# Patient Record
Sex: Female | Born: 1985 | Hispanic: No | Marital: Married | Smoking: Never smoker
Health system: Southern US, Community
[De-identification: ages and names within clinical notes are randomized; demographics above are authoritative.]

## PROBLEM LIST (undated history)

## (undated) ENCOUNTER — Emergency Department: Disposition: A | Discharge status: Home / Self Care | Payer: BLUE CROSS/BLUE SHIELD

## (undated) DIAGNOSIS — Z8632 Personal history of gestational diabetes: Secondary | ICD-10-CM

## (undated) DIAGNOSIS — T7840XA Allergy, unspecified, initial encounter: Secondary | ICD-10-CM

## (undated) DIAGNOSIS — E88819 Insulin resistance, unspecified: Secondary | ICD-10-CM

## (undated) DIAGNOSIS — O24419 Gestational diabetes mellitus in pregnancy, unspecified control: Secondary | ICD-10-CM

## (undated) DIAGNOSIS — Z789 Other specified health status: Secondary | ICD-10-CM

## (undated) DIAGNOSIS — R079 Chest pain, unspecified: Secondary | ICD-10-CM

## (undated) HISTORY — DX: Allergy, unspecified, initial encounter: T78.40XA

## (undated) HISTORY — DX: Body Mass Index (BMI) 40.0 and over, adult: Z684

## (undated) HISTORY — DX: Insulin resistance, unspecified: E88.819

## (undated) HISTORY — DX: Chest pain, unspecified: R07.9

## (undated) HISTORY — PX: NO PAST SURGERIES: SHX2092

## (undated) HISTORY — DX: Morbid (severe) obesity due to excess calories: E66.01

## (undated) HISTORY — DX: Personal history of gestational diabetes: Z86.32

---

## 2015-05-27 ENCOUNTER — Encounter (HOSPITAL_COMMUNITY): Payer: Self-pay | Admitting: *Deleted

## 2015-05-27 ENCOUNTER — Inpatient Hospital Stay (HOSPITAL_COMMUNITY)
Admission: AD | Admit: 2015-05-27 | Discharge: 2015-05-27 | Disposition: A | Discharge status: Home / Self Care | Payer: Medicaid Other | Source: Ambulatory Visit | Attending: Family Medicine | Admitting: Family Medicine

## 2015-05-27 DIAGNOSIS — N921 Excessive and frequent menstruation with irregular cycle: Secondary | ICD-10-CM | POA: Diagnosis present

## 2015-05-27 DIAGNOSIS — N939 Abnormal uterine and vaginal bleeding, unspecified: Secondary | ICD-10-CM | POA: Diagnosis not present

## 2015-05-27 HISTORY — DX: Other specified health status: Z78.9

## 2015-05-27 LAB — CBC WITH DIFFERENTIAL/PLATELET
BASOS ABS: 0 10*3/uL (ref 0.0–0.1)
BASOS PCT: 1 % (ref 0–1)
Eosinophils Absolute: 0.4 10*3/uL (ref 0.0–0.7)
Eosinophils Relative: 5 % (ref 0–5)
HEMATOCRIT: 38.6 % (ref 36.0–46.0)
HEMOGLOBIN: 12.4 g/dL (ref 12.0–15.0)
LYMPHS PCT: 32 % (ref 12–46)
Lymphs Abs: 2.6 10*3/uL (ref 0.7–4.0)
MCH: 26.5 pg (ref 26.0–34.0)
MCHC: 32.1 g/dL (ref 30.0–36.0)
MCV: 82.5 fL (ref 78.0–100.0)
MONO ABS: 0.5 10*3/uL (ref 0.1–1.0)
Monocytes Relative: 6 % (ref 3–12)
NEUTROS ABS: 4.8 10*3/uL (ref 1.7–7.7)
NEUTROS PCT: 56 % (ref 43–77)
Platelets: 214 10*3/uL (ref 150–400)
RBC: 4.68 MIL/uL (ref 3.87–5.11)
RDW: 14.4 % (ref 11.5–15.5)
WBC: 8.4 10*3/uL (ref 4.0–10.5)

## 2015-05-27 LAB — WET PREP, GENITAL
CLUE CELLS WET PREP: NONE SEEN
TRICH WET PREP: NONE SEEN
YEAST WET PREP: NONE SEEN

## 2015-05-27 LAB — URINE MICROSCOPIC-ADD ON

## 2015-05-27 LAB — URINALYSIS, ROUTINE W REFLEX MICROSCOPIC
Bilirubin Urine: NEGATIVE
GLUCOSE, UA: NEGATIVE mg/dL
KETONES UR: NEGATIVE mg/dL
NITRITE: NEGATIVE
PROTEIN: NEGATIVE mg/dL
Specific Gravity, Urine: 1.02 (ref 1.005–1.030)
Urobilinogen, UA: 0.2 mg/dL (ref 0.0–1.0)
pH: 7 (ref 5.0–8.0)

## 2015-05-27 LAB — POCT PREGNANCY, URINE: Preg Test, Ur: NEGATIVE

## 2015-05-27 NOTE — MAU Provider Note (Signed)
History     CSN: [id number]  Arrival date and time: 05/27/15 1644   First Provider Initiated Contact with Patient 05/27/15 1735      Chief Complaint  Patient presents with  . Metrorrhagia   HPI Ms. Brittany Fritz is a 29 y.o. G1P1 who presents to MAU today with complaint of vaginal bleeding x 17 days. The patient states that initially bleeding was very light and then she had heavy bleeding x 7 days. Today she denies vaginal bleeding. She also denies abdominal pain or fever.   OB History    Gravida Para Term Preterm AB TAB SAB Ectopic Multiple Living   Past Medical History  Diagnosis Date  . Medical history non-contributory     Past Surgical History  Procedure Laterality Date  . No past surgeries      History reviewed. No pertinent family history.  Social History  Substance Use Topics  . Smoking status: Never Smoker   . Smokeless tobacco: None  . Alcohol Use: No    Allergies:  Allergies  Allergen Reactions  . Penicillins Itching and Rash    No prescriptions prior to admission    Review of Systems  Constitutional: Negative for fever and malaise/fatigue.  Gastrointestinal: Negative for abdominal pain.  Genitourinary:       Neg - vaginal bleeding, discharge   Physical Exam   Blood pressure 117/74, pulse 94, temperature 98.9 F (37.2 C), resp. rate 18, height 5' 2.5" (1.588 m), weight 189 lb (85.73 kg), last menstrual period 05/10/2015.  Physical Exam  Nursing note and vitals reviewed. Constitutional: She is oriented to person, place, and time. She appears well-developed and well-nourished. No distress.  HENT:  Head: Normocephalic and atraumatic.  Cardiovascular: Normal rate.   Respiratory: Effort normal.  GI: Soft. She exhibits no distension and no mass. There is no tenderness. There is no rebound and no guarding.  Genitourinary: Uterus is not enlarged and not tender. Cervix exhibits no motion tenderness, no discharge and no  friability. Right adnexum displays no mass and no tenderness. Left adnexum displays no mass and no tenderness. There is bleeding (scant brown) in the vagina. Vaginal discharge (scant thin, light brown discharge) found.  Neurological: She is alert and oriented to person, place, and time.  Skin: Skin is warm and dry. No erythema.  Psychiatric: She has a normal mood and affect.   Results for orders placed or performed during the hospital encounter of 05/27/15 (from the past 24 hour(s))  Urinalysis, Routine w reflex microscopic (not at [facility])     Status: Abnormal   Collection Time: 05/27/15  5:15 PM  Result Value Ref Range   Color, Urine YELLOW YELLOW   APPearance HAZY (A) CLEAR   Specific Gravity, Urine 1.020 1.005 - 1.030   pH 7.0 5.0 - 8.0   Glucose, UA NEGATIVE NEGATIVE mg/dL   Hgb urine dipstick LARGE (A) NEGATIVE   Bilirubin Urine NEGATIVE NEGATIVE   Ketones, ur NEGATIVE NEGATIVE mg/dL   Protein, ur NEGATIVE NEGATIVE mg/dL   Urobilinogen, UA 0.2 0.0 - 1.0 mg/dL   Nitrite NEGATIVE NEGATIVE   Leukocytes, UA MODERATE (A) NEGATIVE  Urine microscopic-add on     Status: Abnormal   Collection Time: 05/27/15  5:15 PM  Result Value Ref Range   Squamous Epithelial / LPF MANY (A) RARE   WBC, UA 21-50 <3 WBC/hpf   RBC / HPF 3-6 <3 RBC/hpf  Bacteria, UA MANY (A) RARE  Pregnancy, urine POC     Status: None   Collection Time: 05/27/15  5:21 PM  Result Value Ref Range   Preg Test, Ur NEGATIVE NEGATIVE  CBC with Differential/Platelet     Status: None   Collection Time: 05/27/15  5:31 PM  Result Value Ref Range   WBC 8.4 4.0 - 10.5 K/uL   RBC 4.68 3.87 - 5.11 MIL/uL   Hemoglobin 12.4 12.0 - 15.0 g/dL   HCT 16.1 09.6 - 04.5 %   MCV 82.5 78.0 - 100.0 fL   MCH 26.5 26.0 - 34.0 pg   MCHC 32.1 30.0 - 36.0 g/dL   RDW 40.9 81.1 - 91.4 %   Platelets 214 150 - 400 K/uL   Neutrophils Relative % 56 43 - 77 %   Neutro Abs 4.8 1.7 - 7.7 K/uL   Lymphocytes Relative 32 12 - 46 %   Lymphs Abs 2.6 0.7  - 4.0 K/uL   Monocytes Relative 6 3 - 12 %   Monocytes Absolute 0.5 0.1 - 1.0 K/uL   Eosinophils Relative 5 0 - 5 %   Eosinophils Absolute 0.4 0.0 - 0.7 K/uL   Basophils Relative 1 0 - 1 %   Basophils Absolute 0.0 0.0 - 0.1 K/uL  Wet prep, genital     Status: Abnormal   Collection Time: 05/27/15  5:40 PM  Result Value Ref Range   Yeast Wet Prep HPF POC NONE SEEN NONE SEEN   Trich, Wet Prep NONE SEEN NONE SEEN   Clue Cells Wet Prep HPF POC NONE SEEN NONE SEEN   WBC, Wet Prep HPF POC MODERATE (A) NONE SEEN    MAU Course  Procedures None  MDM UPT - negative CBC, wet prep and GC/Chlamydia today Patient is hemodynamically stable. Since bleeding has stopped today will give contact information for [facility] if irregular bleeding continues.  Patient asymptomatic of UTI, urine sample with many epithelial cells, will culture and call if warrants treatment.  Assessment and Plan  A: Irregular bleeding  P: Discharge home Bleeding precautions discussed Patient advised to follow-up with [facility] as needed if symptoms persist or worsen Patient may return to MAU as needed or if her condition were to change or worsen  Marny Lowenstein, PA-C  05/27/2015, 6:15 PM

## 2015-05-27 NOTE — MAU Note (Signed)
[place] 7[place]

## 2015-05-27 NOTE — MAU Note (Addendum)
Problem with period.   Had period last month on the 27th, started bleeding again on 8/12, stopped yesterday. 21-20 was light. 21-27 was very heavy.  Passing clots.  Never had anything like this before

## 2015-05-27 NOTE — Discharge Instructions (Signed)
Abnormal Uterine Bleeding Abnormal uterine bleeding can affect women at various stages in life, including teenagers, women in their reproductive years, pregnant women, and women who have reached menopause. Several kinds of uterine bleeding are considered abnormal, including:  Bleeding or spotting between periods.   Bleeding after sexual intercourse.   Bleeding that is heavier or more than normal.   Periods that last longer than usual.  Bleeding after menopause.  Many cases of abnormal uterine bleeding are minor and simple to treat, while others are more serious. Any type of abnormal bleeding should be evaluated by your health care provider. Treatment will depend on the cause of the bleeding. HOME CARE INSTRUCTIONS Monitor your condition for any changes. The following actions may help to alleviate any discomfort you are experiencing:  Avoid the use of tampons and douches as directed by your health care provider.  Change your pads frequently. You should get regular pelvic exams and Pap tests. Keep all follow-up appointments for diagnostic tests as directed by your health care provider.  SEEK MEDICAL CARE IF:   Your bleeding lasts more than 1 week.   You feel dizzy at times.  SEEK IMMEDIATE MEDICAL CARE IF:   You pass out.   You are changing pads every 15 to 30 minutes.   You have abdominal pain.  You have a fever.   You become sweaty or weak.   You are passing large blood clots from the vagina.   You start to feel nauseous and vomit. MAKE SURE YOU:   Understand these instructions.  Will watch your condition.  Will get help right away if you are not doing well or get worse. Document Released: 09/14/2005 Document Revised: 09/19/2013 Document Reviewed: 04/13/2013 [employer] Patient Information 2015 [employer]. This information is not intended to replace advice given to you by your health care provider. Make sure you discuss any questions you have with your  health care provider.  

## 2015-05-28 LAB — GC/CHLAMYDIA PROBE AMP (~~LOC~~) NOT AT ARMC
CHLAMYDIA, DNA PROBE: NEGATIVE
NEISSERIA GONORRHEA: NEGATIVE

## 2015-05-28 LAB — URINE CULTURE

## 2015-10-30 ENCOUNTER — Encounter (HOSPITAL_COMMUNITY): Payer: Self-pay | Admitting: *Deleted

## 2015-10-30 ENCOUNTER — Inpatient Hospital Stay (HOSPITAL_COMMUNITY)
Admission: AD | Admit: 2015-10-30 | Discharge: 2015-10-30 | Disposition: A | Discharge status: Home / Self Care | Payer: Medicaid Other | Source: Ambulatory Visit | Attending: Obstetrics and Gynecology | Admitting: Obstetrics and Gynecology

## 2015-10-30 DIAGNOSIS — N939 Abnormal uterine and vaginal bleeding, unspecified: Secondary | ICD-10-CM | POA: Diagnosis present

## 2015-10-30 DIAGNOSIS — Z880 Allergy status to penicillin: Secondary | ICD-10-CM | POA: Diagnosis not present

## 2015-10-30 LAB — URINE MICROSCOPIC-ADD ON: Bacteria, UA: NONE SEEN

## 2015-10-30 LAB — CBC
HCT: 39.1 % (ref 36.0–46.0)
HEMOGLOBIN: 12.8 g/dL (ref 12.0–15.0)
MCH: 26.1 pg (ref 26.0–34.0)
MCHC: 32.7 g/dL (ref 30.0–36.0)
MCV: 79.8 fL (ref 78.0–100.0)
Platelets: 221 10*3/uL (ref 150–400)
RBC: 4.9 MIL/uL (ref 3.87–5.11)
RDW: 14.5 % (ref 11.5–15.5)
WBC: 8.2 10*3/uL (ref 4.0–10.5)

## 2015-10-30 LAB — URINALYSIS, ROUTINE W REFLEX MICROSCOPIC
Bilirubin Urine: NEGATIVE
GLUCOSE, UA: NEGATIVE mg/dL
KETONES UR: NEGATIVE mg/dL
Nitrite: NEGATIVE
PROTEIN: NEGATIVE mg/dL
Specific Gravity, Urine: 1.02 (ref 1.005–1.030)
pH: 7.5 (ref 5.0–8.0)

## 2015-10-30 LAB — WET PREP, GENITAL
Clue Cells Wet Prep HPF POC: NONE SEEN
SPERM: NONE SEEN
Trich, Wet Prep: NONE SEEN
Yeast Wet Prep HPF POC: NONE SEEN

## 2015-10-30 LAB — POCT PREGNANCY, URINE: PREG TEST UR: NEGATIVE

## 2015-10-30 NOTE — Discharge Instructions (Signed)
Abnormal Uterine Bleeding Abnormal uterine bleeding can affect women at various stages in life, including teenagers, women in their reproductive years, pregnant women, and women who have reached menopause. Several kinds of uterine bleeding are considered abnormal, including:  Bleeding or spotting between periods.   Bleeding after sexual intercourse.   Bleeding that is heavier or more than normal.   Periods that last longer than usual.  Bleeding after menopause.  Many cases of abnormal uterine bleeding are minor and simple to treat, while others are more serious. Any type of abnormal bleeding should be evaluated by your health care provider. Treatment will depend on the cause of the bleeding. HOME CARE INSTRUCTIONS Monitor your condition for any changes. The following actions may help to alleviate any discomfort you are experiencing:  Avoid the use of tampons and douches as directed by your health care provider.  Change your pads frequently. You should get regular pelvic exams and Pap tests. Keep all follow-up appointments for diagnostic tests as directed by your health care provider.  SEEK MEDICAL CARE IF:   Your bleeding lasts more than 1 week.   You feel dizzy at times.  SEEK IMMEDIATE MEDICAL CARE IF:   You pass out.   You are changing pads every 15 to 30 minutes.   You have abdominal pain.  You have a fever.   You become sweaty or weak.   You are passing large blood clots from the vagina.   You start to feel nauseous and vomit. MAKE SURE YOU:   Understand these instructions.  Will watch your condition.  Will get help right away if you are not doing well or get worse.   This information is not intended to replace advice given to you by your health care provider. Make sure you discuss any questions you have with your health care provider.   Document Released: 09/14/2005 Document Revised: 09/19/2013 Document Reviewed: 04/13/2013 Elsevier Interactive  Patient Education [employer] Infertility Infertility is when you are unable to get pregnant (conceive) after a year of having sex regularly without using birth control. Infertility can also mean that a woman is not able to carry a pregnancy to full term.  Both women and men can have fertility problems. WHAT CAUSES INFERTILITY? What Causes Infertility in Women? There are many possible causes of infertility in women. For some women, the cause of infertility is not known (unexplained infertility). Infertility can also be linked to more than one cause. Infertility problems in women can be caused by problems with the menstrual cycle or reproductive organs, certain medical conditions, and factors related to lifestyle and age.  Problems with your menstrual cycle can interfere with your ovaries producing eggs (ovulation). This can make it difficult to get pregnant. This includes having a menstrual cycle that is very long, very short, or irregular.  Problems with reproductive organs can include:  An abnormally narrow cervix or a cervix that does not remain closed during a pregnancy.  A blockage in your fallopian tubes.  An abnormally shaped uterus.  Uterine fibroids. This is a tissue mass (tumor) that can develop on your uterus.  Medical conditions that can affect a woman's fertility include:  Polycystic ovarian syndrome (PCOS). This is a hormonal disorder that can cause small cysts to grow on your ovaries. This is the most common cause of infertility in women.  Endometriosis. This is a condition in which the tissue that lines your uterus (endometrium) grows outside of its normal location.  Primary ovary insufficiency. This  is when your ovaries stop producing eggs and hormones before the age of 9.  Sexually transmitted diseases, such as chlamydia or gonorrhea. These infections can cause scarring in your fallopian tubes. This makes it difficult for eggs to reach your uterus.  Autoimmune  disorders. These are disorders in which your immune system attacks normal, healthy cells.  Hormone imbalances.  Other factors include:  Age. A woman's fertility declines with age, especially after her mid-30s.  Being under- or overweight.  Drinking too much alcohol.  Using drugs.  Exercising excessively.  Being exposed to environmental toxins, such as radiation, pesticides, and certain chemicals. What Causes Infertility in Men? There are many causes of infertility in men. Infertility can be linked to more than one cause. Infertility problems in men can be caused by problems with sperm or the reproductive organs, certain medical conditions, and factors related to lifestyle and age. Some men have unexplained infertility.   Problems with sperm. Infertility can result if there is a problem producing:  Enough sperm (low sperm count).  Enough normally-shaped sperm (sperm morphology).  Sperm that are able to reach the egg (poor motility).  Infertility can also be caused by:  A problem with hormones.  Enlarged veins (varicoceles), cysts (spermatoceles), or tumors of the testicles.  Sexual dysfunction.  Injury to the testicles.  A birth defect, such as not having the tubes that carry sperm (vas deferens).  Medical conditions that can affect a man's fertility include:  Diabetes.  Cancer treatments, such as chemotherapy or radiation.  Klinefelter syndrome. This is an inherited genetic disorder.   Thyroid problems, such as an under- or overactive thyroid.  Cystic fibrosis.  Sexually transmitted diseases.  Other factors include:  Age. A man's fertility declines with age.  Drinking too much alcohol.  Using drugs.  Being exposed to environmental toxins, such as pesticides and lead. WHAT ARE THE SYMPTOMS OF INFERTILITY? Being unable to get pregnant after one year of having regular sex without using birth control is the only sign of infertility.  HOW IS INFERTILITY  DIAGNOSED? In order to be diagnosed with infertility, both partners will have a physical exam. Both partners will also have an extensive medical and sexual history taken. If there is no obvious reason for infertility, additional tests may be done. What Tests Will Women Have? Women may first have tests to check whether they are ovulating each month. The tests may include:  Blood tests to check hormone levels.  An ultrasound of the ovaries. This looks for possible problems on or in the ovaries.  Taking a small sample of the tissue that lines the uterus for examination under a microscope (endometrial biopsy). Women who are ovulating may have additional tests. These may include:  Hysterosalpingography.  This is an X-ray of the fallopian tubes and uterus taken after a specific type of dye is injected.  This test can show the shape of the uterus and whether the fallopian tubes are open.  Laparoscopy.  In this test, a lighted tube (laparoscope) is used to look for problems in the fallopian tubes and other female organs.  Transvaginal ultrasound.  This is an imaging test to check for abnormalities of the uterus and ovaries.  A health care provider can use this test to count the number of follicles on the ovaries.  Hysteroscopy.  This test involves using a lighted tube to examine the cervix and inside the uterus.  It is done to find any abnormalities inside the uterus. What Tests Will Men Have?  Tests for men's infertility includes:  Semen tests to check sperm count, morphology, and motility.  Blood tests to check for hormone levels.  Taking a small sample of tissue from inside a testicle (biopsy). This is examined under a microscope.  Blood tests to check for genetic abnormalities (genetic testing). HOW ARE WOMEN TREATED FOR INFERTILITY?  Treatment depends on the cause of infertility. Most cases of infertility in women are treated with medicine or surgery.  Women may take  medicine to:  Correct ovulation problems.  Treat other health conditions, such as PCOS.  Surgery may be done to:  Repair damage to the ovaries, fallopian tubes, cervix, or uterus.  Remove growths from the uterus.  Remove scar tissue from the uterus, pelvis, or other female organs. HOW ARE MEN TREATED FOR INFERTILITY?  Treatment depends on the cause of infertility. Most cases of infertility in men are treated with medicine or surgery.   Men may take medicine to:  Correct hormone problems.  Treat other health conditions.  Treat sexual dysfunction.  Surgery may be done to:  Remove blockages in the reproductive tract.  Correct other structural problems of the reproductive tract. WHAT IS ASSISTED REPRODUCTIVE TECHNOLOGY? Assisted reproductive technology (ART) refers to all treatments and procedures that combine eggs and sperm outside the body to try to help a couple conceive. ART is often combined with fertility drugs to stimulate ovulation. Sometimes ART is done using eggs retrieved from another woman's body (donor eggs) or from previously frozen fertilized eggs (embryos).  There are different types of ART. These include:   Intrauterine insemination (IUI).  In this procedure, sperm is placed directly into a woman's uterus with a long, thin tube.  This may be most effective for infertility caused by sperm problems, including low sperm count and low motility.  Can be used in combination with fertility drugs.  In vitro fertilization (IVF).  This is often done when a woman's fallopian tubes are blocked or when a man has low sperm counts.  Fertility drugs stimulate the ovaries to produce multiple eggs. Once mature, these eggs are removed from the body and combined with the sperm to be fertilized.  These fertilized eggs are then placed in the woman's uterus.   This information is not intended to replace advice given to you by your health care provider. Make sure you discuss any  questions you have with your health care provider.   Document Released: 09/17/2003 Document Revised: 06/05/2015 Document Reviewed: 05/30/2014 Elsevier Interactive Patient Education [employer]

## 2015-10-30 NOTE — MAU Provider Note (Signed)
Chief Complaint: Vaginal Bleeding   First Provider Initiated Contact with Patient 10/30/15 1828      SUBJECTIVE HPI: Brittany Fritz is a 31 y.o. G1P1 who presents to maternity admissions reporting 2 periods in January which is unusual for her.  She recently moved here from [place] and desires conception.  She had her period January 25, then started bleeding again today.  She has a 44.28 year old but has not conceived since he was born. She took the hormone prolactin for a short time, stopping 7 months ago to encourage lactation and help with breastfeeding. She also recently received several vaccines, including Hep B, MMR, influenza, and TdaP, as part of obtaining her green card.  She is concerned that the vaccines may have caused her unusual period.   She denies vaginal itching/burning, urinary symptoms, h/a, dizziness, n/v, or fever/chills.     HPI  Past Medical History  Diagnosis Date  . Medical history non-contributory    Past Surgical History  Procedure Laterality Date  . No past surgeries     Social History   Social History  . Marital Status: Married    Spouse Name: N/A  . Number of Children: N/A  . Years of Education: N/A   Occupational History  . Not on file.   Social History Main Topics  . Smoking status: Never Smoker   . Smokeless tobacco: Not on file  . Alcohol Use: No  . Drug Use: No  . Sexual Activity: Yes   Other Topics Concern  . Not on file   Social History Narrative   No current facility-administered medications on file prior to encounter.   No current outpatient prescriptions on file prior to encounter.   Allergies  Allergen Reactions  . Penicillins Itching and Rash    Has patient had a PCN reaction causing immediate rash, facial/tongue/throat swelling, SOB or lightheadedness with hypotension: Yes Has patient had a PCN reaction causing severe rash involving mucus membranes or skin necrosis: No Has patient had a PCN reaction that required hospitalization  No Has patient had a PCN reaction occurring within the last 10 years: Yes If all of the above answers are "NO", then may proceed with Cephalosporin use.     ROS:  Review of Systems  Constitutional: Negative for fever, chills and fatigue.  Respiratory: Negative for shortness of breath.   Cardiovascular: Negative for chest pain.  Genitourinary: Positive for vaginal bleeding. Negative for dysuria, flank pain, vaginal discharge, difficulty urinating, vaginal pain and pelvic pain.  Neurological: Negative for dizziness and headaches.  Psychiatric/Behavioral: Negative.      I have reviewed patient's Past Medical Hx, Surgical Hx, Family Hx, Social Hx, medications and allergies.   Physical Exam  Patient Vitals for the past 24 hrs:  BP Temp Temp src Pulse Resp Height Weight  10/30/15 1857 129/70 mmHg 97.7 F (36.5 C) Oral 90 18 - -  10/30/15 1704 137/78 mmHg 97.7 F (36.5 C) Oral 91 17 5' 4.57" (1.64 m) 200 lb 9.6 oz (90.992 kg)   Constitutional: Well-developed, well-nourished female in no acute distress.  Cardiovascular: normal rate Respiratory: normal effort GI: Abd soft, non-tender. Pos BS x 4 MS: Extremities nontender, no edema, normal ROM Neurologic: Alert and oriented x 4.  GU: Neg CVAT.  PELVIC EXAM: Cervix pink, visually closed, without lesion, scant white creamy discharge, vaginal walls and external genitalia normal Bimanual exam: Cervix 0/long/high, firm, anterior, neg CMT, uterus nontender, nonenlarged, adnexa without tenderness, enlargement, or mass   LAB RESULTS Results  for orders placed or performed during the hospital encounter of 10/30/15 (from the past 24 hour(s))  Urinalysis, Routine w reflex microscopic (not at [facility])     Status: Abnormal   Collection Time: 10/30/15  5:08 PM  Result Value Ref Range   Color, Urine YELLOW YELLOW   APPearance HAZY (A) CLEAR   Specific Gravity, Urine 1.020 1.005 - 1.030   pH 7.5 5.0 - 8.0   Glucose, UA NEGATIVE NEGATIVE mg/dL    Hgb urine dipstick LARGE (A) NEGATIVE   Bilirubin Urine NEGATIVE NEGATIVE   Ketones, ur NEGATIVE NEGATIVE mg/dL   Protein, ur NEGATIVE NEGATIVE mg/dL   Nitrite NEGATIVE NEGATIVE   Leukocytes, UA MODERATE (A) NEGATIVE  Urine microscopic-add on     Status: Abnormal   Collection Time: 10/30/15  5:08 PM  Result Value Ref Range   Squamous Epithelial / LPF 0-5 (A) NONE SEEN   WBC, UA 0-5 0 - 5 WBC/hpf   RBC / HPF 6-30 0 - 5 RBC/hpf   Bacteria, UA NONE SEEN NONE SEEN  Pregnancy, urine POC     Status: None   Collection Time: 10/30/15  5:15 PM  Result Value Ref Range   Preg Test, Ur NEGATIVE NEGATIVE  CBC     Status: None   Collection Time: 10/30/15  6:26 PM  Result Value Ref Range   WBC 8.2 4.0 - 10.5 K/uL   RBC 4.90 3.87 - 5.11 MIL/uL   Hemoglobin 12.8 12.0 - 15.0 g/dL   HCT 39.1 36.0 - 46.0 %   MCV 79.8 78.0 - 100.0 fL   MCH 26.1 26.0 - 34.0 pg   MCHC 32.7 30.0 - 36.0 g/dL   RDW 14.5 11.5 - 15.5 %   Platelets 221 150 - 400 K/uL  Wet prep, genital     Status: Abnormal   Collection Time: 10/30/15  6:30 PM  Result Value Ref Range   Yeast Wet Prep HPF POC NONE SEEN NONE SEEN   Trich, Wet Prep NONE SEEN NONE SEEN   Clue Cells Wet Prep HPF POC NONE SEEN NONE SEEN   WBC, Wet Prep HPF POC MODERATE (A) NONE SEEN   Sperm NONE SEEN        IMAGING No results found.  MAU Management/MDM: Ordered labs and reviewed results.  Pt stable with no evidence of anemia or infection.  Discussed normal menstrual cycle and fertility with pt, recommend ovulation kit. She may f/u with Gyn provider as needed and fertility provider as desired.  Pt given list of OB/Gyn offices in [place].  Pt stable at time of discharge.  ASSESSMENT 1. Abnormal uterine bleeding (AUB)     PLAN Discharge home with bleeding precautions    Medication List    Notice    You have not been prescribed any medications.         Follow-up Information    Please follow up.   Why:  Recommend fertility or ovulation kit  , Return to MAU as needed for emergencies      Fatima Blank Certified Nurse-Midwife 10/30/2015  7:05 PM

## 2015-10-30 NOTE — MAU Note (Signed)
Pt presents to MAU with complaints of having irregular menstrual cycles. Had two cycles in January. Reports lower back and abdominal pain.

## 2015-10-31 LAB — GC/CHLAMYDIA PROBE AMP (~~LOC~~) NOT AT ARMC
CHLAMYDIA, DNA PROBE: NEGATIVE
NEISSERIA GONORRHEA: NEGATIVE

## 2015-10-31 LAB — HIV ANTIBODY (ROUTINE TESTING W REFLEX): HIV Screen 4th Generation wRfx: NONREACTIVE

## 2015-12-26 ENCOUNTER — Ambulatory Visit: Payer: Medicaid Other | Admitting: Certified Nurse Midwife

## 2016-01-23 ENCOUNTER — Encounter: Payer: Self-pay | Admitting: Certified Nurse Midwife

## 2016-01-23 ENCOUNTER — Ambulatory Visit (INDEPENDENT_AMBULATORY_CARE_PROVIDER_SITE_OTHER): Payer: Medicaid Other | Admitting: Certified Nurse Midwife

## 2016-01-23 VITALS — BP 111/76 | HR 80 | Temp 98.3°F | Resp 96 | Wt 204.0 lb

## 2016-01-23 DIAGNOSIS — N939 Abnormal uterine and vaginal bleeding, unspecified: Secondary | ICD-10-CM

## 2016-01-23 DIAGNOSIS — Z0000 Encounter for general adult medical examination without abnormal findings: Secondary | ICD-10-CM | POA: Diagnosis not present

## 2016-01-23 DIAGNOSIS — Z01419 Encounter for gynecological examination (general) (routine) without abnormal findings: Secondary | ICD-10-CM | POA: Diagnosis not present

## 2016-01-23 DIAGNOSIS — Z603 Acculturation difficulty: Secondary | ICD-10-CM | POA: Insufficient documentation

## 2016-01-23 DIAGNOSIS — Z3169 Encounter for other general counseling and advice on procreation: Secondary | ICD-10-CM | POA: Insufficient documentation

## 2016-01-23 DIAGNOSIS — Z7689 Persons encountering health services in other specified circumstances: Secondary | ICD-10-CM

## 2016-01-23 DIAGNOSIS — Z1389 Encounter for screening for other disorder: Secondary | ICD-10-CM

## 2016-01-23 DIAGNOSIS — R319 Hematuria, unspecified: Secondary | ICD-10-CM

## 2016-01-23 LAB — POCT URINALYSIS DIPSTICK
BILIRUBIN UA: NEGATIVE
GLUCOSE UA: NEGATIVE
Ketones, UA: NEGATIVE
Nitrite, UA: NEGATIVE
PH UA: 5
Protein, UA: NEGATIVE
RBC UA: 50
SPEC GRAV UA: 1.015
Urobilinogen, UA: NEGATIVE

## 2016-01-23 MED ORDER — VITAFOL FE+ 90-1-200 & 50 MG PO CPPK
2.0000 | ORAL_CAPSULE | Freq: Every day | ORAL | Status: DC
Start: 2016-01-23 — End: 2016-11-17

## 2016-01-23 NOTE — Progress Notes (Signed)
Patient ID: Brittany Fritz, female   DOB: [birth year], 30 y.o.   MRN: [id number]    Subjective:      Brittany Fritz is a 30 y.o. female here for a routine exam. Here for exam with interpreter.  Current complaints: infertility.  Has a hx of AUB with heavy period in February.  Up to date on immunizations.  Periods are monthly lasting 5 days, denies cramping, does have small clots and heavy bleeding.  Has been trying to get pregnant for 4 months, discussed ways to encourage successful pregnancy.   Patients spouse had sperm count done in [place] and it was normal.  LMP: 12/24/15.    Recent immigration to the [place] from [place], was in [place] for 4 years; [place] refugees.     Personal health questionnaire:  Is patient Ashkenazi Jewish, have a family history of breast and/or ovarian cancer: no Is there a family history of uterine cancer diagnosed at age < 38, gastrointestinal cancer, urinary tract cancer, family member who is a Personnel officer syndrome-associated carrier: no Is the patient overweight and hypertensive, family history of diabetes, personal history of gestational diabetes, preeclampsia or PCOS: yes Is patient over 8, have PCOS,  family history of premature CHD under age 73, diabetes, smoke, have hypertension or peripheral artery disease:  no At any time, has a partner hit, kicked or otherwise hurt or frightened you?: no Over the past 2 weeks, have you felt down, depressed or hopeless?: no Over the past 2 weeks, have you felt little interest or pleasure in doing things?:no   Gynecologic History No LMP recorded. Contraception: none, desires pregnancy Last Pap: unknown. Results were: unknown Last mammogram: N/A.   Obstetric History OB History  Gravida Para Term Preterm AB SAB TAB Ectopic Multiple Living  # Outcome Date GA Lbr Len/2nd Weight Sex Delivery Anes PTL Lv  1 Para             Delivered in 2014 in [place]  Past Medical History  Diagnosis Date  . Medical history  non-contributory   . Allergy     Seasonal    Past Surgical History  Procedure Laterality Date  . No past surgeries      No current outpatient prescriptions on file. Allergies  Allergen Reactions  . Penicillins Itching and Rash    Has patient had a PCN reaction causing immediate rash, facial/tongue/throat swelling, SOB or lightheadedness with hypotension: Yes Has patient had a PCN reaction causing severe rash involving mucus membranes or skin necrosis: No Has patient had a PCN reaction that required hospitalization No Has patient had a PCN reaction occurring within the last 10 years: Yes If all of the above answers are "NO", then may proceed with Cephalosporin use.     Social History  Substance Use Topics  . Smoking status: Never Smoker   . Smokeless tobacco: Not on file  . Alcohol Use: No    Family History  Problem Relation Age of Onset  . Heart disease Father   . Stroke Father       Review of Systems  Constitutional: negative for fatigue and weight loss Respiratory: negative for cough and wheezing Cardiovascular: negative for chest pain, fatigue and palpitations Gastrointestinal: negative for abdominal pain and change in bowel habits Musculoskeletal:negative for myalgias Neurological: negative for gait problems and tremors Behavioral/Psych: negative for abusive relationship, depression Endocrine: negative for temperature intolerance   Genitourinary:negative for abnormal menstrual periods, genital lesions,  hot flashes, sexual problems and vaginal discharge Integument/breast: negative for breast lump, breast tenderness, nipple discharge and skin lesion(s)    Objective:       BP 111/76 mmHg  Pulse 80  Temp(Src) 98.3 F (36.8 C)  Resp 96  Wt 204 lb (92.534 kg) General:   alert  Skin:   no rash or abnormalities  Lungs:   clear to auscultation bilaterally  Heart:   regular rate and rhythm, S1, S2 normal, no murmur, click, rub or gallop  Breasts:   normal without  suspicious masses, skin or nipple changes or axillary nodes  Abdomen:  normal findings: no organomegaly, soft, non-tender and no hernia obese  Pelvis:  External genitalia: normal general appearance Urinary system: urethral meatus normal and bladder without fullness, nontender Vaginal: normal without tenderness, induration or masses Cervix: normal appearance Adnexa: normal bimanual exam Uterus: anteverted and non-tender, normal size   Lab Review Urine pregnancy test Labs reviewed no Radiologic studies reviewed no  50% of 45 min visit spent on counseling and coordination of care.   Assessment:    Healthy female exam.   Preconception counseling  AUB Plan:    Education reviewed: calcium supplements, depression evaluation, low fat, low cholesterol diet, safe sex/STD prevention, self breast exams, skin cancer screening and weight bearing exercise. Contraception: none. Follow up in: 1 month, after US.   No orders of the defined types were placed in this encounter.   Orders Placed This Encounter  Procedures  . POCT urinalysis dipstick    Possible management options include: Infertility consult in a few months Follow up as needed.

## 2016-01-24 LAB — COMPREHENSIVE METABOLIC PANEL
ALBUMIN: 4.4 g/dL (ref 3.5–5.5)
ALT: 12 IU/L (ref 0–32)
AST: 15 IU/L (ref 0–40)
Albumin/Globulin Ratio: 1.5 (ref 1.2–2.2)
Alkaline Phosphatase: 102 IU/L (ref 39–117)
BUN/Creatinine Ratio: 10 (ref 9–23)
BUN: 6 mg/dL (ref 6–20)
Bilirubin Total: 0.3 mg/dL (ref 0.0–1.2)
CALCIUM: 9.2 mg/dL (ref 8.7–10.2)
CO2: 24 mmol/L (ref 18–29)
CREATININE: 0.62 mg/dL (ref 0.57–1.00)
Chloride: 97 mmol/L (ref 96–106)
GFR calc Af Amer: 143 mL/min/{1.73_m2} (ref >59)
GFR, EST NON AFRICAN AMERICAN: 124 mL/min/{1.73_m2} (ref >59)
GLOBULIN, TOTAL: 3 g/dL (ref 1.5–4.5)
Glucose: 82 mg/dL (ref 65–99)
Potassium: 4.3 mmol/L (ref 3.5–5.2)
SODIUM: 141 mmol/L (ref 134–144)
Total Protein: 7.4 g/dL (ref 6.0–8.5)

## 2016-01-24 LAB — CBC WITH DIFFERENTIAL/PLATELET
BASOS: 0 %
Basophils Absolute: 0 10*3/uL (ref 0.0–0.2)
EOS (ABSOLUTE): 0.3 10*3/uL (ref 0.0–0.4)
EOS: 5 %
HEMATOCRIT: 40.9 % (ref 34.0–46.6)
HEMOGLOBIN: 13.1 g/dL (ref 11.1–15.9)
IMMATURE GRANULOCYTES: 0 %
Immature Grans (Abs): 0 10*3/uL (ref 0.0–0.1)
Lymphocytes Absolute: 1.6 10*3/uL (ref 0.7–3.1)
Lymphs: 24 %
MCH: 25.3 pg — ABNORMAL LOW (ref 26.6–33.0)
MCHC: 32 g/dL (ref 31.5–35.7)
MCV: 79 fL (ref 79–97)
Monocytes Absolute: 0.4 10*3/uL (ref 0.1–0.9)
Monocytes: 6 %
Neutrophils Absolute: 4.4 10*3/uL (ref 1.4–7.0)
Neutrophils: 65 %
Platelets: 218 10*3/uL (ref 150–379)
RBC: 5.18 x10E6/uL (ref 3.77–5.28)
RDW: 16.4 % — AB (ref 12.3–15.4)
WBC: 6.8 10*3/uL (ref 3.4–10.8)

## 2016-01-24 LAB — HDL CHOLESTEROL: HDL: 58 mg/dL (ref >39)

## 2016-01-24 LAB — CHOLESTEROL, TOTAL: CHOLESTEROL TOTAL: 195 mg/dL (ref 100–199)

## 2016-01-24 LAB — TSH: TSH: 1.7 u[IU]/mL (ref 0.450–4.500)

## 2016-01-24 LAB — PROGESTERONE: Progesterone: 2.7 ng/mL

## 2016-01-24 LAB — FSH/LH
FSH: 3.9 m[IU]/mL
LH: 12 m[IU]/mL

## 2016-01-24 LAB — TRIGLYCERIDES: Triglycerides: 107 mg/dL (ref 0–149)

## 2016-01-25 LAB — URINE CULTURE

## 2016-01-26 LAB — NUSWAB VAGINITIS PLUS (VG+)
CANDIDA GLABRATA, NAA: POSITIVE — AB
Candida albicans, NAA: POSITIVE — AB
Chlamydia trachomatis, NAA: NEGATIVE
Neisseria gonorrhoeae, NAA: NEGATIVE
TRICH VAG BY NAA: NEGATIVE

## 2016-01-26 LAB — PAP IG W/ RFLX HPV ASCU: PAP Smear Comment: 0

## 2016-01-28 ENCOUNTER — Encounter: Payer: Self-pay | Admitting: *Deleted

## 2016-01-28 ENCOUNTER — Other Ambulatory Visit: Payer: Self-pay | Admitting: Certified Nurse Midwife

## 2016-01-28 DIAGNOSIS — B373 Candidiasis of vulva and vagina: Secondary | ICD-10-CM

## 2016-01-28 DIAGNOSIS — B3731 Acute candidiasis of vulva and vagina: Secondary | ICD-10-CM

## 2016-01-28 MED ORDER — FLUCONAZOLE 100 MG PO TABS: 100.0000 mg | ORAL_TABLET | Freq: Once | ORAL | Status: DC

## 2016-01-28 MED ORDER — TERCONAZOLE 0.4 % VA CREA: 1.0000 | TOPICAL_CREAM | Freq: Every day | VAGINAL | Status: DC

## 2016-02-06 ENCOUNTER — Ambulatory Visit (INDEPENDENT_AMBULATORY_CARE_PROVIDER_SITE_OTHER): Payer: Medicaid Other

## 2016-02-06 ENCOUNTER — Ambulatory Visit (INDEPENDENT_AMBULATORY_CARE_PROVIDER_SITE_OTHER): Payer: Medicaid Other | Admitting: Certified Nurse Midwife

## 2016-02-06 VITALS — BP 106/71 | HR 71 | Wt 197.0 lb

## 2016-02-06 DIAGNOSIS — Z3169 Encounter for other general counseling and advice on procreation: Secondary | ICD-10-CM

## 2016-02-06 DIAGNOSIS — N939 Abnormal uterine and vaginal bleeding, unspecified: Secondary | ICD-10-CM | POA: Diagnosis not present

## 2016-02-06 DIAGNOSIS — Z319 Encounter for procreative management, unspecified: Secondary | ICD-10-CM

## 2016-02-06 DIAGNOSIS — Z7689 Persons encountering health services in other specified circumstances: Secondary | ICD-10-CM

## 2016-02-06 MED ORDER — CLOMIPHENE CITRATE 50 MG PO TABS: 50.0000 mg | ORAL_TABLET | Freq: Every day | ORAL | Status: DC

## 2016-02-07 ENCOUNTER — Encounter: Payer: Self-pay | Admitting: Certified Nurse Midwife

## 2016-02-07 NOTE — Progress Notes (Signed)
Patient ID: Brittany Fritz, female   DOB: 08/06/1986, 30 y.o.   MRN: [id number]  Chief Complaint  Patient presents with  . Follow-up    u/s results    HPI Brittany CollegeSarah Fritz is a 30 y.o. female.  Here for f/u on US results.  Reports regular periods.  Last period about 10 days ago.  Encouraged regular sexual intercourse.  States has been trying to get pregnant since living in [place]; roughly 1.5 years.  Spouse has had a normal sperm count.  Desires fertility treatment.  Fertility treatment protocol discussed at length with patient and spouse via Nurse, learning disabilitytranslator.  Discussed that most likely she will ovulate on Mother's day this month.    Pelvic US: normal pelvic US, resolving follicular cyst.     HPI  Past Medical History  Diagnosis Date  . Medical history non-contributory   . Allergy     Seasonal    Past Surgical History  Procedure Laterality Date  . No past surgeries      Family History  Problem Relation Age of Onset  . Heart disease Father   . Stroke Father     Social History Social History  Substance Use Topics  . Smoking status: Never Smoker   . Smokeless tobacco: Not on file  . Alcohol Use: No    Allergies  Allergen Reactions  . Penicillins Itching and Rash    Has patient had a PCN reaction causing immediate rash, facial/tongue/throat swelling, SOB or lightheadedness with hypotension: Yes Has patient had a PCN reaction causing severe rash involving mucus membranes or skin necrosis: No Has patient had a PCN reaction that required hospitalization No Has patient had a PCN reaction occurring within the last 10 years: Yes If all of the above answers are "NO", then may proceed with Cephalosporin use.     Current Outpatient Prescriptions  Medication Sig Dispense Refill  . clomiPHENE (CLOMID) 50 MG tablet Take 1 tablet (50 mg total) by mouth daily. Starting on day 3 of your period and continue for 5 days. 5 tablet 3  . fluconazole (DIFLUCAN) 100 MG tablet Take 1 tablet (100 mg  total) by mouth once. Repeat dose in 48-72 hour. 3 tablet 0  . Prenat-FePoly-Metf-FA-DHA-DSS (VITAFOL FE+) 90-1-200 & 50 MG CPPK Take 2 tablets by mouth daily. 60 each 12  . terconazole (TERAZOL 7) 0.4 % vaginal cream Place 1 applicator vaginally at bedtime. 45 g 0   No current facility-administered medications for this visit.    Review of Systems Review of Systems Constitutional: negative for fatigue and weight loss Respiratory: negative for cough and wheezing Cardiovascular: negative for chest pain, fatigue and palpitations Gastrointestinal: negative for abdominal pain and change in bowel habits Genitourinary:negative Integument/breast: negative for nipple discharge Musculoskeletal:negative for myalgias Neurological: negative for gait problems and tremors Behavioral/Psych: negative for abusive relationship, depression Endocrine: negative for temperature intolerance     Blood pressure 106/71, pulse 71, weight 197 lb (89.359 kg), last menstrual period 01/24/2016.  Physical Exam Physical Exam: deferred   50% of 30 min visit spent on counseling and coordination of care.   Data Reviewed Previous medical hx, meds, labs, ultrasound results  Assessment     Infertility with regular menses, normal labs and US     Plan   To start Clomid with next menses cycle. No orders of the defined types were placed in this encounter.   Meds ordered this encounter  Medications  . clomiPHENE (CLOMID) 50 MG tablet    Sig: Take 1 tablet (  50 mg total) by mouth daily. Starting on day 3 of your period and continue for 5 days.    Dispense:  5 tablet    Refill:  3    Possible management options include: Infertility consult with Dr. Jeannie Fend Follow up in 3 months or sooner if no pregnancy.

## 2016-03-27 ENCOUNTER — Inpatient Hospital Stay (HOSPITAL_COMMUNITY)
Admission: AD | Admit: 2016-03-27 | Discharge: 2016-03-27 | Disposition: A | Discharge status: Home / Self Care | Payer: Medicaid Other | Source: Ambulatory Visit | Attending: Obstetrics | Admitting: Obstetrics

## 2016-03-27 ENCOUNTER — Other Ambulatory Visit (INDEPENDENT_AMBULATORY_CARE_PROVIDER_SITE_OTHER): Payer: Medicaid Other

## 2016-03-27 ENCOUNTER — Inpatient Hospital Stay (HOSPITAL_COMMUNITY): Payer: Medicaid Other

## 2016-03-27 ENCOUNTER — Encounter (HOSPITAL_COMMUNITY): Payer: Self-pay | Admitting: *Deleted

## 2016-03-27 DIAGNOSIS — Z3A01 Less than 8 weeks gestation of pregnancy: Secondary | ICD-10-CM | POA: Insufficient documentation

## 2016-03-27 DIAGNOSIS — Z3201 Encounter for pregnancy test, result positive: Secondary | ICD-10-CM

## 2016-03-27 DIAGNOSIS — M549 Dorsalgia, unspecified: Secondary | ICD-10-CM | POA: Insufficient documentation

## 2016-03-27 DIAGNOSIS — Z3200 Encounter for pregnancy test, result unknown: Secondary | ICD-10-CM

## 2016-03-27 DIAGNOSIS — O26891 Other specified pregnancy related conditions, first trimester: Secondary | ICD-10-CM | POA: Diagnosis not present

## 2016-03-27 DIAGNOSIS — O4691 Antepartum hemorrhage, unspecified, first trimester: Secondary | ICD-10-CM | POA: Diagnosis not present

## 2016-03-27 DIAGNOSIS — Z880 Allergy status to penicillin: Secondary | ICD-10-CM | POA: Insufficient documentation

## 2016-03-27 DIAGNOSIS — O209 Hemorrhage in early pregnancy, unspecified: Secondary | ICD-10-CM | POA: Diagnosis not present

## 2016-03-27 LAB — CBC
HCT: 38.8 % (ref 36.0–46.0)
Hemoglobin: 12.9 g/dL (ref 12.0–15.0)
MCH: 26.5 pg (ref 26.0–34.0)
MCHC: 33.2 g/dL (ref 30.0–36.0)
MCV: 79.7 fL (ref 78.0–100.0)
Platelets: 219 10*3/uL (ref 150–400)
RBC: 4.87 MIL/uL (ref 3.87–5.11)
RDW: 15.1 % (ref 11.5–15.5)
WBC: 7.3 10*3/uL (ref 4.0–10.5)

## 2016-03-27 LAB — POCT URINE PREGNANCY
PREG TEST UR: NEGATIVE
Preg Test, Ur: POSITIVE — AB

## 2016-03-27 LAB — ABO/RH: ABO/RH(D): B POS

## 2016-03-27 LAB — HCG, QUANTITATIVE, PREGNANCY: HCG, BETA CHAIN, QUANT, S: 27 m[IU]/mL — AB (ref <5)

## 2016-03-27 NOTE — MAU Note (Signed)
This morning, saw some blood.  Went to dr, did some tests, was told she  Is one month- was told if she saw blood again to come in.

## 2016-03-27 NOTE — MAU Note (Signed)
Urine in lab 

## 2016-03-27 NOTE — Discharge Instructions (Signed)

## 2016-03-27 NOTE — MAU Provider Note (Signed)
History     CSN: [id number]  Arrival date and time: 03/27/16 1818   None     Chief Complaint  Patient presents with  . Vaginal Bleeding  . Back Pain   HPI Pt is early pregnant, 7218w1d with positive UPT in office today. Pt started spotting this afternoon and was told to come in for evaluation.  Pt denies cramping RN note:  Expand All Collapse All   This morning, saw some blood. Went to dr, did some tests, was told she Is one month- was told if she saw blood again to come in.          Past Medical History  Diagnosis Date  . Medical history non-contributory   . Allergy     Seasonal    Past Surgical History  Procedure Laterality Date  . No past surgeries      Family History  Problem Relation Age of Onset  . Heart disease Father   . Stroke Father     Social History  Substance Use Topics  . Smoking status: Never Smoker   . Smokeless tobacco: Not on file  . Alcohol Use: No    Allergies:  Allergies  Allergen Reactions  . Penicillins Itching and Rash    Has patient had a PCN reaction causing immediate rash, facial/tongue/throat swelling, SOB or lightheadedness with hypotension: Yes Has patient had a PCN reaction causing severe rash involving mucus membranes or skin necrosis: No Has patient had a PCN reaction that required hospitalization No Has patient had a PCN reaction occurring within the last 10 years: Yes If all of the above answers are "NO", then may proceed with Cephalosporin use.     Prescriptions prior to admission  Medication Sig Dispense Refill Last Dose  . clomiPHENE (CLOMID) 50 MG tablet Take 1 tablet (50 mg total) by mouth daily. Starting on day 3 of your period and continue for 5 days. 5 tablet 3   . fluconazole (DIFLUCAN) 100 MG tablet Take 1 tablet (100 mg total) by mouth once. Repeat dose in 48-72 hour. 3 tablet 0   . Prenat-FePoly-Metf-FA-DHA-DSS (VITAFOL FE+) 90-1-200 & 50 MG CPPK Take 2 tablets by mouth daily. 60 each 12   .  terconazole (TERAZOL 7) 0.4 % vaginal cream Place 1 applicator vaginally at bedtime. 45 g 0     Review of Systems  Constitutional: Negative for fever and chills.  Gastrointestinal: Negative for nausea, vomiting and abdominal pain.   Physical Exam  Vital signs normal BP 116/72 pulse 99 resp 16 temp 98.80F There were no vitals taken for this visit.  Physical Exam  Vitals reviewed. Constitutional: She appears well-developed and well-nourished. No distress.  HENT:  Head: Normocephalic.  Eyes: Pupils are equal, round, and reactive to light.  Neck: Normal range of motion.  Cardiovascular: Normal rate.   Respiratory: Effort normal.  GI: Soft.  Musculoskeletal: Normal range of motion.  Neurological: She is alert.  Skin: Skin is warm and dry.  Psychiatric: She has a normal mood and affect.    MAU Course  Procedures Results for orders placed or performed during the hospital encounter of 03/27/16 (from the past 24 hour(s))  CBC     Status: None   Collection Time: 03/27/16  7:02 PM  Result Value Ref Range   WBC 7.3 4.0 - 10.5 K/uL   RBC 4.87 3.87 - 5.11 MIL/uL   Hemoglobin 12.9 12.0 - 15.0 g/dL   HCT 30.838.8 65.736.0 - 84.646.0 %   MCV 79.7 78.0 -  100.0 fL   MCH 26.5 26.0 - 34.0 pg   MCHC 33.2 30.0 - 36.0 g/dL   RDW 69.615.1 29.511.5 - 28.415.5 %   Platelets 219 150 - 400 K/uL   Results for orders placed or performed during the hospital encounter of 03/27/16 (from the past 24 hour(s))  hCG, quantitative, pregnancy     Status: Abnormal   Collection Time: 03/27/16  7:02 PM  Result Value Ref Range   hCG, Beta Chain, Quant, S 27 (H) <5 mIU/mL  CBC     Status: None   Collection Time: 03/27/16  7:02 PM  Result Value Ref Range   WBC 7.3 4.0 - 10.5 K/uL   RBC 4.87 3.87 - 5.11 MIL/uL   Hemoglobin 12.9 12.0 - 15.0 g/dL   HCT 13.238.8 44.036.0 - 10.246.0 %   MCV 79.7 78.0 - 100.0 fL   MCH 26.5 26.0 - 34.0 pg   MCHC 33.2 30.0 - 36.0 g/dL   RDW 72.515.1 36.611.5 - 44.015.5 %   Platelets 219 150 - 400 K/uL  ABO/Rh     Status:  None   Collection Time: 03/27/16  7:02 PM  Result Value Ref Range   ABO/RH(D) B POS   US- not able to see GS, YS Pt refused transvaginal US fearing damage to baby discussed with Brittany Fritz, CNM Explained tro pt and partner through interpreter that pt's HCG was very low and needs to return on Monday to repeat level to determine if baby is growing properly or if baby is not growing. Pt to return sooner if increase in pain or heavy bleeding Assessment and Plan  Vaginal bleeding in pregnancy Pregnancy of unknown location- repeat HCG on Monday July 3 Return sooner if increase in pain or bleeding Brittany Fritz 03/27/2016, 6:42 PM

## 2016-03-28 ENCOUNTER — Encounter (HOSPITAL_COMMUNITY): Payer: Self-pay | Admitting: *Deleted

## 2016-03-28 ENCOUNTER — Inpatient Hospital Stay (HOSPITAL_COMMUNITY)
Admission: AD | Admit: 2016-03-28 | Discharge: 2016-03-28 | Disposition: A | Discharge status: Home / Self Care | Payer: Medicaid Other | Source: Ambulatory Visit | Attending: Family Medicine | Admitting: Family Medicine

## 2016-03-28 ENCOUNTER — Inpatient Hospital Stay (HOSPITAL_COMMUNITY): Payer: Medicaid Other

## 2016-03-28 DIAGNOSIS — Z880 Allergy status to penicillin: Secondary | ICD-10-CM | POA: Insufficient documentation

## 2016-03-28 DIAGNOSIS — R109 Unspecified abdominal pain: Secondary | ICD-10-CM | POA: Insufficient documentation

## 2016-03-28 DIAGNOSIS — Z3A01 Less than 8 weeks gestation of pregnancy: Secondary | ICD-10-CM | POA: Insufficient documentation

## 2016-03-28 DIAGNOSIS — O4691 Antepartum hemorrhage, unspecified, first trimester: Secondary | ICD-10-CM | POA: Insufficient documentation

## 2016-03-28 DIAGNOSIS — O200 Threatened abortion: Secondary | ICD-10-CM | POA: Diagnosis present

## 2016-03-28 LAB — BETA HCG QUANT (REF LAB): hCG Quant: 24 m[IU]/mL

## 2016-03-28 NOTE — MAU Provider Note (Signed)
History   G2P0010 @ 5.2 wks in with spotting in preg. Was seen yesterday and quant was 27, u/s unremarkable, no ectopic seen. Pt concerned since she was still bleeding.  CSN: [id number]  Arrival date & time 03/28/16  [id number]   First Provider Initiated Contact with Patient 03/28/16 2059      Chief Complaint  Patient presents with  . Vaginal Bleeding  . Abdominal Pain    HPI  Past Medical History  Diagnosis Date  . Medical history non-contributory   . Allergy     Seasonal    Past Surgical History  Procedure Laterality Date  . No past surgeries      Family History  Problem Relation Age of Onset  . Heart disease Father   . Stroke Father     Social History  Substance Use Topics  . Smoking status: Never Smoker   . Smokeless tobacco: None  . Alcohol Use: No    OB History    Gravida Para Term Preterm AB TAB SAB Ectopic Multiple Living   2 0   1  1   1       Review of Systems  Constitutional: Negative.   HENT: Negative.   Eyes: Negative.   Respiratory: Negative.   Cardiovascular: Negative.   Gastrointestinal: Negative.   Genitourinary: Positive for vaginal bleeding.  Musculoskeletal: Negative.   Skin: Negative.   Allergic/Immunologic: Negative.   Neurological: Negative.   Hematological: Negative.   Psychiatric/Behavioral: Negative.     Allergies  Penicillins  Home Medications  No current outpatient prescriptions on file.  BP 130/75 mmHg  Pulse 104  Temp(Src) 98.6 F (37 C)  Resp 18  Ht 5\' 3"  (1.6 m)  Wt 195 lb (88.451 kg)  BMI 34.55 kg/m2  LMP 02/20/2016  Physical Exam  Constitutional: She is oriented to person, place, and time. She appears well-developed and well-nourished.  HENT:  Head: Normocephalic.  Eyes: Pupils are equal, round, and reactive to light.  Neck: Normal range of motion.  Cardiovascular: Normal rate, regular rhythm, normal heart sounds and intact distal pulses.   Pulmonary/Chest: Effort normal and breath sounds normal.   Abdominal: Soft. Bowel sounds are normal.  Genitourinary: Vagina normal.  Musculoskeletal: Normal range of motion.  Neurological: She is alert and oriented to person, place, and time. She has normal reflexes.  Skin: Skin is warm and dry.  Psychiatric: She has a normal mood and affect. Her behavior is normal. Judgment and thought content normal.    MAU Course  Procedures (including critical care time)  Labs Reviewed - No data to display [place] Ob Comp Less 14 Wks  03/27/2016  CLINICAL DATA:  Vaginal bleeding during first trimester pregnancy. Gestational age by LMP of 5 weeks 1 day. EXAM: OBSTETRIC <14 WK ULTRASOUND TECHNIQUE: Transabdominal ultrasound was performed for evaluation of the gestation as well as the maternal uterus and adnexal regions. The patient refused transvaginal sonography. COMPARISON:  None. FINDINGS: No intrauterine gestational sac or other fluid collection visualized in endometrial cavity by transabdominal sonography. Endometrial thickness measures 17 mm. No fibroids identified. Both ovaries are normal in appearance. No adnexal mass or free fluid identified. IMPRESSION: Pregnancy location not visualized sonographically. Differential diagnosis includes recent spontaneous abortion, IUP too early to visualize, and non-visualized ectopic pregnancy. Recommend close follow up of quantitative B-HCG levels, and follow up US as clinically warranted. Electronically Signed   By: Myles RosenthalJohn  Stahl M.D.   On: 03/27/2016 20:04     1. Threatened abortion in early pregnancy  MDM  Dx: threat ab Plan: lengthy discussion with pt via intrepeter and discussed findings of yesterdays labs and u/s and since pt was just spotting that it would glean any useful information to repeat labs or u/s. Pt verbalized understanding and states will return tomorrow for repeat labs. Will d/c home to return if bleeding worsenes.

## 2016-03-28 NOTE — MAU Note (Signed)
Urine sent to the lab

## 2016-03-28 NOTE — Discharge Instructions (Signed)

## 2016-03-28 NOTE — MAU Note (Signed)
Was seen here yesterday with small amt vag bleeding. Today alittle more blood and red. Some lower back pain and lower stomach is "hard"

## 2016-03-29 ENCOUNTER — Inpatient Hospital Stay (HOSPITAL_COMMUNITY)
Admission: AD | Admit: 2016-03-29 | Discharge: 2016-03-29 | Disposition: A | Discharge status: Home / Self Care | Payer: Medicaid Other | Source: Ambulatory Visit | Attending: Obstetrics & Gynecology | Admitting: Obstetrics & Gynecology

## 2016-03-29 DIAGNOSIS — O039 Complete or unspecified spontaneous abortion without complication: Secondary | ICD-10-CM | POA: Diagnosis present

## 2016-03-29 LAB — HCG, QUANTITATIVE, PREGNANCY: hCG, Beta Chain, Quant, S: 16 m[IU]/mL — ABNORMAL HIGH (ref <5)

## 2016-03-29 NOTE — Discharge Instructions (Signed)
Miscarriage °A miscarriage is the loss of an unborn baby (fetus) before the 20th week of pregnancy. The cause is often unknown.  °HOME CARE °· You may need to stay in bed (bed rest), or you may be able to do light activity. Go about activity as told by your doctor. °· Have help at home. °· Write down how many pads you use each day. Write down how soaked they are. °· Do not use tampons. Do not wash out your vagina (douche) or have sex (intercourse) until your doctor approves. °· Only take medicine as told by your doctor. °· Do not take aspirin. °· Keep all doctor visits as told. °· If you or your partner have problems with grieving, talk to your doctor. You can also try counseling. Give yourself time to grieve before trying to get pregnant again. °GET HELP RIGHT AWAY IF: °· You have bad cramps or pain in your back or belly (abdomen). °· You have a fever. °· You pass large clumps of blood (clots) from your vagina that are walnut-sized or larger. Save the clumps for your doctor to see. °· You pass large amounts of tissue from your vagina. Save the tissue for your doctor to see. °· You have more bleeding. °· You have thick, bad-smelling fluid (discharge) coming from the vagina. °· You get lightheaded, weak, or you pass out (faint). °· You have chills. °MAKE SURE YOU: °· Understand these instructions. °· Will watch your condition. °· Will get help right away if you are not doing well or get worse. °  °This information is not intended to replace advice given to you by your health care provider. Make sure you discuss any questions you have with your health care provider. °  °Document Released: 12/07/2011 Document Reviewed: 12/07/2011 °Elsevier Interactive Patient Education ©2016 [employer] ° °

## 2016-03-29 NOTE — MAU Note (Signed)
Pt here for repeat lab work. Pt states that she passed a small clot this morning, is having some moderate bleeding-has changed pad x3 today-last changed 2 hours ago. Denies pain.

## 2016-03-29 NOTE — MAU Provider Note (Signed)
Ms. Brittany Fritz  is a 30 y.o. G2P0011 at 4258w3d who presents to MAU today for follow-up quant hCG after 48 hours. The patient reports that she passed a small clot earlier today and has had slightly heavier bleeding. She states that she used 3 pads today. Bleeding is slightly less than this morning. She states back pain this morning, but none now. She did not take anything for pain. She denies fever.   BP 134/78 mmHg  Pulse 109  Temp(Src) 98 F (36.7 C) (Oral)  Resp 18  Wt 194 lb (87.998 kg)  SpO2 100%  LMP 02/20/2016  CONSTITUTIONAL: Well-developed, well-nourished female in no acute distress.  ENT: External right and left ear normal.  EYES: EOM intact, conjunctivae normal.  MUSCULOSKELETAL: Normal range of motion.  CARDIOVASCULAR: Regular heart rate RESPIRATORY: Normal effort NEUROLOGICAL: Alert and oriented to person, place, and time.  SKIN: Skin is warm and dry. No rash noted. Not diaphoretic. No erythema. No pallor. PSYCH: Normal mood and affect. Normal behavior. Normal judgment and thought content.  Results for Brittany Fritz, Brittany Fritz (MRN [id number]) as of 03/29/2016 20:45  Ref. Range 03/27/2016 19:02 03/27/2016 19:53 03/29/2016 19:06  HCG, Beta Chain, Quant, S Latest Ref Range: <5 mIU/mL 27 (H)  16 (H)   A: SAB  P: Discharge home [facility]-2 weeks Bleeding/ectopic precautions discussed Patient may return to MAU as needed or if her condition were to change or worsen   Marny LowensteinJulie N Jayleigh Notarianni, PA-C 03/29/2016 8:46 PM

## 2016-04-03 ENCOUNTER — Encounter: Payer: Medicaid Other | Admitting: Certified Nurse Midwife

## 2016-04-06 ENCOUNTER — Telehealth: Payer: Self-pay | Admitting: *Deleted

## 2016-04-06 NOTE — Telephone Encounter (Signed)
No message left- in viewing last encounter- patient was seen at MAU- had SAB. She was told to follow up in 1-2 weeks. Left message on VM that we would call to schedule appointment.

## 2016-04-16 ENCOUNTER — Ambulatory Visit (HOSPITAL_COMMUNITY)
Admission: EM | Admit: 2016-04-16 | Discharge: 2016-04-16 | Disposition: A | Discharge status: Home / Self Care | Payer: Medicaid Other | Attending: Emergency Medicine | Admitting: Emergency Medicine

## 2016-04-16 ENCOUNTER — Encounter (HOSPITAL_COMMUNITY): Payer: Self-pay | Admitting: *Deleted

## 2016-04-16 DIAGNOSIS — M542 Cervicalgia: Secondary | ICD-10-CM | POA: Diagnosis not present

## 2016-04-16 MED ORDER — NAPROXEN 500 MG PO TABS: ORAL_TABLET | ORAL | Status: DC

## 2016-04-16 NOTE — Discharge Instructions (Signed)
I suspect you have muscle soreness or spasm. Because your exam is normal, it is not completely known. I see no emergent needs at this time. Take the medication every 12 hours for pain and swelling. If this worsens then suggest a f/u with Dr. Fontaine NoBrookes. (Orthopedics).    Musculoskeletal Pain Musculoskeletal pain is muscle and boney aches and pains. These pains can occur in any part of the body. Your caregiver may treat you without knowing the cause of the pain. They may treat you if blood or urine tests, X-rays, and other tests were normal.  CAUSES There is often not a definite cause or reason for these pains. These pains may be caused by a type of germ (virus). The discomfort may also come from overuse. Overuse includes working out too hard when your body is not fit. Boney aches also come from weather changes. Bone is sensitive to atmospheric pressure changes. HOME CARE INSTRUCTIONS   Ask when your test results will be ready. Make sure you get your test results.  Only take over-the-counter or prescription medicines for pain, discomfort, or fever as directed by your caregiver. If you were given medications for your condition, do not drive, operate machinery or power tools, or sign legal documents for 24 hours. Do not drink alcohol. Do not take sleeping pills or other medications that may interfere with treatment.  Continue all activities unless the activities cause more pain. When the pain lessens, slowly resume normal activities. Gradually increase the intensity and duration of the activities or exercise.  During periods of severe pain, bed rest may be helpful. Lay or sit in any position that is comfortable.  Putting ice on the injured area.  Put ice in a bag.  Place a towel between your skin and the bag.  Leave the ice on for 15 to 20 minutes, 3 to 4 times a day.  Follow up with your caregiver for continued problems and no reason can be found for the pain. If the pain becomes worse or does  not go away, it may be necessary to repeat tests or do additional testing. Your caregiver may need to look further for a possible cause. SEEK IMMEDIATE MEDICAL CARE IF:  You have pain that is getting worse and is not relieved by medications.  You develop chest pain that is associated with shortness or breath, sweating, feeling sick to your stomach (nauseous), or throw up (vomit).  Your pain becomes localized to the abdomen.  You develop any new symptoms that seem different or that concern you. MAKE SURE YOU:   Understand these instructions.  Will watch your condition.  Will get help right away if you are not doing well or get worse.   This information is not intended to replace advice given to you by your health care provider. Make sure you discuss any questions you have with your health care provider.   Document Released: 09/14/2005 Document Revised: 12/07/2011 Document Reviewed: 05/19/2013 Elsevier Interactive Patient Education [employer]2016 Elsevier Inc.

## 2016-04-16 NOTE — ED Notes (Addendum)
Pt   Has    Pain  And   Swelling to  Back  Of   Neck        X  2  Weeks       denys  Any  Injury   Appears in no  acute  Distress       Sitting  Upright  On the   Exam  Table  Speaking  In complete   sentances             Husband  At  Bedside    Pt  Had   Threatned  Ab  sev  Weeks  ago

## 2016-04-16 NOTE — ED Provider Notes (Signed)
CSN: [id number]     Arrival date & time 04/16/16  1852 History   First MD Initiated Contact with Patient 04/16/16 1955     Chief Complaint  Patient presents with  . Neck Pain   (Consider location/radiation/quality/duration/timing/severity/associated sxs/prior Treatment) HPI Comments: 30 yo female who recently experienced a spontaneous abortion presents with neck swelling. She reports no injury. She has mild soreness to the area at times for the last 36 hours. She has no fever, chills, neck pain or recent illness.   Patient is a 30 y.o. female presenting with neck pain. The history is provided by the patient. The history is limited by a language barrier. A language interpreter was used.  Neck Pain Associated symptoms: no fever     Past Medical History  Diagnosis Date  . Medical history non-contributory   . Allergy     Seasonal   Past Surgical History  Procedure Laterality Date  . No past surgeries     Family History  Problem Relation Age of Onset  . Heart disease Father   . Stroke Father    Social History  Substance Use Topics  . Smoking status: Never Smoker   . Smokeless tobacco: None  . Alcohol Use: No   OB History    Gravida Para Term Preterm AB TAB SAB Ectopic Multiple Living   2 0   1  1   1      Review of Systems  Constitutional: Negative for fever, chills and fatigue.  HENT: Negative for congestion and sore throat.   Respiratory: Negative for cough and wheezing.   Musculoskeletal: Negative for neck pain and neck stiffness.    Allergies  Penicillins  Home Medications   Prior to Admission medications   Medication Sig Start Date End Date Taking? Authorizing Provider  naproxen (NAPROSYN) 500 MG tablet 1 tablet every 12 hours with food x 3-4 days then as needed for neck discomfort 04/16/16   Riki SheerMichelle G Kenadie Royce, PA-C  Prenat-FePoly-Metf-FA-DHA-DSS (VITAFOL FE+) 90-1-200 & 50 MG CPPK Take 2 tablets by mouth daily. 01/23/16   Roe Coombsachelle A Denney, CNM   Meds Ordered  and Administered this Visit  Medications - No data to display  BP 110/77 mmHg  Pulse 100  Temp(Src) 97.7 F (36.5 C) (Oral)  Resp 12  SpO2 97%  LMP 02/20/2016  Breastfeeding? Unknown No data found.   Physical Exam  Constitutional: She is oriented to person, place, and time. She appears well-developed and well-nourished. No distress.  HENT:  Head: Normocephalic and atraumatic.  Neck: Normal range of motion. Neck supple.  Examination of posterior base of cervical spine is normal, there is no visible swelling, no erythema or warmth. No pain to palpation. Full ROM of the cervical spine without pain  Musculoskeletal: Normal range of motion. She exhibits no edema or tenderness.  Lymphadenopathy:    She has no cervical adenopathy.  Neurological: She is alert and oriented to person, place, and time.  Skin: Skin is warm and dry. No rash noted. She is not diaphoretic.  Psychiatric: Her behavior is normal.  Nursing note and vitals reviewed.   ED Course  Procedures (including critical care time)  Labs Review Labs Reviewed - No data to display  Imaging Review No results found.   Visual Acuity Review  Right Eye Distance:   Left Eye Distance:   Bilateral Distance:    Right Eye Near:   Left Eye Near:    Bilateral Near:         MDM  1. Musculoskeletal neck pain    Exam is normal, no pain is reproduced by exam. No emergent need. Will try an NSAID and ice and if worsens then f/u with Ortho. All of this guided through interpreter.    Riki Sheer, PA-C 04/16/16 2041

## 2016-04-16 NOTE — ED Notes (Signed)
[employer]pret[employer]  Utilized   

## 2016-04-28 ENCOUNTER — Ambulatory Visit (INDEPENDENT_AMBULATORY_CARE_PROVIDER_SITE_OTHER): Payer: Self-pay | Admitting: Obstetrics and Gynecology

## 2016-04-28 VITALS — BP 108/73 | HR 73 | Wt 195.9 lb

## 2016-04-28 DIAGNOSIS — O039 Complete or unspecified spontaneous abortion without complication: Secondary | ICD-10-CM

## 2016-04-28 NOTE — Progress Notes (Signed)
S:  Brittany Fritz is a 30 y.o. here for a follow up SAB. She was seen in [facility] on 03/29/16 and was told that she had an SAB.  She Normally has periods every 28 days, this period started after 30 days. This seems to be a normal period for her. She is not currently having any pain. Her last Beta Hcg level on 7/2 was 16.  O:  GENERAL: Well-developed, well-nourished female in no acute distress.  LUNGS: Effort normal SKIN: Warm, dry and without erythema PSYCH: Normal mood and affect   A:  1. SAB (spontaneous abortion)     P:  Hcg level drawn Prolactin level pending Return to [facility] as needed, if symptoms worsen     Duane Lope, NP 04/28/2016 3:31 PM

## 2016-04-29 LAB — PROLACTIN: PROLACTIN: 8.3 ng/mL

## 2016-05-08 ENCOUNTER — Ambulatory Visit: Payer: Medicaid Other | Admitting: Certified Nurse Midwife

## 2016-05-26 ENCOUNTER — Ambulatory Visit (INDEPENDENT_AMBULATORY_CARE_PROVIDER_SITE_OTHER): Payer: Self-pay | Admitting: Obstetrics and Gynecology

## 2016-05-26 VITALS — BP 113/81 | HR 98 | Temp 98.2°F | Ht 61.42 in | Wt 193.4 lb

## 2016-05-26 DIAGNOSIS — Z3202 Encounter for pregnancy test, result negative: Secondary | ICD-10-CM

## 2016-05-26 DIAGNOSIS — N96 Recurrent pregnancy loss: Secondary | ICD-10-CM

## 2016-05-26 DIAGNOSIS — Z319 Encounter for procreative management, unspecified: Secondary | ICD-10-CM

## 2016-05-26 DIAGNOSIS — Z3200 Encounter for pregnancy test, result unknown: Secondary | ICD-10-CM

## 2016-05-26 LAB — POCT URINE PREGNANCY: Preg Test, Ur: NEGATIVE

## 2016-05-26 MED ORDER — CLOMIPHENE CITRATE 50 MG PO TABS: 50.0000 mg | ORAL_TABLET | Freq: Every day | ORAL | 3 refills | Status: AC

## 2016-05-26 MED ORDER — FOLIC ACID 800 MCG PO TABS: 400.0000 ug | ORAL_TABLET | Freq: Every day | ORAL | 6 refills | Status: DC

## 2016-05-26 MED ORDER — PRENATAL VITAMINS 0.8 MG PO TABS: 1.0000 | ORAL_TABLET | Freq: Every day | ORAL | 12 refills | Status: DC

## 2016-05-27 LAB — HEMOGLOBIN A1C
ESTIMATED AVERAGE GLUCOSE: 105 mg/dL
HEMOGLOBIN A1C: 5.3 % (ref 4.8–5.6)

## 2016-05-27 NOTE — Progress Notes (Signed)
30 yo G2P1011 with LMP 04/27/2016 presenting today to discuss results and enquire on infertility management. Patient strongly desires to conceive. She was recently diagnosed with a miscarriage. She reports regular cycles of 28-30 days with her menses lasting 5-6 days. She denies any dysmenorrhea. She denies any abdominal pain or abnormal discharge. She thinks she may be pregnant and desires medication to prevent another pregnancy loss.  Past Medical History:  Diagnosis Date  . Allergy    Seasonal  . Medical history non-contributory    Past Surgical History:  Procedure Laterality Date  . NO PAST SURGERIES     Family History  Problem Relation Age of Onset  . Heart disease Father   . Stroke Father    Social History  Substance Use Topics  . Smoking status: Never Smoker  . Smokeless tobacco: Not on file  . Alcohol use No   ROS See pertinent in HPI  Blood pressure 113/81, pulse 98, temperature 98.2 F (36.8 C), height 5' 1.42" (1.56 m), weight 193 lb 6.4 oz (87.7 kg), last menstrual period 04/27/2016, unknown if currently breastfeeding. GENERAL: Well-developed, well-nourished female in no acute distress.  ABDOMEN: Soft, nontender, nondistended. No organomegaly. EXTREMITIES: No cyanosis, clubbing, or edema, 2+ distal pulses.  A/P 30 yo here for preconception counseling - Reassurance provided regarding recurrence rate of miscarriage - Advised patient to start taking prenatal vitamins now. Rx folic acid also provided per patient request but it was explained that supplementation was not necessary - Hg A1c today - Timing of intercourse around ovulation was reviewed with the patient and her husband - Rx Clomid provided  - RTC in 6 months or prn

## 2016-08-24 ENCOUNTER — Ambulatory Visit (INDEPENDENT_AMBULATORY_CARE_PROVIDER_SITE_OTHER): Payer: Medicaid Other | Admitting: Certified Nurse Midwife

## 2016-08-24 ENCOUNTER — Encounter: Payer: Self-pay | Admitting: Certified Nurse Midwife

## 2016-08-24 VITALS — BP 99/65 | HR 99 | Wt 202.0 lb

## 2016-08-24 DIAGNOSIS — O219 Vomiting of pregnancy, unspecified: Secondary | ICD-10-CM | POA: Diagnosis not present

## 2016-08-24 DIAGNOSIS — Z603 Acculturation difficulty: Secondary | ICD-10-CM | POA: Diagnosis not present

## 2016-08-24 DIAGNOSIS — O0991 Supervision of high risk pregnancy, unspecified, first trimester: Secondary | ICD-10-CM | POA: Diagnosis not present

## 2016-08-24 DIAGNOSIS — O0990 Supervision of high risk pregnancy, unspecified, unspecified trimester: Secondary | ICD-10-CM | POA: Insufficient documentation

## 2016-08-24 LAB — POCT URINALYSIS DIPSTICK
BILIRUBIN UA: NEGATIVE
GLUCOSE UA: NEGATIVE
Nitrite, UA: NEGATIVE
Protein, UA: NEGATIVE
SPEC GRAV UA: 1.02
UROBILINOGEN UA: NEGATIVE
pH, UA: 5

## 2016-08-24 MED ORDER — DOXYLAMINE-PYRIDOXINE 10-10 MG PO TBEC: DELAYED_RELEASE_TABLET | ORAL | 4 refills | Status: DC

## 2016-08-24 MED ORDER — NATACHEW 28-1 MG PO CHEW: 1.0000 | CHEWABLE_TABLET | Freq: Every day | ORAL | 12 refills | Status: DC

## 2016-08-24 NOTE — Progress Notes (Signed)
Subjective:    Brittany Fritz is being seen today for her first obstetrical visit.  This is a planned pregnancy. She is at [redacted]w[redacted]d gestation. Her obstetrical history is significant for obesity and miscarriage June 2017.  Infertility treatment with Clomid. Relationship with FOB: spouse, living together. Patient does intend to breast feed. Pregnancy history fully reviewed.  Recent miscarriage in May/July 2017. [place] [employer].  Here for exam with interpreter.  Last delivery was in [place].    The information documented in the HPI was reviewed and verified.  Menstrual History: OB History    Gravida Para Term Preterm AB Living   3 1 1   1 1    SAB TAB Ectopic Multiple Live Births   1       1       Patient's last menstrual period was 05/29/2016.    Past Medical History:  Diagnosis Date  . Allergy    Seasonal  . Medical history non-contributory     Past Surgical History:  Procedure Laterality Date  . NO PAST SURGERIES       (Not in a hospital admission) Allergies  Allergen Reactions  . Penicillins Itching and Rash    Has patient had a PCN reaction causing immediate rash, facial/tongue/throat swelling, SOB or lightheadedness with hypotension: Yes Has patient had a PCN reaction causing severe rash involving mucus membranes or skin necrosis: No Has patient had a PCN reaction that required hospitalization No Has patient had a PCN reaction occurring within the last 10 years: Yes If all of the above answers are "NO", then may proceed with Cephalosporin use.     Social History  Substance Use Topics  . Smoking status: Never Smoker  . Smokeless tobacco: Not on file  . Alcohol use No    Family History  Problem Relation Age of Onset  . Heart disease Father   . Stroke Father      Review of Systems Constitutional: negative for weight loss Gastrointestinal: negative for vomiting Genitourinary:negative for genital lesions and vaginal discharge and dysuria Musculoskeletal:negative for  back pain Behavioral/Psych: negative for abusive relationship, depression, illegal drug usage and tobacco use    Objective:    BP 99/65   Pulse 99   Wt 202 lb (91.6 kg)   LMP 05/29/2016   BMI 37.65 kg/m  General Appearance:    Alert, cooperative, no distress, appears stated age  Head:    Normocephalic, without obvious abnormality, atraumatic  Eyes:    PERRL, conjunctiva/corneas clear, EOM's intact, fundi    benign, both eyes  Ears:    Normal TM's and external ear canals, both ears  Nose:   Nares normal, septum midline, mucosa normal, no drainage    or sinus tenderness  Throat:   Lips, mucosa, and tongue normal; teeth and gums normal  Neck:   Supple, symmetrical, trachea midline, no adenopathy;    thyroid:  no enlargement/tenderness/nodules; no carotid   bruit or JVD  Back:     Symmetric, no curvature, ROM normal, no CVA tenderness  Lungs:     Clear to auscultation bilaterally, respirations unlabored  Chest Wall:    No tenderness or deformity   Heart:    Regular rate and rhythm, S1 and S2 normal, no murmur, rub   or gallop  Breast Exam:    No tenderness, masses, or nipple abnormality  Abdomen:     Soft, non-tender, bowel sounds active all four quadrants,    no masses, no organomegaly  Genitalia:    Normal  female without lesion, discharge or tenderness  Extremities:   Extremities normal, atraumatic, no cyanosis or edema  Pulses:   2+ and symmetric all extremities  Skin:   Skin color, texture, turgor normal, no rashes or lesions  Lymph nodes:   Cervical, supraclavicular, and axillary nodes normal  Neurologic:   CNII-XII intact, normal strength, sensation and reflexes    throughout      Cervix:  Long, thick, closed and posterior.  FHR: 156 by doppler. FH; less than U.     Lab Review Urine pregnancy test Labs reviewed yes Radiologic studies reviewed no Assessment:    Pregnancy at 6061w3d weeks   H/O Miscarriage in the last 12 months  maternal obesity  N&V in early pregnancy    Plan:      Prenatal vitamins.  Counseling provided regarding continued use of seat belts, cessation of alcohol consumption, smoking or use of illicit drugs; infection precautions i.e., influenza/TDAP immunizations, toxoplasmosis,CMV, parvovirus, listeria and varicella; workplace safety, exercise during pregnancy; routine dental care, safe medications, sexual activity, hot tubs, saunas, pools, travel, caffeine use, fish and methlymercury, potential toxins, hair treatments, varicose veins Weight gain recommendations per IOM guidelines reviewed: underweight/BMI< 18.5--> gain 28 - 40 lbs; normal weight/BMI 18.5 - 24.9--> gain 25 - 35 lbs; overweight/BMI 25 - 29.9--> gain 15 - 25 lbs; obese/BMI >30->gain  11 - 20 lbs Problem list reviewed and updated. FIRST/CF mutation testing/NIPT/QUAD SCREEN/fragile X/Ashkenazi Jewish population testing/Spinal muscular atrophy discussed: ordered. Role of ultrasound in pregnancy discussed; fetal survey: requested. Amniocentesis discussed: not indicated. VBAC calculator score: VBAC consent form provided No orders of the defined types were placed in this encounter.  Orders Placed This Encounter  Procedures  . Culture, OB Urine  . Hemoglobinopathy evaluation  . Varicella zoster antibody, IgG  . VITAMIN D 25 Hydroxy (Vit-D Deficiency, Fractures)  . Obstetric Panel, Including HIV  . Hemoglobin A1c  . Cystic Fibrosis Mutation 97  . ToxASSURE Select 13 (MW), Urine  . MaterniT21 PLUS Core+SCA    Order Specific Question:   Is the patient insulin dependent?    Answer:   No    Order Specific Question:   Please enter gestational age. This should be expressed as weeks AND days, i.e. 16w 6d. Enter weeks here. Enter days in next question.    Answer:   6712    Order Specific Question:   Please enter gestational age. This should be expressed as weeks AND days, i.e. 16w 6d. Enter days here. Enter weeks in previous question.    Answer:   3    Order Specific Question:   How  was gestational age calculated?    Answer:   LMP    Order Specific Question:   Please give the date of LMP OR Ultrasound OR Estimated date of delivery.    Answer:   03/05/2017    Order Specific Question:   Number of Fetuses (Type of Pregnancy):    Answer:   1    Order Specific Question:   Indications for performing the test? (please choose all that apply):    Answer:   Routine screening    Order Specific Question:   Other Indications? (Y=Yes, N=No)    Answer:   N    Order Specific Question:   If this is a repeat specimen, please indicate the reason:    Answer:   Not indicated    Order Specific Question:   Please specify the patient's race: (C=White/Caucasion, B=Black, I=Native American, A=Asian, H=Hispanic, O=Other, U=Unknown)  Answer:   O    Order Specific Question:   Donor Egg - indicate if the egg was obtained from in vitro fertilization.    Answer:   N    Order Specific Question:   Age of Egg Donor.    Answer:   [age]    Order Specific Question:   Prior Down Syndrome/ONTD screening during current pregnancy.    Answer:   N    Order Specific Question:   Prior First Trimester Testing    Answer:   N    Order Specific Question:   Prior Second Trimester Testing    Answer:   N    Order Specific Question:   Family History of Neural Tube Defects    Answer:   N    Order Specific Question:   Prior Pregnancy with Down Syndrome    Answer:   N    Order Specific Question:   Please give the patient's weight (in pounds)    Answer:   202  . Beta HCG, Quant    Follow up in 4 weeks. 50% of 30 min visit spent on counseling and coordination of care.

## 2016-08-24 NOTE — Addendum Note (Signed)
Addended by: Marya LandryFOSTER, Alexsis Kathman D on: 08/24/2016 04:13 PM   Modules accepted: Orders

## 2016-08-25 ENCOUNTER — Telehealth: Payer: Self-pay | Admitting: *Deleted

## 2016-08-25 NOTE — Telephone Encounter (Signed)
PA for Diclegis Rx was submitted and approved. Pharmacy made aware.

## 2016-08-26 LAB — URINE CULTURE, OB REFLEX

## 2016-08-26 LAB — CULTURE, OB URINE

## 2016-08-27 ENCOUNTER — Other Ambulatory Visit: Payer: Self-pay | Admitting: Certified Nurse Midwife

## 2016-08-27 DIAGNOSIS — B3741 Candidal cystitis and urethritis: Secondary | ICD-10-CM

## 2016-08-27 MED ORDER — TERCONAZOLE 0.8 % VA CREA: 1.0000 | TOPICAL_CREAM | Freq: Every day | VAGINAL | 0 refills | Status: DC

## 2016-08-29 LAB — HEMOGLOBINOPATHY EVALUATION
HEMOGLOBIN F QUANTITATION: 0 % (ref 0.0–2.0)
HGB A: 97.3 % (ref 94.0–98.0)
HGB C: 0 %
HGB S: 0 %
Hemoglobin A2 Quantitation: 2.7 % (ref 0.7–3.1)

## 2016-08-29 LAB — CYSTIC FIBROSIS MUTATION 97: GENE DIS ANAL CARRIER INTERP BLD/T-IMP: NOT DETECTED

## 2016-08-29 LAB — OBSTETRIC PANEL, INCLUDING HIV
Antibody Screen: NEGATIVE
BASOS ABS: 0 10*3/uL (ref 0.0–0.2)
Basos: 0 %
EOS (ABSOLUTE): 0.3 10*3/uL (ref 0.0–0.4)
EOS: 3 %
HEMOGLOBIN: 12.6 g/dL (ref 11.1–15.9)
HEP B S AG: NEGATIVE
HIV SCREEN 4TH GENERATION: NONREACTIVE
Hematocrit: 37.9 % (ref 34.0–46.6)
IMMATURE GRANULOCYTES: 0 %
Immature Grans (Abs): 0 10*3/uL (ref 0.0–0.1)
LYMPHS ABS: 1.5 10*3/uL (ref 0.7–3.1)
Lymphs: 15 %
MCH: 26.1 pg — AB (ref 26.6–33.0)
MCHC: 33.2 g/dL (ref 31.5–35.7)
MCV: 79 fL (ref 79–97)
MONOCYTES: 5 %
Monocytes Absolute: 0.5 10*3/uL (ref 0.1–0.9)
NEUTROS ABS: 7.4 10*3/uL — AB (ref 1.4–7.0)
Neutrophils: 77 %
PLATELETS: 204 10*3/uL (ref 150–379)
RBC: 4.83 x10E6/uL (ref 3.77–5.28)
RDW: 15.3 % (ref 12.3–15.4)
RH TYPE: POSITIVE
RPR: NONREACTIVE
RUBELLA: 16 {index} (ref >0.99)
WBC: 9.7 10*3/uL (ref 3.4–10.8)

## 2016-08-29 LAB — BETA HCG QUANT (REF LAB): HCG QUANT: 47368 m[IU]/mL

## 2016-08-29 LAB — HEMOGLOBIN A1C
ESTIMATED AVERAGE GLUCOSE: 100 mg/dL
Hgb A1c MFr Bld: 5.1 % (ref 4.8–5.6)

## 2016-08-29 LAB — VITAMIN D 25 HYDROXY (VIT D DEFICIENCY, FRACTURES): VIT D 25 HYDROXY: 12.1 ng/mL — AB (ref 30.0–100.0)

## 2016-08-29 LAB — VARICELLA ZOSTER ANTIBODY, IGG: Varicella zoster IgG: 614 index (ref >165)

## 2016-08-31 ENCOUNTER — Telehealth: Payer: Self-pay

## 2016-08-31 ENCOUNTER — Encounter: Payer: Self-pay | Admitting: Certified Nurse Midwife

## 2016-08-31 ENCOUNTER — Other Ambulatory Visit: Payer: Self-pay | Admitting: Certified Nurse Midwife

## 2016-08-31 DIAGNOSIS — R7989 Other specified abnormal findings of blood chemistry: Secondary | ICD-10-CM

## 2016-08-31 MED ORDER — VITAMIN D (ERGOCALCIFEROL) 1.25 MG (50000 UNIT) PO CAPS: 50000.0000 [IU] | ORAL_CAPSULE | ORAL | 2 refills | Status: DC

## 2016-08-31 NOTE — Telephone Encounter (Signed)
Called pt to advise of results and rx sent to pharmacy, no answer, left vm to call.

## 2016-09-01 ENCOUNTER — Other Ambulatory Visit: Payer: Self-pay | Admitting: Certified Nurse Midwife

## 2016-09-01 DIAGNOSIS — O0992 Supervision of high risk pregnancy, unspecified, second trimester: Secondary | ICD-10-CM

## 2016-09-01 LAB — MATERNIT21 PLUS CORE+SCA
Chromosome 13: NEGATIVE
Chromosome 18: NEGATIVE
Chromosome 21: NEGATIVE
Y CHROMOSOME: DETECTED

## 2016-09-03 ENCOUNTER — Telehealth: Payer: Self-pay

## 2016-09-03 NOTE — Telephone Encounter (Signed)
Returned call ,left message to call back.

## 2016-09-03 NOTE — Telephone Encounter (Signed)
Spoke with patient and informed of lab results and rx sent by provider.

## 2016-09-09 ENCOUNTER — Ambulatory Visit (HOSPITAL_COMMUNITY)
Admission: EM | Admit: 2016-09-09 | Discharge: 2016-09-09 | Disposition: A | Discharge status: Home / Self Care | Payer: Medicaid Other | Attending: Family Medicine | Admitting: Family Medicine

## 2016-09-09 ENCOUNTER — Telehealth: Payer: Self-pay

## 2016-09-09 ENCOUNTER — Encounter (HOSPITAL_COMMUNITY): Payer: Self-pay | Admitting: Emergency Medicine

## 2016-09-09 DIAGNOSIS — T2010XA Burn of first degree of head, face, and neck, unspecified site, initial encounter: Secondary | ICD-10-CM

## 2016-09-09 MED ORDER — BACITRACIN ZINC 500 UNIT/GM EX OINT: 1.0000 "application " | TOPICAL_OINTMENT | Freq: Two times a day (BID) | CUTANEOUS | 0 refills | Status: DC
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
## 2016-09-22 ENCOUNTER — Ambulatory Visit (INDEPENDENT_AMBULATORY_CARE_PROVIDER_SITE_OTHER): Payer: Medicaid Other | Admitting: Certified Nurse Midwife

## 2016-09-22 VITALS — BP 109/74 | HR 101 | Wt 207.0 lb

## 2016-09-22 DIAGNOSIS — O0992 Supervision of high risk pregnancy, unspecified, second trimester: Secondary | ICD-10-CM

## 2016-09-22 DIAGNOSIS — R7989 Other specified abnormal findings of blood chemistry: Secondary | ICD-10-CM

## 2016-09-22 DIAGNOSIS — Z603 Acculturation difficulty: Secondary | ICD-10-CM

## 2016-09-22 DIAGNOSIS — E559 Vitamin D deficiency, unspecified: Secondary | ICD-10-CM

## 2016-09-22 MED ORDER — VITAFOL-NANO 18-0.6-0.4 MG PO TABS: 1.0000 | ORAL_TABLET | Freq: Every day | ORAL | 12 refills | Status: DC

## 2016-09-22 NOTE — Progress Notes (Signed)
  Subjective:    Mikey CollegeSarah Deam is a 30 y.o. female being seen today for her obstetrical visit. She is at 10390w4d gestation. Patient reports: no complaints.  Here for exam with interpreter and spouse.    Problem List Items Addressed This Visit      Other   Immigrant with language difficulty - Primary   Supervision of high-risk pregnancy   Relevant Orders   US MFM OB COMP + 14 WK   AFP, Serum, Open Spina Bifida   Low vitamin D level     Patient Active Problem List   Diagnosis Date Noted  . Low vitamin D level 08/31/2016  . Supervision of high-risk pregnancy 08/24/2016  . Immigrant with language difficulty 01/23/2016    Objective:     BP 109/74   Pulse (!) 101   Wt 207 lb (93.9 kg)   LMP 05/29/2016   BMI 38.58 kg/m  Uterine Size: Below umbilicus   FHR: 150 by doppler  Assessment:    Pregnancy @ 3190w4d  weeks Doing well    Plan:    Problem list reviewed and updated. Labs reviewed.  Follow up in 4 weeks. FIRST/CF mutation testing/NIPT/QUAD SCREEN/fragile X/Ashkenazi Jewish population testing/Spinal muscular atrophy discussed: results reviewed. Role of ultrasound in pregnancy discussed; fetal survey: ordered. Amniocentesis discussed: not indicated. 50% of 25 minute visit spent on counseling and coordination of care.

## 2016-09-24 ENCOUNTER — Encounter (HOSPITAL_COMMUNITY): Payer: Self-pay | Admitting: Certified Nurse Midwife

## 2016-09-24 LAB — AFP, SERUM, OPEN SPINA BIFIDA
AFP MoM: 0.79
AFP Value: 23.2 ng/mL
GEST. AGE ON COLLECTION DATE: 16.6 wk
MATERNAL AGE AT EDD: 29 a
OSBR Risk 1 IN: 10000
TEST RESULTS AFP: NEGATIVE
Weight: 207 [lb_av]

## 2016-09-25 ENCOUNTER — Other Ambulatory Visit: Payer: Self-pay | Admitting: Certified Nurse Midwife

## 2016-09-25 DIAGNOSIS — O0992 Supervision of high risk pregnancy, unspecified, second trimester: Secondary | ICD-10-CM

## 2016-09-28 NOTE — L&D Delivery Note (Signed)
31 y.o. G3P1011 at 2863w3d delivered a viable female infant in cephalic, LOA position. Loose nuchal cord, easily reduced.  Anterior shoulder delivered with ease. 60 sec delayed cord clamping. Cord clamped x2 and cut. Placenta delivered spontaneously intact, with 3VC. Fundus firm on exam with massage and pitocin. Good hemostasis noted.  Anesthesia: No epidural Laceration: 1st degree perineal laceration Good hemostasis noted. EBL: 450 cc  Mom and baby recovering in LDR.    Weight: Pending skin to skin  Sponge and instrument count were correct x2. Placenta sent to L&D  Howard PouchLauren Shelonda Saxe, MD PGY-1 Family Medicine 03/01/2017, 12:31 PM

## 2016-10-09 ENCOUNTER — Other Ambulatory Visit: Payer: Self-pay | Admitting: Certified Nurse Midwife

## 2016-10-09 ENCOUNTER — Ambulatory Visit (HOSPITAL_COMMUNITY)
Admission: RE | Admit: 2016-10-09 | Discharge: 2016-10-09 | Disposition: A | Discharge status: Home / Self Care | Payer: Medicaid Other | Source: Ambulatory Visit | Attending: Certified Nurse Midwife | Admitting: Certified Nurse Midwife

## 2016-10-09 DIAGNOSIS — O0992 Supervision of high risk pregnancy, unspecified, second trimester: Secondary | ICD-10-CM

## 2016-10-09 DIAGNOSIS — O99212 Obesity complicating pregnancy, second trimester: Secondary | ICD-10-CM

## 2016-10-09 DIAGNOSIS — Z363 Encounter for antenatal screening for malformations: Secondary | ICD-10-CM | POA: Insufficient documentation

## 2016-10-09 DIAGNOSIS — Z3A19 19 weeks gestation of pregnancy: Secondary | ICD-10-CM

## 2016-10-09 DIAGNOSIS — Z3689 Encounter for other specified antenatal screening: Secondary | ICD-10-CM

## 2016-10-09 DIAGNOSIS — Z6839 Body mass index (BMI) 39.0-39.9, adult: Secondary | ICD-10-CM | POA: Insufficient documentation

## 2016-10-09 DIAGNOSIS — E669 Obesity, unspecified: Secondary | ICD-10-CM | POA: Diagnosis not present

## 2016-10-15 ENCOUNTER — Other Ambulatory Visit: Payer: Self-pay | Admitting: Certified Nurse Midwife

## 2016-10-15 DIAGNOSIS — O0992 Supervision of high risk pregnancy, unspecified, second trimester: Secondary | ICD-10-CM

## 2016-10-20 ENCOUNTER — Ambulatory Visit (INDEPENDENT_AMBULATORY_CARE_PROVIDER_SITE_OTHER): Payer: Medicaid Other | Admitting: Certified Nurse Midwife

## 2016-10-20 VITALS — BP 111/73 | HR 111 | Temp 97.6°F | Wt 208.6 lb

## 2016-10-20 DIAGNOSIS — R7989 Other specified abnormal findings of blood chemistry: Secondary | ICD-10-CM

## 2016-10-20 DIAGNOSIS — Z603 Acculturation difficulty: Secondary | ICD-10-CM

## 2016-10-20 DIAGNOSIS — O0992 Supervision of high risk pregnancy, unspecified, second trimester: Secondary | ICD-10-CM

## 2016-10-20 NOTE — Progress Notes (Signed)
   PRENATAL VISIT NOTE  Subjective:  Brittany Fritz is a 31 y.o. G3P1011 at 2287w4d being seen today for ongoing prenatal care.  She is currently monitored for the following issues for this high-risk pregnancy and has Immigrant with language difficulty; Supervision of high-risk pregnancy; and Low vitamin D level on her problem list.  Patient reports backache, no bleeding, no contractions and leg cramps.  Contractions: Not present. Vag. Bleeding: None.  Movement: Present. Denies leaking of fluid.   The following portions of the patient's history were reviewed and updated as appropriate: allergies, current medications, past family history, past medical history, past social history, past surgical history and problem list. Problem list updated.  Objective:   Vitals:   10/20/16 1556  BP: 111/73  Pulse: (!) 111  Temp: 97.6 F (36.4 C)  Weight: 208 lb 9.6 oz (94.6 kg)    Fetal Status: Fetal Heart Rate (bpm): 155 Fundal Height: 20 cm Movement: Present     General:  Alert, oriented and cooperative. Patient is in no acute distress.  Skin: Skin is warm and dry. No rash noted.   Cardiovascular: Normal heart rate noted  Respiratory: Normal respiratory effort, no problems with respiration noted  Abdomen: Soft, gravid, appropriate for gestational age. Pain/Pressure: Absent     Pelvic:  Cervical exam deferred        Extremities: Normal range of motion.  Edema: None  Mental Status: Normal mood and affect. Normal behavior. Normal judgment and thought content.   Assessment and Plan:  Pregnancy: G3P1011 at 6787w4d  1. Supervision of high risk pregnancy in second trimester      Doing well, normal discomforts of pregnancy  2. Low vitamin D level     Taking vitamin D weekly  3. Immigrant with language difficulty     Here for exam with interpreter.    Preterm labor symptoms and general obstetric precautions including but not limited to vaginal bleeding, contractions, leaking of fluid and fetal movement  were reviewed in detail with the patient. Please refer to After Visit Summary for other counseling recommendations.  Return in about 4 weeks (around 11/17/2016).   Roe Coombsachelle A Caron Tardif, CNM

## 2016-11-17 ENCOUNTER — Ambulatory Visit (INDEPENDENT_AMBULATORY_CARE_PROVIDER_SITE_OTHER): Payer: Medicaid Other | Admitting: Certified Nurse Midwife

## 2016-11-17 VITALS — BP 103/72 | HR 105

## 2016-11-17 DIAGNOSIS — R29898 Other symptoms and signs involving the musculoskeletal system: Secondary | ICD-10-CM

## 2016-11-17 DIAGNOSIS — Z3482 Encounter for supervision of other normal pregnancy, second trimester: Secondary | ICD-10-CM

## 2016-11-17 DIAGNOSIS — R7989 Other specified abnormal findings of blood chemistry: Secondary | ICD-10-CM

## 2016-11-17 DIAGNOSIS — Z3480 Encounter for supervision of other normal pregnancy, unspecified trimester: Secondary | ICD-10-CM

## 2016-11-17 DIAGNOSIS — M549 Dorsalgia, unspecified: Secondary | ICD-10-CM

## 2016-11-17 DIAGNOSIS — E559 Vitamin D deficiency, unspecified: Secondary | ICD-10-CM

## 2016-11-17 DIAGNOSIS — Z603 Acculturation difficulty: Secondary | ICD-10-CM

## 2016-11-17 NOTE — Progress Notes (Signed)
   PRENATAL VISIT NOTE  Subjective:  Brittany Fritz is a 31 y.o. G3P1011 at 7082w4d being seen today for ongoing prenatal care.  She is currently monitored for the following issues for this low-risk pregnancy and has Immigrant with language difficulty; Supervision of other normal pregnancy, antepartum; and Low vitamin D level on her problem list.  Patient reports backache, no bleeding, no contractions, no cramping and no leaking.  Contractions: Not present. Vag. Bleeding: None.  Movement: Present. Denies leaking of fluid.   The following portions of the patient's history were reviewed and updated as appropriate: allergies, current medications, past family history, past medical history, past social history, past surgical history and problem list. Problem list updated.  Objective:   Vitals:   11/17/16 1621  BP: 103/72  Pulse: (!) 105    Fetal Status: Fetal Heart Rate (bpm): 155 Fundal Height: 24 cm Movement: Present     General:  Alert, oriented and cooperative. Patient is in no acute distress.  Skin: Skin is warm and dry. No rash noted.   Cardiovascular: Normal heart rate noted  Respiratory: Normal respiratory effort, no problems with respiration noted  Abdomen: Soft, gravid, appropriate for gestational age. Pain/Pressure: Absent     Pelvic:  Cervical exam deferred        Extremities: Normal range of motion.     Mental Status: Normal mood and affect. Normal behavior. Normal judgment and thought content.   Assessment and Plan:  Pregnancy: G3P1011 at 2482w4d  1. Supervision of other normal pregnancy, antepartum        2. Low vitamin D level     Taking weekly vitamin D.   3. Immigrant with language difficulty     Here for exam with interpreter.    4. Tenderness over spine      States that she has mass that comes and goes.  No mass palpated at base of cervical spine. - Ambulatory referral to Spine Surgery  Preterm labor symptoms and general obstetric precautions including but not  limited to vaginal bleeding, contractions, leaking of fluid and fetal movement were reviewed in detail with the patient. Please refer to After Visit Summary for other counseling recommendations.  Return in about 4 weeks (around 12/15/2016) for ROB, 2 hr OGTT.   Roe Coombsachelle A Izaiyah Kleinman, CNM

## 2016-11-17 NOTE — Progress Notes (Signed)
Pt states that she has a mass at back of head/neck area.   Pt states that it is painful and tender. Pt has been seen in ED for problem and was advised to have OB refer.

## 2016-12-15 ENCOUNTER — Other Ambulatory Visit: Payer: Medicaid Other

## 2016-12-15 ENCOUNTER — Encounter: Payer: Self-pay | Admitting: Certified Nurse Midwife

## 2016-12-15 ENCOUNTER — Ambulatory Visit (INDEPENDENT_AMBULATORY_CARE_PROVIDER_SITE_OTHER): Payer: Medicaid Other | Admitting: Certified Nurse Midwife

## 2016-12-15 VITALS — BP 110/70 | HR 109 | Wt 214.0 lb

## 2016-12-15 DIAGNOSIS — O0992 Supervision of high risk pregnancy, unspecified, second trimester: Secondary | ICD-10-CM

## 2016-12-15 DIAGNOSIS — Z3480 Encounter for supervision of other normal pregnancy, unspecified trimester: Secondary | ICD-10-CM

## 2016-12-15 DIAGNOSIS — E559 Vitamin D deficiency, unspecified: Secondary | ICD-10-CM

## 2016-12-15 DIAGNOSIS — Z603 Acculturation difficulty: Secondary | ICD-10-CM

## 2016-12-15 DIAGNOSIS — Z3483 Encounter for supervision of other normal pregnancy, third trimester: Secondary | ICD-10-CM

## 2016-12-15 DIAGNOSIS — R7989 Other specified abnormal findings of blood chemistry: Secondary | ICD-10-CM

## 2016-12-15 MED ORDER — VITAMIN D (ERGOCALCIFEROL) 1.25 MG (50000 UNIT) PO CAPS: 50000.0000 [IU] | ORAL_CAPSULE | ORAL | 2 refills | Status: DC

## 2016-12-15 MED ORDER — VITAFOL-NANO 18-0.6-0.4 MG PO TABS: 1.0000 | ORAL_TABLET | Freq: Every day | ORAL | 12 refills | Status: DC

## 2016-12-15 NOTE — Progress Notes (Signed)
Patient denies pain/pressure, reports good fetal movement and swelling in hands.

## 2016-12-15 NOTE — Progress Notes (Signed)
   PRENATAL VISIT NOTE  Subjective:  Brittany Fritz is a 31 y.o. G3P1011 at 4338w4d being seen today for ongoing prenatal care.  She is currently monitored for the following issues for this low-risk pregnancy and has Immigrant with language difficulty; Supervision of other normal pregnancy, antepartum; and Low vitamin D level on her problem list.  Patient reports no bleeding, no contractions, no cramping, no leaking and edema in hands.  Contractions: Not present. Vag. Bleeding: None.  Movement: Present. Denies leaking of fluid.   The following portions of the patient's history were reviewed and updated as appropriate: allergies, current medications, past family history, past medical history, past social history, past surgical history and problem list. Problem list updated.  Objective:   Vitals:   12/15/16 0909  BP: 110/70  Pulse: (!) 109  Weight: 214 lb (97.1 kg)    Fetal Status: Fetal Heart Rate (bpm): 147 Fundal Height: 28 cm Movement: Present     General:  Alert, oriented and cooperative. Patient is in no acute distress.  Skin: Skin is warm and dry. No rash noted.   Cardiovascular: Normal heart rate noted  Respiratory: Normal respiratory effort, no problems with respiration noted  Abdomen: Soft, gravid, appropriate for gestational age. Pain/Pressure: Absent     Pelvic:  Cervical exam deferred        Extremities: Normal range of motion.  Edema: Trace  Mental Status: Normal mood and affect. Normal behavior. Normal judgment and thought content.   Assessment and Plan:  Pregnancy: G3P1011 at 2838w4d  1. Supervision of other normal pregnancy, antepartum      Doing well.  Nomotensive.  - Glucose Tolerance, 2 Hours w/1 Hour - HIV antibody - RPR - CBC - Prenatal-Fe Fum-Methf-FA w/o A (VITAFOL-NANO) 18-0.6-0.4 MG TABS; Take 1 tablet by mouth daily.  Dispense: 30 tablet; Refill: 12  2. Low vitamin D level     - Vitamin D, Ergocalciferol, (DRISDOL) 50000 units CAPS capsule; Take 1 capsule  (50,000 Units total) by mouth every 7 (seven) days.  Dispense: 30 capsule; Refill: 2  3. Immigrant with language difficulty     Video translator used         Preterm labor symptoms and general obstetric precautions including but not limited to vaginal bleeding, contractions, leaking of fluid and fetal movement were reviewed in detail with the patient. Please refer to After Visit Summary for other counseling recommendations.  Return in about 2 weeks (around 12/29/2016) for ROB.   Roe Coombsachelle A Denney, CNM

## 2016-12-16 LAB — CBC
HEMATOCRIT: 31.3 % — AB (ref 34.0–46.6)
Hemoglobin: 10 g/dL — ABNORMAL LOW (ref 11.1–15.9)
MCH: 24.6 pg — ABNORMAL LOW (ref 26.6–33.0)
MCHC: 31.9 g/dL (ref 31.5–35.7)
MCV: 77 fL — AB (ref 79–97)
Platelets: 172 10*3/uL (ref 150–379)
RBC: 4.06 x10E6/uL (ref 3.77–5.28)
RDW: 15.8 % — ABNORMAL HIGH (ref 12.3–15.4)
WBC: 9.6 10*3/uL (ref 3.4–10.8)

## 2016-12-16 LAB — GLUCOSE TOLERANCE, 2 HOURS W/ 1HR
Glucose, 1 hour: 244 mg/dL — ABNORMAL HIGH (ref 65–179)
Glucose, 2 hour: 212 mg/dL — ABNORMAL HIGH (ref 65–152)
Glucose, Fasting: 94 mg/dL — ABNORMAL HIGH (ref 65–91)

## 2016-12-16 LAB — HIV ANTIBODY (ROUTINE TESTING W REFLEX): HIV Screen 4th Generation wRfx: NONREACTIVE

## 2016-12-16 LAB — RPR: RPR Ser Ql: NONREACTIVE

## 2016-12-17 ENCOUNTER — Other Ambulatory Visit: Payer: Self-pay | Admitting: Certified Nurse Midwife

## 2016-12-17 DIAGNOSIS — Z8632 Personal history of gestational diabetes: Secondary | ICD-10-CM | POA: Insufficient documentation

## 2016-12-17 DIAGNOSIS — O24419 Gestational diabetes mellitus in pregnancy, unspecified control: Secondary | ICD-10-CM

## 2016-12-17 DIAGNOSIS — O09299 Supervision of pregnancy with other poor reproductive or obstetric history, unspecified trimester: Secondary | ICD-10-CM | POA: Insufficient documentation

## 2016-12-18 ENCOUNTER — Encounter: Payer: Self-pay | Admitting: *Deleted

## 2016-12-18 ENCOUNTER — Other Ambulatory Visit: Payer: Self-pay | Admitting: *Deleted

## 2016-12-18 DIAGNOSIS — O24410 Gestational diabetes mellitus in pregnancy, diet controlled: Secondary | ICD-10-CM

## 2016-12-18 MED ORDER — ACCU-CHEK GUIDE W/DEVICE KIT: 1.0000 | PACK | 0 refills | Status: DC

## 2016-12-29 ENCOUNTER — Ambulatory Visit (INDEPENDENT_AMBULATORY_CARE_PROVIDER_SITE_OTHER): Payer: Medicaid Other | Admitting: Certified Nurse Midwife

## 2016-12-29 VITALS — BP 107/73 | HR 103 | Wt 224.0 lb

## 2016-12-29 DIAGNOSIS — O99613 Diseases of the digestive system complicating pregnancy, third trimester: Secondary | ICD-10-CM

## 2016-12-29 DIAGNOSIS — Z3480 Encounter for supervision of other normal pregnancy, unspecified trimester: Secondary | ICD-10-CM

## 2016-12-29 DIAGNOSIS — O99013 Anemia complicating pregnancy, third trimester: Secondary | ICD-10-CM

## 2016-12-29 DIAGNOSIS — D649 Anemia, unspecified: Secondary | ICD-10-CM

## 2016-12-29 DIAGNOSIS — K219 Gastro-esophageal reflux disease without esophagitis: Secondary | ICD-10-CM

## 2016-12-29 DIAGNOSIS — R7989 Other specified abnormal findings of blood chemistry: Secondary | ICD-10-CM

## 2016-12-29 DIAGNOSIS — O24410 Gestational diabetes mellitus in pregnancy, diet controlled: Secondary | ICD-10-CM

## 2016-12-29 MED ORDER — OMEPRAZOLE 20 MG PO CPDR: 20.0000 mg | DELAYED_RELEASE_CAPSULE | Freq: Two times a day (BID) | ORAL | 5 refills | Status: DC

## 2016-12-29 MED ORDER — FERROUS GLUCONATE 240 (27 FE) MG PO TABS: 240.0000 mg | ORAL_TABLET | Freq: Two times a day (BID) | ORAL | 4 refills | Status: DC

## 2016-12-29 NOTE — Progress Notes (Signed)
Patient has questions about her labs. 

## 2016-12-30 ENCOUNTER — Encounter: Payer: Self-pay | Admitting: Certified Nurse Midwife

## 2016-12-30 NOTE — Progress Notes (Signed)
   PRENATAL VISIT NOTE  Subjective:  Brittany Fritz is a 31 y.o. G3P1011 at [redacted]w[redacted]d being seen today for ongoing prenatal care.  She is currently monitored for the following issues for this high-risk pregnancy and has Immigrant with language difficulty; Supervision of other normal pregnancy, antepartum; Low vitamin D level; and GDM (gestational diabetes mellitus) on her problem list.  Patient reports no complaints.  Contractions: Not present. Vag. Bleeding: None.  Movement: Present. Denies leaking of fluid.   The following portions of the patient's history were reviewed and updated as appropriate: allergies, current medications, past family history, past medical history, past social history, past surgical history and problem list. Problem list updated.  Objective:   Vitals:   12/29/16 1607  BP: 107/73  Pulse: (!) 103  Weight: 224 lb (101.6 kg)    Fetal Status: Fetal Heart Rate (bpm): 140 Fundal Height: 32 cm Movement: Present     General:  Alert, oriented and cooperative. Patient is in no acute distress.  Skin: Skin is warm and dry. No rash noted.   Cardiovascular: Normal heart rate noted  Respiratory: Normal respiratory effort, no problems with respiration noted  Abdomen: Soft, gravid, appropriate for gestational age. Pain/Pressure: Absent     Pelvic:  Cervical exam deferred        Extremities: Normal range of motion.  Edema: None  Mental Status: Normal mood and affect. Normal behavior. Normal judgment and thought content.   Assessment and Plan:  Pregnancy: G3P1011 at [redacted]w[redacted]d  1. Anemia affecting pregnancy in third trimester      - ferrous gluconate (IRON 27) 240 (27 FE) MG tablet; Take 1 tablet (240 mg total) by mouth 2 (two) times daily.  Dispense: 60 tablet; Refill: 4  2. Diet controlled gestational diabetes mellitus (GDM) in third trimester     Has DM class and f/u US scheduled for 01/07/17.  Has meter and test strips.   3. Supervision of other normal pregnancy, antepartum  Doing well  4. Low vitamin D level     Taking weekly vitamin D  5. Gastroesophageal reflux during pregnancy, antepartum, third trimester     TUMS OTC as well as.  Diet discussed at length.  - omeprazole (PRILOSEC) 20 MG capsule; Take 1 capsule (20 mg total) by mouth 2 (two) times daily before a meal.  Dispense: 60 capsule; Refill: 5  Preterm labor symptoms and general obstetric precautions including but not limited to vaginal bleeding, contractions, leaking of fluid and fetal movement were reviewed in detail with the patient. Please refer to After Visit Summary for other counseling recommendations.  Return in about 2 weeks (around 01/12/2017) for ROB.   Roe Coombs, CNM

## 2017-01-07 ENCOUNTER — Other Ambulatory Visit (HOSPITAL_COMMUNITY): Payer: Self-pay | Admitting: *Deleted

## 2017-01-07 ENCOUNTER — Encounter: Payer: Medicaid Other | Attending: Certified Nurse Midwife | Admitting: *Deleted

## 2017-01-07 ENCOUNTER — Other Ambulatory Visit: Payer: Self-pay | Admitting: Certified Nurse Midwife

## 2017-01-07 ENCOUNTER — Ambulatory Visit (HOSPITAL_COMMUNITY)
Admission: RE | Admit: 2017-01-07 | Discharge: 2017-01-07 | Disposition: A | Discharge status: Home / Self Care | Payer: Medicaid Other | Source: Ambulatory Visit | Attending: Certified Nurse Midwife | Admitting: Certified Nurse Midwife

## 2017-01-07 ENCOUNTER — Encounter (HOSPITAL_COMMUNITY): Payer: Self-pay

## 2017-01-07 DIAGNOSIS — Z3A31 31 weeks gestation of pregnancy: Secondary | ICD-10-CM | POA: Diagnosis not present

## 2017-01-07 DIAGNOSIS — O99212 Obesity complicating pregnancy, second trimester: Secondary | ICD-10-CM | POA: Insufficient documentation

## 2017-01-07 DIAGNOSIS — E669 Obesity, unspecified: Secondary | ICD-10-CM

## 2017-01-07 DIAGNOSIS — O24419 Gestational diabetes mellitus in pregnancy, unspecified control: Secondary | ICD-10-CM

## 2017-01-07 DIAGNOSIS — R7309 Other abnormal glucose: Secondary | ICD-10-CM

## 2017-01-07 DIAGNOSIS — Z6839 Body mass index (BMI) 39.0-39.9, adult: Secondary | ICD-10-CM | POA: Diagnosis not present

## 2017-01-07 DIAGNOSIS — Z3A00 Weeks of gestation of pregnancy not specified: Secondary | ICD-10-CM | POA: Insufficient documentation

## 2017-01-07 DIAGNOSIS — O0990 Supervision of high risk pregnancy, unspecified, unspecified trimester: Secondary | ICD-10-CM

## 2017-01-07 NOTE — Progress Notes (Signed)
  Patient was seen on 01/07/2017 for Gestational Diabetes self-management. She is here with her husband and Software engineer. She provided a diet history and states her activity is limited due to pregnancy.  The following learning objectives were met by the patient :   States the definition of Gestational Diabetes  States why dietary management is important in controlling blood glucose  Describes the effects of carbohydrates on blood glucose levels  Demonstrates ability to create a balanced meal plan  Demonstrates carbohydrate counting   States when to check blood glucose levels  Demonstrates proper blood glucose monitoring techniques  States the effect of stress and exercise on blood glucose levels  States the importance of limiting caffeine and abstaining from alcohol and smoking  Plan:  Aim for 3 Carb Choices per meal (45 grams) +/- 1 either way  Aim for 1-2 Carbs per snack Begin reading food labels for Total Carbohydrate of foods Consider  increasing your activity level by walking or other activity daily as tolerated Begin checking BG before breakfast and 2 hours after first bite of breakfast, lunch and dinner as directed by MD  Take medication if directed by MD  Patient already has a meter: Accu Chek with Fast Clix drum And is testing pre breakfast and 2 hours after lunch as of today. She now knows to test 2 hours after each meal as directed by MD Review of Log Book shows: verbal information from patient who states BG between 81 and 111 mg/dl  Patient instructed to monitor glucose levels: FBS: 60 - <90 2 hour: <120  Patient received the following handouts:  Nutrition Diabetes and Pregnancy  Carbohydrate Counting List  Patient will be seen for follow-up as needed.

## 2017-01-12 ENCOUNTER — Encounter: Payer: Self-pay | Admitting: Certified Nurse Midwife

## 2017-01-12 ENCOUNTER — Ambulatory Visit (INDEPENDENT_AMBULATORY_CARE_PROVIDER_SITE_OTHER): Payer: Medicaid Other | Admitting: Certified Nurse Midwife

## 2017-01-12 VITALS — BP 109/63 | HR 92 | Wt 215.0 lb

## 2017-01-12 DIAGNOSIS — R7989 Other specified abnormal findings of blood chemistry: Secondary | ICD-10-CM

## 2017-01-12 DIAGNOSIS — O0990 Supervision of high risk pregnancy, unspecified, unspecified trimester: Secondary | ICD-10-CM

## 2017-01-12 DIAGNOSIS — O24410 Gestational diabetes mellitus in pregnancy, diet controlled: Secondary | ICD-10-CM

## 2017-01-12 DIAGNOSIS — O0993 Supervision of high risk pregnancy, unspecified, third trimester: Secondary | ICD-10-CM

## 2017-01-12 DIAGNOSIS — E559 Vitamin D deficiency, unspecified: Secondary | ICD-10-CM

## 2017-01-12 DIAGNOSIS — Z603 Acculturation difficulty: Secondary | ICD-10-CM

## 2017-01-12 NOTE — Progress Notes (Signed)
   PRENATAL VISIT NOTE  Subjective:  Brittany Fritz is a 31 y.o. G3P1011 at [redacted]w[redacted]d being seen today for ongoing prenatal care.  She is currently monitored for the following issues for this high-risk pregnancy and has Immigrant with language difficulty; Supervision of high risk pregnancy, antepartum; Low vitamin D level; and GDM (gestational diabetes mellitus) on her problem list.  Patient reports no complaints.  Contractions: Not present. Vag. Bleeding: None.  Movement: Present. Denies leaking of fluid.   The following portions of the patient's history were reviewed and updated as appropriate: allergies, current medications, past family history, past medical history, past social history, past surgical history and problem list. Problem list updated.  CBGs reported by spouse: Fastings in the 80's 2 hour PP: low 100's   Objective:   Vitals:   01/12/17 1545  BP: 109/63  Pulse: 92  Weight: 215 lb (97.5 kg)    Fetal Status: Fetal Heart Rate (bpm): 141 Fundal Height: 34 cm Movement: Present     General:  Alert, oriented and cooperative. Patient is in no acute distress.  Skin: Skin is warm and dry. No rash noted.   Cardiovascular: Normal heart rate noted  Respiratory: Normal respiratory effort, no problems with respiration noted  Abdomen: Soft, gravid, appropriate for gestational age. Pain/Pressure: Present     Pelvic:  Cervical exam deferred        Extremities: Normal range of motion.  Edema: None  Mental Status: Normal mood and affect. Normal behavior. Normal judgment and thought content.   Assessment and Plan:  Pregnancy: G3P1011 at [redacted]w[redacted]d  1. Immigrant with language difficulty     Video translator used  2. Supervision of high risk pregnancy, antepartum      Doing well  3. Low vitamin D level     Taking weekly vitamin D  4. Diet controlled gestational diabetes mellitus (GDM) in third trimester      No log brought.    Preterm labor symptoms and general obstetric precautions  including but not limited to vaginal bleeding, contractions, leaking of fluid and fetal movement were reviewed in detail with the patient. Please refer to After Visit Summary for other counseling recommendations.  Return in about 2 weeks (around 01/26/2017) for [facility].   Roe Coombs, CNM

## 2017-01-12 NOTE — Progress Notes (Signed)
Arabic Interpreter- Darrick Meigs [phone]. Patient reports that her numbers are normal

## 2017-01-26 ENCOUNTER — Ambulatory Visit (INDEPENDENT_AMBULATORY_CARE_PROVIDER_SITE_OTHER): Payer: Medicaid Other | Admitting: Certified Nurse Midwife

## 2017-01-26 VITALS — BP 114/75 | HR 99 | Wt 216.0 lb

## 2017-01-26 DIAGNOSIS — E559 Vitamin D deficiency, unspecified: Secondary | ICD-10-CM

## 2017-01-26 DIAGNOSIS — R7989 Other specified abnormal findings of blood chemistry: Secondary | ICD-10-CM

## 2017-01-26 DIAGNOSIS — O0990 Supervision of high risk pregnancy, unspecified, unspecified trimester: Secondary | ICD-10-CM

## 2017-01-26 DIAGNOSIS — O24415 Gestational diabetes mellitus in pregnancy, controlled by oral hypoglycemic drugs: Secondary | ICD-10-CM

## 2017-01-26 MED ORDER — GLYBURIDE 2.5 MG PO TABS: 2.5000 mg | ORAL_TABLET | Freq: Every day | ORAL | 3 refills | Status: DC

## 2017-01-26 NOTE — Progress Notes (Signed)
   PRENATAL VISIT NOTE  Subjective:  Brittany Fritz is a 31 y.o. G3P1011 at [redacted]w[redacted]d being seen today for ongoing prenatal care.  She is currently monitored for the following issues for this high-risk pregnancy and has Immigrant with language difficulty; Supervision of high risk pregnancy, antepartum; Low vitamin D level; and GDM (gestational diabetes mellitus) on her problem list.  Patient reports no complaints.  Contractions: Not present. Vag. Bleeding: None.  Movement: Present. Denies leaking of fluid.   The following portions of the patient's history were reviewed and updated as appropriate: allergies, current medications, past family history, past medical history, past social history, past surgical history and problem list. Problem list updated.  Objective:   Vitals:   01/26/17 1549  BP: 114/75  Pulse: 99  Weight: 216 lb (98 kg)    Fetal Status: Fetal Heart Rate (bpm): 140 Fundal Height: 37 cm Movement: Present     General:  Alert, oriented and cooperative. Patient is in no acute distress.  Skin: Skin is warm and dry. No rash noted.   Cardiovascular: Normal heart rate noted  Respiratory: Normal respiratory effort, no problems with respiration noted  Abdomen: Soft, gravid, appropriate for gestational age. Pain/Pressure: Absent     Pelvic:  Cervical exam deferred        Extremities: Normal range of motion.     Mental Status: Normal mood and affect. Normal behavior. Normal judgment and thought content.    Blood Sugars:   Fastings: 80X1; 90-111; mostly elevated   2 hour PP: 85-110  Assessment and Plan:  Pregnancy: G3P1011 at [redacted]w[redacted]d  1. Gestational diabetes mellitus (GDM) in third trimester controlled on oral hypoglycemic drug     Start Glyburide today at HS with snack, discussed with Dr. Rande Lawman.   - glyBURIDE (DIABETA) 2.5 MG tablet; Take 1 tablet (2.5 mg total) by mouth at bedtime.  Dispense: 30 tablet; Refill: 3  2. Supervision of high risk pregnancy, antepartum      Start  antenatal testing next week.  F/U US growth scheduled for 02/04/17.     3. Low vitamin D level     Taking weekly vitamin D.  Preterm labor symptoms and general obstetric precautions including but not limited to vaginal bleeding, contractions, leaking of fluid and fetal movement were reviewed in detail with the patient. Please refer to After Visit Summary for other counseling recommendations.  Return in about 1 week (around 02/02/2017) for [facility], start biweekly NSTs, GBS: female providers needs to see FP here.   Roe Coombs, CNM

## 2017-02-02 ENCOUNTER — Ambulatory Visit (INDEPENDENT_AMBULATORY_CARE_PROVIDER_SITE_OTHER): Payer: Medicaid Other | Admitting: Obstetrics and Gynecology

## 2017-02-02 ENCOUNTER — Other Ambulatory Visit (HOSPITAL_COMMUNITY)
Admission: RE | Admit: 2017-02-02 | Discharge: 2017-02-02 | Disposition: A | Discharge status: Home / Self Care | Payer: Medicaid Other | Source: Ambulatory Visit | Attending: Obstetrics and Gynecology | Admitting: Obstetrics and Gynecology

## 2017-02-02 VITALS — BP 108/73 | HR 93 | Wt 216.0 lb

## 2017-02-02 DIAGNOSIS — O0990 Supervision of high risk pregnancy, unspecified, unspecified trimester: Secondary | ICD-10-CM

## 2017-02-02 DIAGNOSIS — O24415 Gestational diabetes mellitus in pregnancy, controlled by oral hypoglycemic drugs: Secondary | ICD-10-CM

## 2017-02-02 DIAGNOSIS — Z3493 Encounter for supervision of normal pregnancy, unspecified, third trimester: Secondary | ICD-10-CM

## 2017-02-02 LAB — OB RESULTS CONSOLE GC/CHLAMYDIA: Gonorrhea: NEGATIVE

## 2017-02-02 LAB — OB RESULTS CONSOLE GBS: STREP GROUP B AG: POSITIVE

## 2017-02-02 NOTE — Progress Notes (Signed)
   PRENATAL VISIT NOTE  Subjective:  Brittany Fritz is a 31 y.o. G3P1011 at 5252w4d being seen today for ongoing prenatal care.  She is currently monitored for the following issues for this high-risk pregnancy and has Immigrant with language difficulty; Supervision of high risk pregnancy, antepartum; Low vitamin D level; and GDM (gestational diabetes mellitus) on her problem list.  Patient reports no complaints.  Contractions: Not present. Vag. Bleeding: None.  Movement: Present. Denies leaking of fluid.   The following portions of the patient's history were reviewed and updated as appropriate: allergies, current medications, past family history, past medical history, past social history, past surgical history and problem list. Problem list updated.  Objective:   Vitals:   02/02/17 1452  BP: 108/73  Pulse: 93  Weight: 216 lb (98 kg)    Fetal Status: Fetal Heart Rate (bpm): 130 Fundal Height: 37 cm Movement: Present  Presentation: Vertex  General:  Alert, oriented and cooperative. Patient is in no acute distress.  Skin: Skin is warm and dry. No rash noted.   Cardiovascular: Normal heart rate noted  Respiratory: Normal respiratory effort, no problems with respiration noted  Abdomen: Soft, gravid, appropriate for gestational age. Pain/Pressure: Present     Pelvic:  Cervical exam performed Dilation: 3 Effacement (%): 50 Station: -3  Extremities: Normal range of motion.  Edema: None  Mental Status: Normal mood and affect. Normal behavior. Normal judgment and thought content.   Assessment and Plan:  Pregnancy: G3P1011 at 6252w4d  1. Prenatal care in third trimester Patient is doing well without complaints Cultures today Remind patient that we cannot always guarantee a female only delivery team - Strep Gp B NAA - GC[facility]Chlamydia probe amp ([place]  2. Supervision of high risk pregnancy, antepartum   3. Gestational diabetes mellitus (GDM) in third trimester controlled on  oral hypoglycemic drug CBGs reviewed and within range Patient has not been taking glyburide. She is consuming a much smaller dinner meal. If CBGs remain normal next visit will discontinue fetal testing and plan for IOL at 40 weeks Followup growth on 5/10 NST reviewed and reactive with baseline 130, mod variability, +accels, no decels  Preterm labor symptoms and general obstetric precautions including but not limited to vaginal bleeding, contractions, leaking of fluid and fetal movement were reviewed in detail with the patient. Please refer to After Visit Summary for other counseling recommendations.  No Follow-up on file.   Talia Hoheisel, Gigi GinPeggy, MD

## 2017-02-03 LAB — GC/CHLAMYDIA PROBE AMP (~~LOC~~) NOT AT ARMC
Chlamydia: NEGATIVE
Neisseria Gonorrhea: NEGATIVE

## 2017-02-04 ENCOUNTER — Ambulatory Visit (HOSPITAL_COMMUNITY)
Admission: RE | Admit: 2017-02-04 | Discharge: 2017-02-04 | Disposition: A | Discharge status: Home / Self Care | Payer: Medicaid Other | Source: Ambulatory Visit | Attending: Certified Nurse Midwife | Admitting: Certified Nurse Midwife

## 2017-02-04 ENCOUNTER — Encounter: Payer: Self-pay | Admitting: Obstetrics and Gynecology

## 2017-02-04 ENCOUNTER — Other Ambulatory Visit (HOSPITAL_COMMUNITY): Payer: Self-pay | Admitting: *Deleted

## 2017-02-04 ENCOUNTER — Encounter (HOSPITAL_COMMUNITY): Payer: Self-pay

## 2017-02-04 DIAGNOSIS — O99213 Obesity complicating pregnancy, third trimester: Secondary | ICD-10-CM | POA: Diagnosis not present

## 2017-02-04 DIAGNOSIS — O24419 Gestational diabetes mellitus in pregnancy, unspecified control: Secondary | ICD-10-CM

## 2017-02-04 DIAGNOSIS — O99820 Streptococcus B carrier state complicating pregnancy: Secondary | ICD-10-CM | POA: Insufficient documentation

## 2017-02-04 DIAGNOSIS — Z3A35 35 weeks gestation of pregnancy: Secondary | ICD-10-CM | POA: Diagnosis not present

## 2017-02-04 DIAGNOSIS — O0993 Supervision of high risk pregnancy, unspecified, third trimester: Secondary | ICD-10-CM | POA: Diagnosis not present

## 2017-02-04 DIAGNOSIS — E669 Obesity, unspecified: Secondary | ICD-10-CM | POA: Insufficient documentation

## 2017-02-04 DIAGNOSIS — O24415 Gestational diabetes mellitus in pregnancy, controlled by oral hypoglycemic drugs: Secondary | ICD-10-CM | POA: Insufficient documentation

## 2017-02-04 DIAGNOSIS — O3660X0 Maternal care for excessive fetal growth, unspecified trimester, not applicable or unspecified: Secondary | ICD-10-CM

## 2017-02-04 DIAGNOSIS — O0990 Supervision of high risk pregnancy, unspecified, unspecified trimester: Secondary | ICD-10-CM

## 2017-02-04 LAB — STREP GP B NAA: STREP GROUP B AG: POSITIVE — AB

## 2017-02-07 ENCOUNTER — Other Ambulatory Visit: Payer: Self-pay | Admitting: Certified Nurse Midwife

## 2017-02-07 DIAGNOSIS — O3660X0 Maternal care for excessive fetal growth, unspecified trimester, not applicable or unspecified: Secondary | ICD-10-CM | POA: Insufficient documentation

## 2017-02-07 DIAGNOSIS — O3663X0 Maternal care for excessive fetal growth, third trimester, not applicable or unspecified: Secondary | ICD-10-CM

## 2017-02-11 ENCOUNTER — Encounter: Payer: Medicaid Other | Admitting: Obstetrics and Gynecology

## 2017-02-15 ENCOUNTER — Ambulatory Visit (INDEPENDENT_AMBULATORY_CARE_PROVIDER_SITE_OTHER): Payer: Medicaid Other | Admitting: Obstetrics and Gynecology

## 2017-02-15 VITALS — BP 112/72 | HR 99 | Wt 214.4 lb

## 2017-02-15 DIAGNOSIS — O24415 Gestational diabetes mellitus in pregnancy, controlled by oral hypoglycemic drugs: Secondary | ICD-10-CM | POA: Diagnosis not present

## 2017-02-15 DIAGNOSIS — O99820 Streptococcus B carrier state complicating pregnancy: Secondary | ICD-10-CM

## 2017-02-15 DIAGNOSIS — O0993 Supervision of high risk pregnancy, unspecified, third trimester: Secondary | ICD-10-CM

## 2017-02-15 DIAGNOSIS — O0990 Supervision of high risk pregnancy, unspecified, unspecified trimester: Secondary | ICD-10-CM

## 2017-02-15 NOTE — Progress Notes (Signed)
GDM pt presents for ROB/NST; pt forgot CBG book today.

## 2017-02-15 NOTE — Progress Notes (Signed)
   PRENATAL VISIT NOTE  Subjective:  Brittany Fritz is a 31 y.o. G3P1011 at 285w3d being seen today for ongoing prenatal care.  She is currently monitored for the following issues for this high-risk pregnancy and has Immigrant with language difficulty; Supervision of high risk pregnancy, antepartum; Low vitamin D level; GDM (gestational diabetes mellitus); GBS (group B Streptococcus carrier), +RV culture, currently pregnant; and LGA (large for gestational age) fetus affecting management of mother on her problem list.  Patient reports no complaints.  Contractions: Not present. Vag. Bleeding: None.  Movement: Present. Denies leaking of fluid.   The following portions of the patient's history were reviewed and updated as appropriate: allergies, current medications, past family history, past medical history, past social history, past surgical history and problem list. Problem list updated.  Objective:   Vitals:   02/15/17 1309  BP: 112/72  Pulse: 99  Weight: 214 lb 6.4 oz (97.3 kg)    Fetal Status: Fetal Heart Rate (bpm): NST  Fundal Height: 38 cm Movement: Present     General:  Alert, oriented and cooperative. Patient is in no acute distress.  Skin: Skin is warm and dry. No rash noted.   Cardiovascular: Normal heart rate noted  Respiratory: Normal respiratory effort, no problems with respiration noted  Abdomen: Soft, gravid, appropriate for gestational age. Pain/Pressure: Absent     Pelvic:  Cervical exam deferred        Extremities: Normal range of motion.  Edema: Trace  Mental Status: Normal mood and affect. Normal behavior. Normal judgment and thought content.   Assessment and Plan:  Pregnancy: G3P1011 at 605w3d  1. Gestational diabetes mellitus (GDM) in third trimester controlled on oral hypoglycemic drug Patient did not bring log or meter but reports CBGs within range- diet control - Fetal nonstress test- reviewed and reactive with baseline 140, mod variability, +accels, no  decels GDM well controlled without medication- no need for further fetal testing Follow-up growth ultrasound on 5/31 Plan for IOL at 40 weeks  2. GBS (group B Streptococcus carrier), +RV culture, currently pregnant Will provide prophylaxis in labor  3. Supervision of high risk pregnancy, antepartum Patient is doing well without  complaints  Term labor symptoms and general obstetric precautions including but not limited to vaginal bleeding, contractions, leaking of fluid and fetal movement were reviewed in detail with the patient. Please refer to After Visit Summary for other counseling recommendations.  No Follow-up on file.   Catalina AntiguaPeggy Lanecia Sliva, MD

## 2017-02-25 ENCOUNTER — Ambulatory Visit (HOSPITAL_COMMUNITY)
Admission: RE | Admit: 2017-02-25 | Discharge: 2017-02-25 | Disposition: A | Discharge status: Home / Self Care | Payer: Medicaid Other | Source: Ambulatory Visit | Attending: Certified Nurse Midwife | Admitting: Certified Nurse Midwife

## 2017-02-25 ENCOUNTER — Other Ambulatory Visit (HOSPITAL_COMMUNITY): Payer: Self-pay | Admitting: Maternal & Fetal Medicine

## 2017-02-25 ENCOUNTER — Ambulatory Visit (INDEPENDENT_AMBULATORY_CARE_PROVIDER_SITE_OTHER): Payer: Medicaid Other | Admitting: Obstetrics and Gynecology

## 2017-02-25 VITALS — BP 117/74 | HR 96 | Wt 212.0 lb

## 2017-02-25 DIAGNOSIS — O99820 Streptococcus B carrier state complicating pregnancy: Secondary | ICD-10-CM

## 2017-02-25 DIAGNOSIS — O0993 Supervision of high risk pregnancy, unspecified, third trimester: Secondary | ICD-10-CM

## 2017-02-25 DIAGNOSIS — O24415 Gestational diabetes mellitus in pregnancy, controlled by oral hypoglycemic drugs: Secondary | ICD-10-CM | POA: Diagnosis present

## 2017-02-25 DIAGNOSIS — O3660X0 Maternal care for excessive fetal growth, unspecified trimester, not applicable or unspecified: Secondary | ICD-10-CM

## 2017-02-25 DIAGNOSIS — Z3A38 38 weeks gestation of pregnancy: Secondary | ICD-10-CM | POA: Diagnosis not present

## 2017-02-25 DIAGNOSIS — O0990 Supervision of high risk pregnancy, unspecified, unspecified trimester: Secondary | ICD-10-CM

## 2017-02-25 DIAGNOSIS — O3663X0 Maternal care for excessive fetal growth, third trimester, not applicable or unspecified: Secondary | ICD-10-CM

## 2017-02-25 NOTE — Progress Notes (Signed)
No complaints per pt. Growth u/s scheduled this afternoon.

## 2017-02-25 NOTE — Progress Notes (Signed)
   PRENATAL VISIT NOTE  Subjective:  Brittany Fritz is a 31 y.o. G3P1011 at 5636w6d being seen today for ongoing prenatal care.  She is currently monitored for the following issues for this high-risk pregnancy and has Immigrant with language difficulty; Supervision of high risk pregnancy, antepartum; Low vitamin D level; GDM (gestational diabetes mellitus); GBS (group B Streptococcus carrier), +RV culture, currently pregnant; and LGA (large for gestational age) fetus affecting management of mother on her problem list.  Patient reports no complaints.  Contractions: Not present. Vag. Bleeding: None.  Movement: Present. Denies leaking of fluid.   The following portions of the patient's history were reviewed and updated as appropriate: allergies, current medications, past family history, past medical history, past social history, past surgical history and problem list. Problem list updated.  Objective:   Vitals:   02/25/17 1330  BP: 117/74  Pulse: 96  Weight: 212 lb (96.2 kg)    Fetal Status: Fetal Heart Rate (bpm): 142   Movement: Present     General:  Alert, oriented and cooperative. Patient is in no acute distress.  Skin: Skin is warm and dry. No rash noted.   Cardiovascular: Normal heart rate noted  Respiratory: Normal respiratory effort, no problems with respiration noted  Abdomen: Soft, gravid, appropriate for gestational age. Pain/Pressure: Absent     Pelvic:  Cervical exam deferred        Extremities: Normal range of motion.  Edema: Trace  Mental Status: Normal mood and affect. Normal behavior. Normal judgment and thought content.   Assessment and Plan:  Pregnancy: G3P1011 at 3036w6d  1. GBS (group B Streptococcus carrier), +RV culture, currently pregnant Patient will receive prophylaxis in labor  2. Supervision of high risk pregnancy, antepartum Patient is doing well without complaints  3. Gestational diabetes mellitus (GDM) in third trimester controlled on oral hypoglycemic  drug Patient has been fasting for 16 hours for the past 16 days. She is without complaints Due to the fast, she consumes a small meal in the evening and reports pp in the low 100's Follow up growth ultrasound today Patient scheduled for IOL at 40 weeks. She strongly desires female provider only. Patient was informed that we will try to accommodate as best as we can  4. Excessive fetal growth affecting management of pregnancy in third trimester, single or unspecified fetus Follow up growth scan today  Term labor symptoms and general obstetric precautions including but not limited to vaginal bleeding, contractions, leaking of fluid and fetal movement were reviewed in detail with the patient. Please refer to After Visit Summary for other counseling recommendations.  Return in about 1 week (around 03/04/2017) for ROB.   Catalina AntiguaPeggy Geovany Trudo, MD

## 2017-03-01 ENCOUNTER — Encounter (HOSPITAL_COMMUNITY): Payer: Self-pay

## 2017-03-01 ENCOUNTER — Inpatient Hospital Stay (HOSPITAL_COMMUNITY)
Admission: AD | Admit: 2017-03-01 | Discharge: 2017-03-02 | DRG: 775 | Disposition: A | Discharge status: Home / Self Care | Payer: Medicaid Other | Source: Ambulatory Visit | Attending: Obstetrics and Gynecology | Admitting: Obstetrics and Gynecology

## 2017-03-01 DIAGNOSIS — Z880 Allergy status to penicillin: Secondary | ICD-10-CM | POA: Diagnosis not present

## 2017-03-01 DIAGNOSIS — O99824 Streptococcus B carrier state complicating childbirth: Secondary | ICD-10-CM | POA: Diagnosis present

## 2017-03-01 DIAGNOSIS — D649 Anemia, unspecified: Secondary | ICD-10-CM | POA: Diagnosis present

## 2017-03-01 DIAGNOSIS — O99820 Streptococcus B carrier state complicating pregnancy: Secondary | ICD-10-CM

## 2017-03-01 DIAGNOSIS — Z3493 Encounter for supervision of normal pregnancy, unspecified, third trimester: Secondary | ICD-10-CM | POA: Diagnosis present

## 2017-03-01 DIAGNOSIS — O9902 Anemia complicating childbirth: Secondary | ICD-10-CM | POA: Diagnosis present

## 2017-03-01 DIAGNOSIS — O3663X0 Maternal care for excessive fetal growth, third trimester, not applicable or unspecified: Secondary | ICD-10-CM | POA: Diagnosis present

## 2017-03-01 DIAGNOSIS — Z3A39 39 weeks gestation of pregnancy: Secondary | ICD-10-CM

## 2017-03-01 DIAGNOSIS — O24420 Gestational diabetes mellitus in childbirth, diet controlled: Secondary | ICD-10-CM | POA: Diagnosis present

## 2017-03-01 DIAGNOSIS — O24429 Gestational diabetes mellitus in childbirth, unspecified control: Secondary | ICD-10-CM

## 2017-03-01 LAB — CBC
HEMATOCRIT: 29.4 % — AB (ref 36.0–46.0)
Hemoglobin: 9.2 g/dL — ABNORMAL LOW (ref 12.0–15.0)
MCH: 22.2 pg — ABNORMAL LOW (ref 26.0–34.0)
MCHC: 31.3 g/dL (ref 30.0–36.0)
MCV: 70.8 fL — ABNORMAL LOW (ref 78.0–100.0)
Platelets: 138 10*3/uL — ABNORMAL LOW (ref 150–400)
RBC: 4.15 MIL/uL (ref 3.87–5.11)
RDW: 17.8 % — ABNORMAL HIGH (ref 11.5–15.5)
WBC: 6.1 10*3/uL (ref 4.0–10.5)

## 2017-03-01 LAB — GLUCOSE, CAPILLARY: GLUCOSE-CAPILLARY: 88 mg/dL (ref 65–99)

## 2017-03-01 LAB — TYPE AND SCREEN
ABO/RH(D): B POS
Antibody Screen: NEGATIVE

## 2017-03-01 LAB — RPR: RPR: NONREACTIVE

## 2017-03-01 MED ORDER — ONDANSETRON HCL 4 MG PO TABS: 4.0000 mg | ORAL_TABLET | ORAL | Status: DC | PRN

## 2017-03-01 MED ORDER — SOD CITRATE-CITRIC ACID 500-334 MG/5ML PO SOLN: 30.0000 mL | ORAL | Status: DC | PRN

## 2017-03-01 MED ORDER — LIDOCAINE HCL (PF) 1 % IJ SOLN
30.0000 mL | INTRAMUSCULAR | Status: DC | PRN
  Filled 2017-03-01: qty 30

## 2017-03-01 MED ORDER — ONDANSETRON HCL 4 MG/2ML IJ SOLN: 4.0000 mg | Freq: Four times a day (QID) | INTRAMUSCULAR | Status: DC | PRN

## 2017-03-01 MED ORDER — TERBUTALINE SULFATE 1 MG/ML IJ SOLN
0.2500 mg | Freq: Once | INTRAMUSCULAR | Status: DC | PRN
  Filled 2017-03-01: qty 1

## 2017-03-01 MED ORDER — OXYCODONE-ACETAMINOPHEN 5-325 MG PO TABS: 2.0000 | ORAL_TABLET | ORAL | Status: DC | PRN

## 2017-03-01 MED ORDER — OXYCODONE-ACETAMINOPHEN 5-325 MG PO TABS: 1.0000 | ORAL_TABLET | ORAL | Status: DC | PRN

## 2017-03-01 MED ORDER — BENZOCAINE-MENTHOL 20-0.5 % EX AERO
1.0000 "application " | INHALATION_SPRAY | CUTANEOUS | Status: DC | PRN
  Filled 2017-03-01: qty 56

## 2017-03-01 MED ORDER — DIPHENHYDRAMINE HCL 25 MG PO CAPS: 25.0000 mg | ORAL_CAPSULE | Freq: Four times a day (QID) | ORAL | Status: DC | PRN

## 2017-03-01 MED ORDER — SENNOSIDES-DOCUSATE SODIUM 8.6-50 MG PO TABS
2.0000 | ORAL_TABLET | ORAL | Status: DC
  Administered 2017-03-01: 2 via ORAL
  Filled 2017-03-01: qty 2

## 2017-03-01 MED ORDER — ACETAMINOPHEN 325 MG PO TABS: 650.0000 mg | ORAL_TABLET | ORAL | Status: DC | PRN

## 2017-03-01 MED ORDER — PRENATAL MULTIVITAMIN CH
1.0000 | ORAL_TABLET | Freq: Every day | ORAL | Status: DC
  Administered 2017-03-01 – 2017-03-02 (×2): 1 via ORAL
  Filled 2017-03-01 (×2): qty 1

## 2017-03-01 MED ORDER — DIBUCAINE 1 % RE OINT: 1.0000 "application " | TOPICAL_OINTMENT | RECTAL | Status: DC | PRN

## 2017-03-01 MED ORDER — TETANUS-DIPHTH-ACELL PERTUSSIS 5-2.5-18.5 LF-MCG/0.5 IM SUSP: 0.5000 mL | Freq: Once | INTRAMUSCULAR | Status: DC

## 2017-03-01 MED ORDER — OXYTOCIN BOLUS FROM INFUSION
500.0000 mL | Freq: Once | INTRAVENOUS | Status: AC
  Administered 2017-03-01: 500 mL via INTRAVENOUS

## 2017-03-01 MED ORDER — SIMETHICONE 80 MG PO CHEW: 80.0000 mg | CHEWABLE_TABLET | ORAL | Status: DC | PRN

## 2017-03-01 MED ORDER — FENTANYL CITRATE (PF) 100 MCG/2ML IJ SOLN
50.0000 ug | INTRAMUSCULAR | Status: DC | PRN
  Administered 2017-03-01: 50 ug via INTRAVENOUS
  Filled 2017-03-01: qty 2

## 2017-03-01 MED ORDER — IBUPROFEN 600 MG PO TABS
600.0000 mg | ORAL_TABLET | Freq: Four times a day (QID) | ORAL | Status: DC
  Administered 2017-03-01 – 2017-03-02 (×4): 600 mg via ORAL
  Filled 2017-03-01 (×4): qty 1

## 2017-03-01 MED ORDER — LACTATED RINGERS IV SOLN
INTRAVENOUS | Status: DC
  Administered 2017-03-01 (×2): via INTRAVENOUS

## 2017-03-01 MED ORDER — ZOLPIDEM TARTRATE 5 MG PO TABS: 5.0000 mg | ORAL_TABLET | Freq: Every evening | ORAL | Status: DC | PRN

## 2017-03-01 MED ORDER — LACTATED RINGERS IV SOLN: 500.0000 mL | INTRAVENOUS | Status: DC | PRN

## 2017-03-01 MED ORDER — CLINDAMYCIN PHOSPHATE 900 MG/50ML IV SOLN
900.0000 mg | Freq: Once | INTRAVENOUS | Status: AC
  Administered 2017-03-01: 900 mg via INTRAVENOUS
  Filled 2017-03-01: qty 50

## 2017-03-01 MED ORDER — ONDANSETRON HCL 4 MG/2ML IJ SOLN: 4.0000 mg | INTRAMUSCULAR | Status: DC | PRN

## 2017-03-01 MED ORDER — OXYTOCIN 40 UNITS IN LACTATED RINGERS INFUSION - SIMPLE MED
2.5000 [IU]/h | INTRAVENOUS | Status: DC
  Filled 2017-03-01: qty 1000

## 2017-03-01 MED ORDER — FLEET ENEMA 7-19 GM/118ML RE ENEM: 1.0000 | ENEMA | RECTAL | Status: DC | PRN

## 2017-03-01 MED ORDER — COCONUT OIL OIL: 1.0000 "application " | TOPICAL_OIL | Status: DC | PRN

## 2017-03-01 MED ORDER — WITCH HAZEL-GLYCERIN EX PADS: 1.0000 "application " | MEDICATED_PAD | CUTANEOUS | Status: DC | PRN

## 2017-03-01 MED ORDER — CLINDAMYCIN PHOSPHATE 900 MG/50ML IV SOLN
900.0000 mg | Freq: Three times a day (TID) | INTRAVENOUS | Status: DC
  Filled 2017-03-01 (×2): qty 50

## 2017-03-01 NOTE — MAU Note (Signed)
Pt c/o contractions that started at midnight and are every 5 minutes. Denies LOF or vag bleeding. Cervix was 3cm on last exam. Reports good fetal movement.

## 2017-03-01 NOTE — H&P (Signed)
Brittany Fritz is a 31 y.o. female G3P1011 at 39.3 weeks presenting for spontaneous onset of labor. She reports regular contractions that started at 2100. She denies leaking of fluid or vaginal bleeding. Reports good fetal movement. Patient is GDMA1, prescribed glyburide but not taking it. EFW on 5/31 was 9lb 1oz, >90%.  Patient speaks Arabic and prefers female providers.  Clinic  [facility] Prenatal Labs  Dating  LMP Blood type: B/Positive/-- (11/27 1656)   Genetic Screen 1 Screen:    AFP:  negative   Quad:     NIPS:mat21:normal Antibody:Negative (11/27 1656)  Anatomic [place] Normal [place] @19  wks; female fetus; dates c/w LMP Rubella: 16.00 (11/27 1656)  GTT  Third trimester: GDM RPR: Non Reactive (11/27 1656)   Flu vaccine Declines HBsAg: Negative (11/27 1656)   TDaP vaccine   Declined                                          Rhogam:n/a B+ HIV: Non Reactive (11/27 1656)   Baby Food   breast                                            GBS: pos (For PCN allergy, check sensitivities)  Contraception none Pap:01/23/16:normal  Circumcision yes   Pediatrician [facility]   Support Person FOB     OB History    Gravida Para Term Preterm AB Living   3 1 1   1 1    SAB TAB Ectopic Multiple Live Births   1       1     Past Medical History:  Diagnosis Date  . Allergy    Seasonal  . Medical history non-contributory    Past Surgical History:  Procedure Laterality Date  . NO PAST SURGERIES     Family History: family history includes Heart disease in her father; Stroke in her father. Social History:  reports that she has never smoked. She has never used smokeless tobacco. She reports that she does not drink alcohol or use drugs.     Maternal Diabetes: Yes:  Diabetes Type:  Diet controlled Genetic Screening: Normal Maternal Ultrasounds/Referrals: Normal Fetal Ultrasounds or other Referrals:  None Maternal Substance Abuse:  No Significant Maternal Medications:  None Significant Maternal Lab  Results:  Lab values include: Group B Strep positive Other Comments:  None  Review of Systems  Constitutional: Negative.  Negative for chills and fever.  Respiratory: Negative.  Negative for sputum production.   Cardiovascular: Negative.  Negative for chest pain.  Gastrointestinal: Positive for abdominal pain.  Neurological: Negative.    Maternal Medical History:  Reason for admission: Contractions.   Contractions: Onset was 6-12 hours ago.   Frequency: regular.   Duration is approximately 60 seconds.   Perceived severity is moderate.    Fetal activity: Perceived fetal activity is normal.   Last perceived fetal movement was within the past hour.    Prenatal complications: no prenatal complications Prenatal Complications - Diabetes: gestational. Diabetes is managed by diet.   Prescribed glyburide but did not take  Dilation: 6.5 Effacement (%): 70 Station: -1 Exam by:: Brittany Drone RN Blood pressure 112/70, pulse 87, temperature 98.1 F (36.7 C), temperature source Oral, resp. rate 18, height 5' 4.57" (1.64 m), weight 216 lb (98  kg), last menstrual period 05/29/2016, unknown if currently breastfeeding. Maternal Exam:  Uterine Assessment: Contraction strength is moderate.  Contraction duration is 60 seconds. Contraction frequency is regular.   Abdomen: Patient reports no abdominal tenderness. Estimated fetal weight is 9lbs 1oz on 5/31.   Fetal presentation: vertex  Introitus: Normal vulva. Normal vagina.  Ferning test: not done.  Nitrazine test: not done. Amniotic fluid character: not assessed.  Cervix: Cervix evaluated by digital exam.     Fetal Exam Fetal Monitor Review: Mode: ultrasound.   Baseline rate: 120.  Variability: moderate (6-25 bpm).   Pattern: accelerations present and no decelerations.    Fetal State Assessment: Category I - tracings are normal.     Physical Exam  Nursing note and vitals reviewed. Constitutional: She appears well-developed and  well-nourished.  HENT:  Head: Normocephalic and atraumatic.  Eyes: Conjunctivae are normal. No scleral icterus.  Respiratory: Effort normal. No respiratory distress.  Neurological: She is alert.  Skin: Skin is warm and dry.  Psychiatric: She has a normal mood and affect. Her behavior is normal. Judgment and thought content normal.    Prenatal labs: ABO, Rh: B/Positive/-- (11/27 1656) Antibody: Negative (11/27 1656) Rubella: 16.00 (11/27 1656) RPR: Non Reactive (03/20 1105)  HBsAg: Negative (11/27 1656)  HIV: Non Reactive (03/20 1105)  GBS: Positive (05/08 1545)   Assessment/Plan: IUP at term. Spontaneous onset of labor. GBS pos  Admit to birthing suites Clindamycin for GBS prophylaxis Manage expectantly  Brittany Fritz SNM 03/01/2017, 4:46 AM   Midwife attestation: I have seen and examined this patient; I agree with above documentation in the student's note.   Brittany Fritz is a 31 y.o. G3P1011 here for labor  PE: Gen: calm comfortable, NAD Resp: normal effort, no distress Abd: gravid  ROS, labs, PMH reviewed  Assessment/Plan: 39 weeks A1GDM Admit to LD Labor: early active FWB: Cat I ID: GBS pos-PCN allergy>Clinda  Brittany Fritz, CNM  03/01/2017, 5:52 AM

## 2017-03-01 NOTE — Anesthesia Pain Management Evaluation Note (Signed)
  CRNA Pain Management Visit Note  Patient: Brittany Fritz, 31 y.o., female  "Hello I am a member of the anesthesia team at [facility]. We have an anesthesia team available at all times to provide care throughout the hospital, including epidural management and anesthesia for C-section. I don't know your plan for the delivery whether it a natural birth, water birth, IV sedation, nitrous supplementation, doula or epidural, but we want to meet your pain goals."   1.Was your pain managed to your expectations on prior hospitalizations?   Yes   2.What is your expectation for pain management during this hospitalization?     Labor support without medications  3.How can we help you reach that goal? Natural labor. Per patient and husband, patient does not want pain control interventions. Arabic interpreter video utilized. All questions answered to their satisfaction using interpreter.  Record the patient's initial score and the patient's pain goal.   Pain: 8  Pain Goal: 8 [facility] wants you to be able to say your pain was always managed very well.  Taraann Olthoff L 03/01/2017

## 2017-03-01 NOTE — Lactation Note (Signed)
This note was copied from a baby's chart. Lactation Consultation Note  Patient Name: Brittany Fritz FAOZH'YToday's Date: 03/01/2017   RN stated mom had some bruising.  LC gave RN comfort gels to give to pt.    Lendon KaVann, Naia Ruff Walker 03/01/2017, 11:43 PM

## 2017-03-01 NOTE — Lactation Note (Signed)
This note was copied from a baby's chart. Lactation Consultation Note  Patient Name: Brittany Fritz ZOXWR'UToday's Date: 03/01/2017 Reason for consult: Initial assessment   Initial consult at 5 hrs old; Mom is a P2 with 4 months experience breastfeeding previous baby with some nipple pain and bleeding with first.   Mom is a [place] refugee speaks Arabic but can also speak some [place] and understand some [place].   Mom stated she did not need an interpreter, so LC was able to assist mom with breastfeeding and do basic teaching. Mom Hx GDM on Glyburide.   Infant GA 39.3; BW 9 lbs, 2.7 oz. Lab was entering room at same time as LC, so we put baby STS with mom and mom independently latched infant with minimal assistance from LC (chin tug).  Mom using cradle hold with good positioning and support of breast on right breast. Infant fed with consistent sucking, several swallows heard.  Did not get to teach hand expression at this time.  LS-9.  Infant was still on breast when LC left room 20 minutes later. Encouraged mom to watch for wide mouth and flanged lips for good depth to eliminate potential issues of nipple pain. Mom very receptive and appreciative and understood teaching. Educated on stomach size, cluster feeding, and continuing to feed with feeding cues. Lactation brochure given and informed of hospital support group and OP services. Encouraged to call for assistance as needed with feedings.     Maternal Data Formula Feeding for Exclusion: No Does the patient have breastfeeding experience prior to this delivery?: Yes (4 months experience with first child)  Feeding Feeding Type: Breast Fed  LATCH Score/Interventions Latch: Grasps breast easily, tongue down, lips flanged, rhythmical sucking.  Audible Swallowing: Spontaneous and intermittent Intervention(s): Skin to skin;Hand expression  Type of Nipple: Everted at rest and after stimulation  Comfort (Breast/Nipple): Soft / non-tender      Hold (Positioning): Assistance needed to correctly position infant at breast and maintain latch. (minimal assistance from LC; chin tug) Intervention(s): Breastfeeding basics reviewed;Support Pillows;Skin to skin  LATCH Score: 9  Lactation Tools Discussed/Used WIC Program: Yes   Consult Status Consult Status: Follow-up Date: 03/02/17 Follow-up type: In-patient    Lendon KaVann, Arletta Lumadue Walker 03/01/2017, 5:39 PM

## 2017-03-02 MED ORDER — SENNOSIDES-DOCUSATE SODIUM 8.6-50 MG PO TABS: 1.0000 | ORAL_TABLET | Freq: Every evening | ORAL | 0 refills | Status: DC | PRN

## 2017-03-02 MED ORDER — IBUPROFEN 600 MG PO TABS: 600.0000 mg | ORAL_TABLET | Freq: Four times a day (QID) | ORAL | 0 refills | Status: DC

## 2017-03-02 MED ORDER — FERROUS SULFATE 325 (65 FE) MG PO TABS
325.0000 mg | ORAL_TABLET | Freq: Three times a day (TID) | ORAL | Status: DC
  Administered 2017-03-02: 325 mg via ORAL
  Filled 2017-03-02: qty 1

## 2017-03-02 NOTE — Progress Notes (Signed)
Post Partum Day #1 Subjective: no complaints, up ad lib, voiding and tolerating PO  Objective: Blood pressure (!) 110/56, pulse 87, temperature 98.2 F (36.8 C), temperature source Oral, resp. rate 18, height 5' 4.57" (1.64 m), weight 216 lb (98 kg), last menstrual period 05/29/2016, unknown if currently breastfeeding.  Physical Exam:  General: alert, cooperative and no distress Lochia: appropriate Uterine Fundus: firm Incision: 1st deg; healing DVT Evaluation: No evidence of DVT seen on physical exam. No cords or calf tenderness. No significant calf/ankle edema.   Recent Labs  03/01/17 0353  HGB 9.2*  HCT 29.4*    Assessment/Plan: Plan for discharge tomorrow, Breastfeeding and Contraception none.  GDMA2: refused Glyburide: fasting CBG on 03/01/17: 88. Anemia: iron started, asymptomatic.    LOS: 1 day   Roe CoombsRachelle A Denney, CNM 03/02/2017, 7:29 AM

## 2017-03-02 NOTE — Progress Notes (Signed)
UR chart review completed.  

## 2017-03-02 NOTE — Discharge Instructions (Signed)

## 2017-03-02 NOTE — Discharge Summary (Signed)
OB Discharge Summary     Patient Name: Brittany Fritz DOB: [birth year] MRN: [id number]  Date of admission: 03/01/2017 Delivering MD: Manya Silvas   Date of discharge: 03/02/2017  Admitting diagnosis: 37 WEEKS PAIN Intrauterine pregnancy: [redacted]w[redacted]d    Secondary diagnosis:  Active Problems:   Normal labor  Additional problems:  Patient Active Problem List   Diagnosis Date Noted  . Normal labor 03/01/2017  . LGA (large for gestational age) fetus affecting management of mother 02/07/2017  . GBS (group B Streptococcus carrier), +RV culture, currently pregnant 02/04/2017  . GDM (gestational diabetes mellitus) 12/17/2016  . Low vitamin D level 08/31/2016  . Supervision of high risk pregnancy, antepartum 08/24/2016  . Immigrant with language difficulty 01/23/2016        [facility]: [facility]  Onset of Labor With Vaginal Delivery     31y.o. yo [id number] 362w3das admitted in Active Labor on 03/01/2017. Patient had an uncomplicated labor course as follows:  Membrane Rupture Time/Date: 11:02 AM ,03/01/2017   Intrapartum Procedures: Episiotomy: None [1]                                         Lacerations:  1st degree [2]  Patient had a delivery of a Viable infant. 03/01/2017  Information for the patient's newborn:  AlHydeia, Mcatee0[[id number]]Delivery Method: Vaginal, Spontaneous Delivery (Filed from Delivery Summary)    Pateint had an uncomplicated postpartum course.  She is ambulating, tolerating a regular diet, passing flatus, and urinating well. Patient is discharged home in stable condition on 03/02/17.   Physical exam  Vitals:   03/01/17 1347 03/01/17 1450 03/01/17 1854 03/01/17 2337  BP: (!) 109/56 (!) 112/59 (!) 97/54 (!) 110/56  Pulse: 65 68 88 87  Resp: 18 16 18 18   Temp: 98.5 F (36.9 C) 98.5 F (36.9 C) 98.2 F (36.8 C) 98.2 F (36.8 C)  TempSrc: Oral Oral Oral Oral  Weight:       Height:       Please see exam from progress note on the day of discharge.  Labs: Lab Results  Component Value Date   WBC 6.1 03/01/2017   HGB 9.2 (L) 03/01/2017   HCT 29.4 (L) 03/01/2017   MCV 70.8 (L) 03/01/2017   PLT 138 (L) 03/01/2017   CMP Latest Ref Rng & Units 01/23/2016  Glucose 65 - 99 mg/dL 82  BUN 6 - 20 mg/dL 6  Creatinine 0.57 - 1.00 mg/dL 0.62  Sodium 134 - 144 mmol/L 141  Potassium 3.5 - 5.2 mmol/L 4.3  Chloride 96 - 106 mmol/L 97  CO2 18 - 29 mmol/L 24  Calcium 8.7 - 10.2 mg/dL 9.2  Total Protein 6.0 - 8.5 g/dL 7.4  Total Bilirubin 0.0 - 1.2 mg/dL 0.3  Alkaline Phos 39 - 117 IU/L 102  AST 0 - 40 IU/L 15  ALT 0 - 32 IU/L 12    Discharge instruction: per After Visit Summary and "Baby and Me Booklet".  After visit meds:  Allergies as of 03/02/2017      Reactions   Penicillins Itching, Rash  Has patient had a PCN reaction causing immediate rash, facial/tongue/throat swelling, SOB or lightheadedness with hypotension: Yes Has patient had a PCN reaction causing severe rash involving mucus membranes or skin necrosis: No Has patient had a PCN reaction that required hospitalization No Has patient had a PCN reaction occurring within the last 10 years: Yes If all of the above answers are "NO", then may proceed with Cephalosporin use.      Medication List    STOP taking these medications   ACCU-CHEK GUIDE w/Device Kit   glyBURIDE 2.5 MG tablet Commonly known as:  DIABETA   VITAFOL-NANO 18-0.6-0.4 MG Tabs     TAKE these medications   ferrous gluconate 240 (27 FE) MG tablet Commonly known as:  IRON 27 Take 1 tablet (240 mg total) by mouth 2 (two) times daily.   ibuprofen 600 MG tablet Commonly known as:  ADVIL,MOTRIN Take 1 tablet (600 mg total) by mouth every 6 (six) hours.   NATACHEW 28-1 MG Chew Chew 1 tablet by mouth at bedtime.   omeprazole 20 MG capsule Commonly known as:  PRILOSEC Take 1 capsule (20 mg total) by mouth 2 (two) times daily  before a meal.   senna-docusate 8.6-50 MG tablet Commonly known as:  Senokot-S Take 1 tablet by mouth at bedtime as needed for mild constipation.       Diet: routine diet  Activity: Advance as tolerated. Pelvic rest for 6 weeks.   Outpatient follow up:6 weeks Follow up Appt:No future appointments. Follow up Visit:No Follow-up on file.  Postpartum contraception: None (declines)  Newborn Data: Live born female  Birth Weight: 9 lb 2.7 oz (4160 g) APGAR: 8, 9  Baby Feeding: Breast Disposition:home with mother   03/02/2017 Everrett Coombe, MD

## 2017-03-04 ENCOUNTER — Encounter: Payer: Medicaid Other | Admitting: Certified Nurse Midwife

## 2017-03-05 ENCOUNTER — Inpatient Hospital Stay (HOSPITAL_COMMUNITY): Admission: RE | Admit: 2017-03-05 | Payer: Medicaid Other | Source: Ambulatory Visit

## 2017-04-21 ENCOUNTER — Ambulatory Visit (INDEPENDENT_AMBULATORY_CARE_PROVIDER_SITE_OTHER): Payer: Medicaid Other | Admitting: Certified Nurse Midwife

## 2017-04-21 VITALS — BP 101/69 | HR 103 | Wt 203.0 lb

## 2017-04-21 DIAGNOSIS — Z3169 Encounter for other general counseling and advice on procreation: Secondary | ICD-10-CM

## 2017-04-21 DIAGNOSIS — O0991 Supervision of high risk pregnancy, unspecified, first trimester: Secondary | ICD-10-CM

## 2017-04-21 DIAGNOSIS — Z603 Acculturation difficulty: Secondary | ICD-10-CM

## 2017-04-21 MED ORDER — NATACHEW 28-1 MG PO CHEW: 1.0000 | CHEWABLE_TABLET | Freq: Every day | ORAL | 12 refills | Status: DC

## 2017-04-21 MED ORDER — FOLIC ACID 1 MG PO TABS: 4.0000 mg | ORAL_TABLET | Freq: Every day | ORAL | 12 refills | Status: DC

## 2017-04-21 NOTE — Progress Notes (Signed)
Post Partum Exam  Brittany CollegeSarah Fritz is a 31 y.o. 143P2012 female who presents for a postpartum visit. She is 7 weeks postpartum following a spontaneous vaginal delivery. I have fully reviewed the prenatal and intrapartum course. The delivery was at 39.3 gestational weeks.  Anesthesia: none. Postpartum course has been doing well. Baby's course has been doing well. Baby is feeding by both breast and bottle - Similac Advance. Bleeding no bleeding. Bowel function is normal. Bladder function is normal. Patient is sexually active.  Contraception method is none. Desires pregnancy.  States that she has abdominal cramping, no period yet since delivery.  Stopped bleeding around 2 weeks postpartum.  Video interpreter used.  Postpartum depression screening:neg  The following portions of the patient's history were reviewed and updated as appropriate: allergies, current medications, past family history, past medical history, past social history, past surgical history and problem list.  Review of Systems Pertinent items noted in HPI and remainder of comprehensive ROS otherwise negative.    Objective:  unknown if currently breastfeeding.  General:  alert, cooperative and no distress   Breasts:  inspection negative, no nipple discharge or bleeding, no masses or nodularity palpable  Lungs: clear to auscultation bilaterally  Heart:  regular rate and rhythm, S1, S2 normal, no murmur, click, rub or gallop  Abdomen: soft, non-tender; bowel sounds normal; no masses,  no organomegaly   Vulva:  normal  Vagina: normal vagina  Cervix:  no bleeding following Pap  Corpus: normal size, contour, position, consistency, mobility, non-tender  Adnexa:  normal adnexa  Rectal Exam: Not performed.        Assessment:    Normal 7 week postpartum exam. Pap smear done at today's visit.   Preconception counseling  Plan:   1. Contraception: none 2. 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  Scheduling 2 hour OGTT.   3. Follow up  in: 6 months or as needed.

## 2017-04-22 ENCOUNTER — Other Ambulatory Visit (HOSPITAL_COMMUNITY)
Admission: RE | Admit: 2017-04-22 | Discharge: 2017-04-22 | Disposition: A | Discharge status: Home / Self Care | Payer: Medicaid Other | Source: Ambulatory Visit | Attending: Certified Nurse Midwife | Admitting: Certified Nurse Midwife

## 2017-04-22 ENCOUNTER — Encounter: Payer: Self-pay | Admitting: Certified Nurse Midwife

## 2017-04-23 ENCOUNTER — Other Ambulatory Visit: Payer: Medicaid Other

## 2017-04-23 LAB — CERVICOVAGINAL ANCILLARY ONLY
Bacterial vaginitis: NEGATIVE
CHLAMYDIA, DNA PROBE: NEGATIVE
Candida vaginitis: NEGATIVE
NEISSERIA GONORRHEA: NEGATIVE
TRICH (WINDOWPATH): NEGATIVE

## 2017-04-26 LAB — CYTOLOGY - PAP: DIAGNOSIS: NEGATIVE

## 2017-04-30 ENCOUNTER — Other Ambulatory Visit: Payer: Medicaid Other

## 2017-04-30 DIAGNOSIS — O24439 Gestational diabetes mellitus in the puerperium, unspecified control: Secondary | ICD-10-CM

## 2017-05-01 LAB — GLUCOSE TOLERANCE, 2 HOURS W/ 1HR
GLUCOSE, 1 HOUR: 263 mg/dL — AB (ref 65–179)
GLUCOSE, 2 HOUR: 87 mg/dL (ref 65–152)
GLUCOSE, FASTING: 100 mg/dL — AB (ref 65–91)

## 2017-05-04 ENCOUNTER — Other Ambulatory Visit: Payer: Self-pay | Admitting: Certified Nurse Midwife

## 2017-05-04 DIAGNOSIS — E119 Type 2 diabetes mellitus without complications: Secondary | ICD-10-CM

## 2017-05-07 ENCOUNTER — Telehealth: Payer: Self-pay

## 2017-05-07 NOTE — Telephone Encounter (Signed)
Patient notified of results, referral,diet and exercise. Verbalized understanding.

## 2017-05-07 NOTE — Telephone Encounter (Signed)
Patient called back to get clarification, I called the language line for Arabic Interpreter [phone] who went over results and instructions with patient. Patient verbalized understanding.

## 2017-05-07 NOTE — Telephone Encounter (Signed)
-----   Message from Roe Coombsachelle A Denney, CNM sent at 05/04/2017  3:08 PM EDT ----- Please let her know that her 2 hour was abnormal and that she most likely has type 2 DM.  I have sent an internal medicine referral for her so that they can manage this for her.  Please encourage weight loss, diet and exercise.  Thank you.  Rachelle

## 2017-05-20 IMAGING — US US MFM OB DETAIL+14 WK
1 series · 14 of 28 positions shown · non-contrast
Comparison: none

[Series 1: us mfm ob detail+14 wk · 88 acquisitions, 14 frames shown]
[im 4/88]
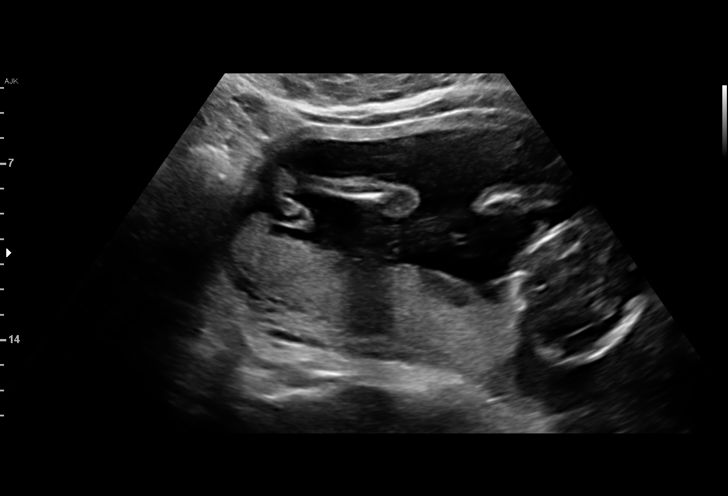
[im 10/88]
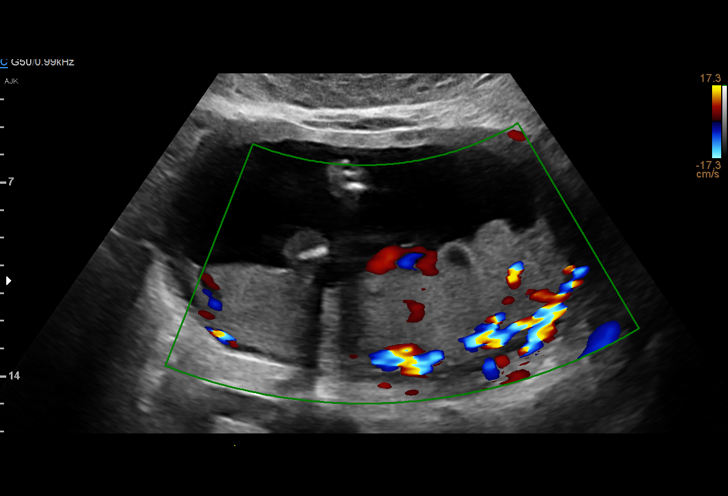
[im 17/88]
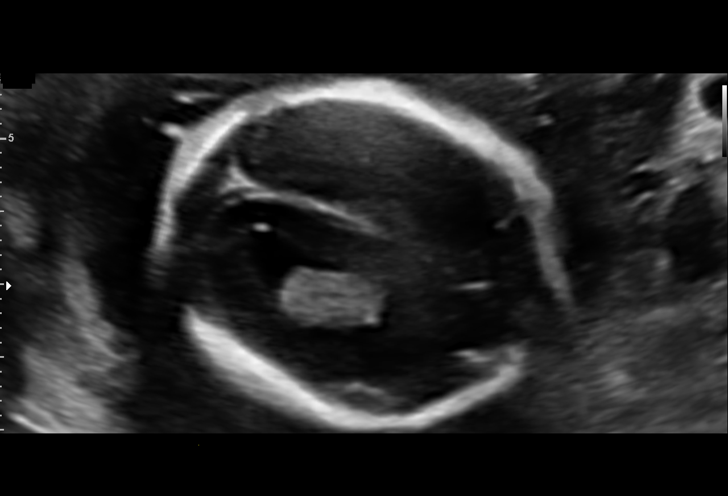
[im 23/88]
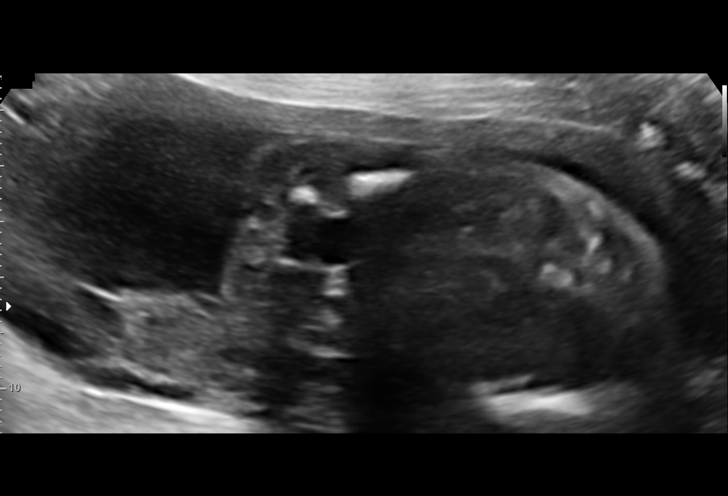
[im 30/88]
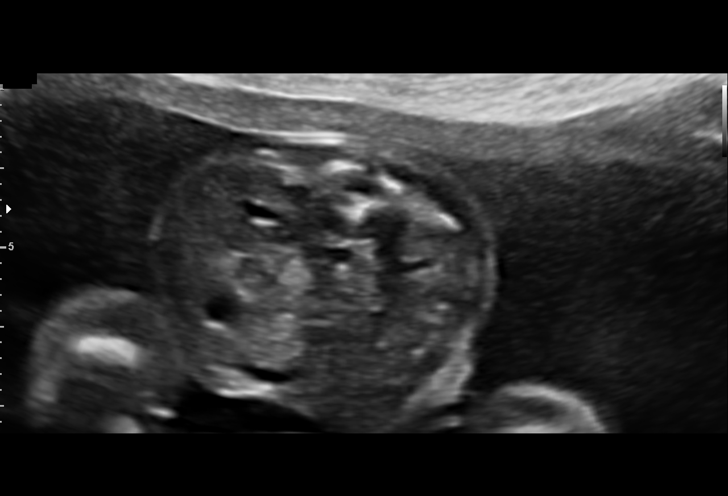
[im 36/88]
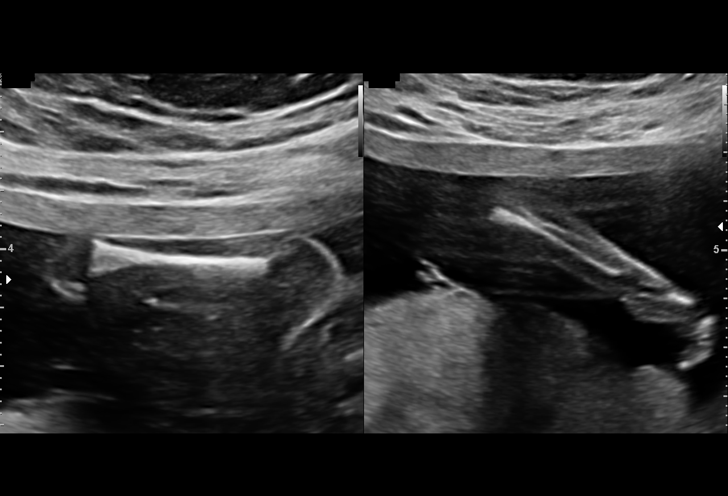
[im 42/88]
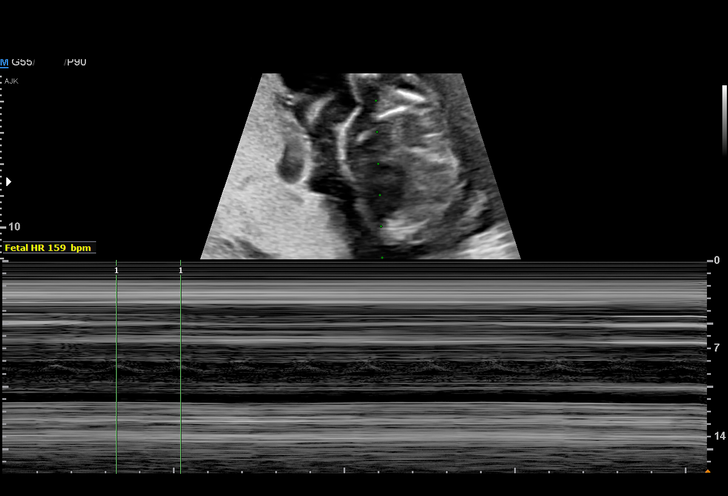
[im 49/88]
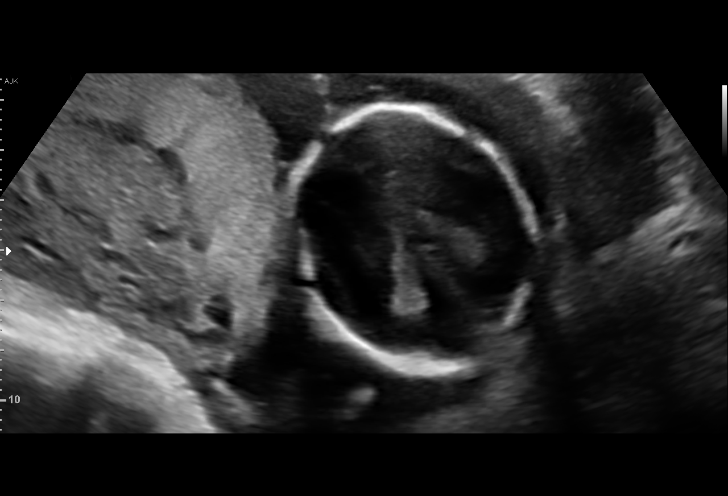
[im 55/88]
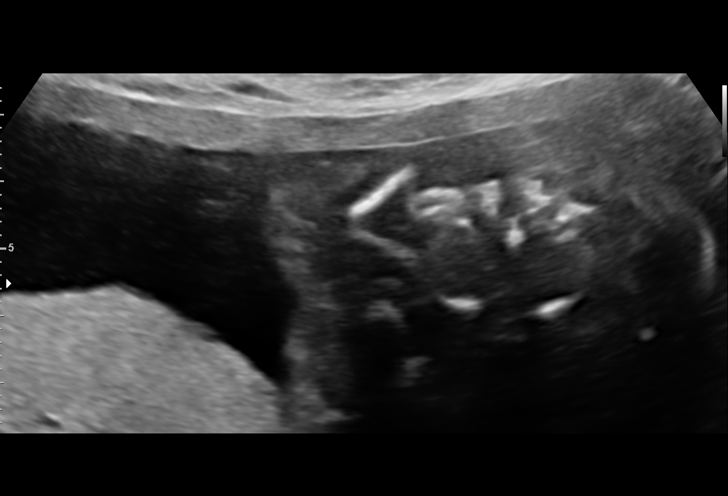
[im 62/88]
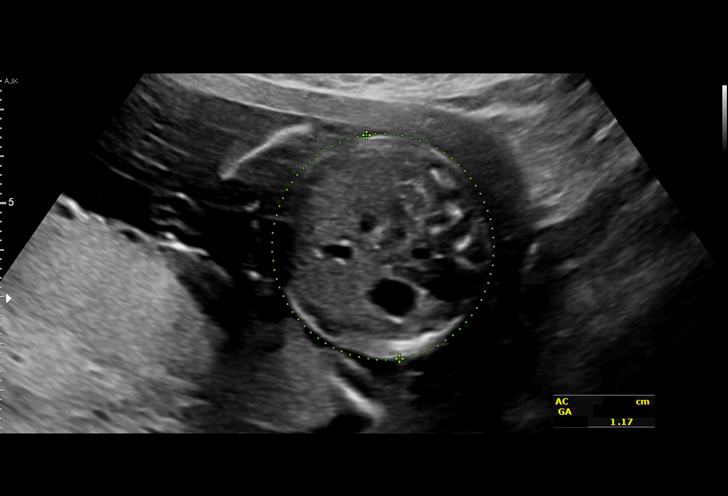
[im 68/88]
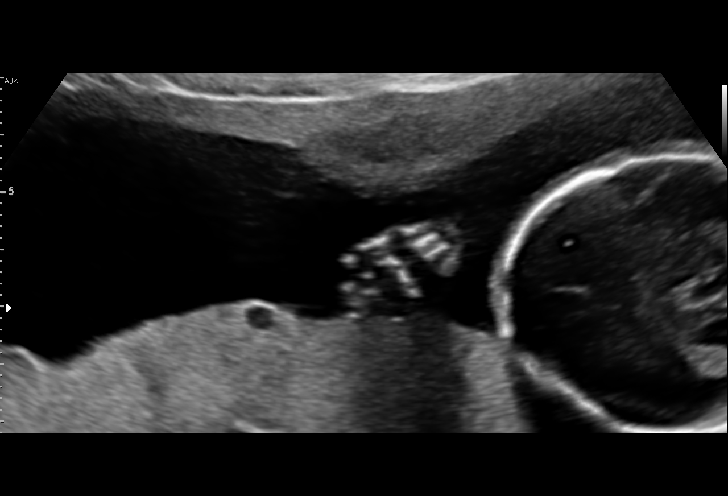
[im 75/88]
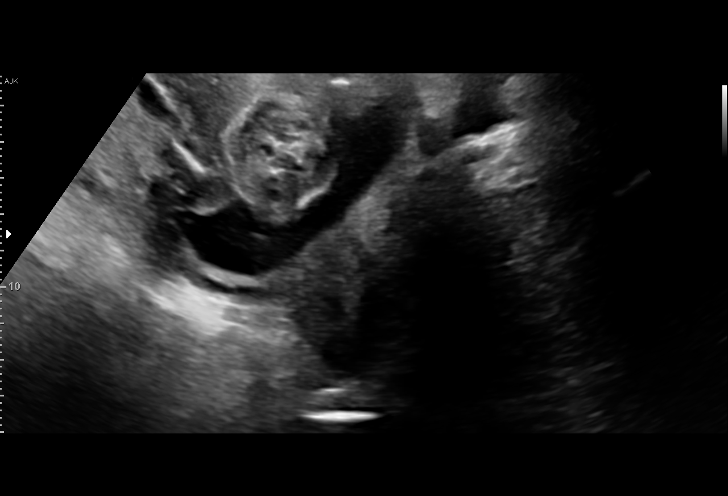
[im 81/88]
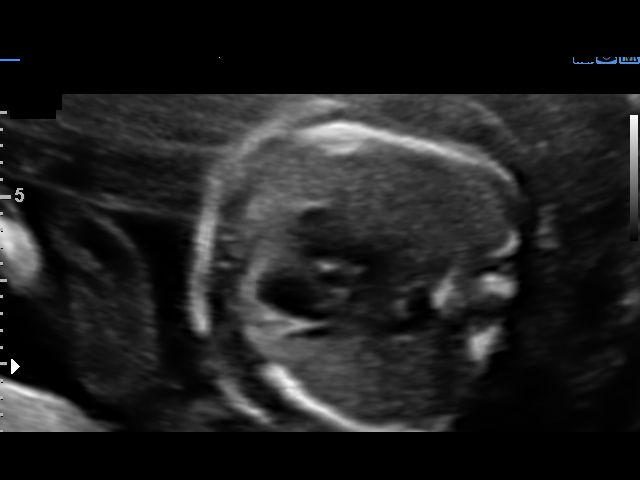
[im 88/88]
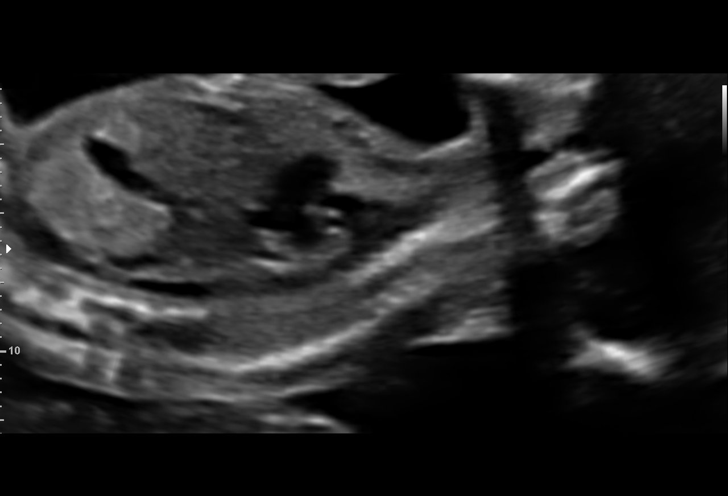

[14 of 28 positions shown; findings below may reference images not displayed]

Road [HOSPITAL]

Indications

19 weeks gestation of pregnancy
Encounter for antenatal screening for
malformations
Obesity complicating pregnancy, second
trimester
OB History

Blood Type:            Height:  5'1"   Weight (lb):  207      BMI:
Gravidity:    3         Term:   1        Prem:   0        SAB:   1
TOP:          0       Ectopic:  0        Living: 1
Fetal Evaluation

Num Of Fetuses:     1
Fetal Heart         159
Rate(bpm):
Cardiac Activity:   Observed
Presentation:       Variable
Placenta:           Posterior, above cervical os
P. Cord Insertion:  Visualized, central

Amniotic Fluid
AFI FV:      Subjectively within normal limits

Largest Pocket(cm)
4.9
Biometry

BPD:        48  mm     G. Age:  20w 4d         96  %    CI:        73.35   %   70 - 86
FL/HC:      17.5   %   16.1 -
HC:      178.1  mm     G. Age:  20w 2d         91  %    HC/AC:      1.18       1.09 -
AC:      151.1  mm     G. Age:  20w 2d         85  %    FL/BPD:     65.0   %
FL:       31.2  mm     G. Age:  19w 5d         68  %    FL/AC:      20.6   %   20 - 24

Est. FW:     331  gm    0 lb 12 oz      61  %
Gestational Age

LMP:           19w 0d       Date:   05/29/16                 EDD:   03/05/17
U/S Today:     20w 2d                                        EDD:   02/24/17
Best:          19w 0d    Det. By:   LMP  (05/29/16)          EDD:   03/05/17
Anatomy

Cranium:               Appears normal         Aortic Arch:            Appears normal
Cavum:                 Appears normal         Ductal Arch:            Not well visualized
Ventricles:            Appears normal         Diaphragm:              Appears normal
Choroid Plexus:        Appears normal         Stomach:                Appears normal, left
sided
Cerebellum:            Appears normal         Abdomen:                Appears normal
Posterior Fossa:       Appears normal         Abdominal Wall:         Appears nml (cord
insert, abd wall)
Nuchal Fold:           Appears normal         Cord Vessels:           Appears normal (3
vessel cord)
Face:                  Appears normal         Kidneys:                Appear normal
(orbits and profile)
Lips:                  Appears normal         Bladder:                Appears normal
Thoracic:              Appears normal         Spine:                  Appears normal
Heart:                 Appears normal         Upper Extremities:      Appears normal
(4CH, axis, and situs
RVOT:                  Appears normal         Lower Extremities:      Appears normal
LVOT:                  Appears normal

Other:  Fetus appears to be a male. Nasal bone visualized. Technically
difficult due to maternal habitus and fetal position.
Cervix Uterus Adnexa

Cervix
Length:           3.21  cm.
Normal appearance by transabdominal scan.

Uterus
No abnormality visualized.

Left Ovary
Not visualized.

Right Ovary
Within normal limits.

Adnexa:       No abnormality visualized.
Impression

SIUP at 19+0 weeks
Normal detailed fetal anatomy; limited views of the DA
Markers of aneuploidy: none
Normal amniotic fluid volume
Measurements consistent with LMP dating
Recommendations

Follow-up as clinically indicated

## 2017-09-02 ENCOUNTER — Other Ambulatory Visit: Payer: Self-pay | Admitting: Orthopedic Surgery

## 2017-09-02 DIAGNOSIS — M542 Cervicalgia: Secondary | ICD-10-CM

## 2017-09-05 ENCOUNTER — Other Ambulatory Visit: Payer: Self-pay

## 2017-09-13 ENCOUNTER — Other Ambulatory Visit: Payer: Self-pay

## 2017-09-15 ENCOUNTER — Ambulatory Visit
Admission: RE | Admit: 2017-09-15 | Discharge: 2017-09-15 | Disposition: A | Discharge status: Home / Self Care | Payer: Medicaid Other | Source: Ambulatory Visit | Attending: Orthopedic Surgery | Admitting: Orthopedic Surgery

## 2017-09-15 DIAGNOSIS — M542 Cervicalgia: Secondary | ICD-10-CM

## 2018-09-28 NOTE — L&D Delivery Note (Signed)
Delivery Note At 6:17 PM a viable and healthy female was delivered via Vaginal, Spontaneous (Presentation: ROA).  APGAR: 9, 9; weight 8 lb 1.5 oz (3670 g).   Placenta status: spontaneous, intact.  Cord: 3 vessel with the following complications: none.    Anesthesia:  IV Fentanyl Episiotomy: None Lacerations: Perineal;2nd degree Suture Repair: 3.0 vicryl rapide Est. Blood Loss (mL): 493  Mom to postpartum.  Baby to Couplet care / Skin to Skin.  Brittany Fritz is a 33 y.o. female [phone] with IUP at [redacted]w[redacted]d admitted for early labor and vaginal bleeding.  She progressed with AROM only for augmentation to complete and pushed less than 5 minutes to deliver.  Cord clamping delayed by 3 minutes then clamped by CNM and cut by FOB.  Placenta intact and spontaneous, bleeding minimal.  First degree perineal laceration repaired without difficulty.  Mom and baby stable prior to transfer to postpartum. She plans on breastfeeding.    Lattie Haw Leftwich-Kirby 07/27/2019, 8:20 PM

## 2019-03-03 ENCOUNTER — Other Ambulatory Visit: Payer: Self-pay

## 2019-03-03 ENCOUNTER — Ambulatory Visit (INDEPENDENT_AMBULATORY_CARE_PROVIDER_SITE_OTHER): Payer: BLUE CROSS/BLUE SHIELD

## 2019-03-03 VITALS — BP 95/68 | HR 109 | Wt 210.0 lb

## 2019-03-03 DIAGNOSIS — Z3480 Encounter for supervision of other normal pregnancy, unspecified trimester: Secondary | ICD-10-CM | POA: Insufficient documentation

## 2019-03-03 DIAGNOSIS — Z3201 Encounter for pregnancy test, result positive: Secondary | ICD-10-CM | POA: Diagnosis not present

## 2019-03-03 LAB — POCT URINE PREGNANCY: Preg Test, Ur: POSITIVE — AB

## 2019-03-03 NOTE — Progress Notes (Addendum)
Ms. Rutt presents today for UPT. She has no unusual complaints.  LMP: 12/17/2018    OBJECTIVE: Appears well, in no apparent distress.  OB History    Gravida  4   Para  2   Term  2   Preterm      AB  1   Living  2     SAB  1   TAB      Ectopic      Multiple  0   Live Births  2          Home UPT Result: POSITIVE x2 In-Office UPT result: POSITIVE  I have reviewed the patient's medical, obstetrical, social, and family histories, and medications.   ASSESSMENT: Positive pregnancy test  PLAN Prenatal care to be completed at: [name]   I have reviewed the chart and agree with nursing staff's documentation of this patient's encounter.  Coral Ceo, MD 03/03/2019 11:30 AM

## 2019-03-07 ENCOUNTER — Other Ambulatory Visit: Payer: Self-pay

## 2019-03-07 ENCOUNTER — Ambulatory Visit: Payer: BLUE CROSS/BLUE SHIELD

## 2019-03-07 DIAGNOSIS — Z3480 Encounter for supervision of other normal pregnancy, unspecified trimester: Secondary | ICD-10-CM

## 2019-03-07 NOTE — Progress Notes (Signed)
Pt is on the phone with interpreter for new OB nurse intake. LMP 12/17/18. EDD 09/23/2019. Health history intake reviewed, problem list updated. Confirmed pt new OB appt on 03/20/19, pt verbalizes understanding and has no questions or concerns at this time.

## 2019-03-20 ENCOUNTER — Other Ambulatory Visit: Payer: Self-pay

## 2019-03-20 ENCOUNTER — Encounter: Payer: Self-pay | Admitting: Obstetrics and Gynecology

## 2019-03-20 ENCOUNTER — Other Ambulatory Visit (HOSPITAL_COMMUNITY)
Admission: RE | Admit: 2019-03-20 | Discharge: 2019-03-20 | Disposition: A | Discharge status: Home / Self Care | Payer: Medicaid Other | Source: Ambulatory Visit | Attending: Obstetrics and Gynecology | Admitting: Obstetrics and Gynecology

## 2019-03-20 ENCOUNTER — Ambulatory Visit (INDEPENDENT_AMBULATORY_CARE_PROVIDER_SITE_OTHER): Payer: Medicaid Other | Admitting: Obstetrics and Gynecology

## 2019-03-20 VITALS — BP 106/73 | HR 91 | Wt 215.1 lb

## 2019-03-20 DIAGNOSIS — O09299 Supervision of pregnancy with other poor reproductive or obstetric history, unspecified trimester: Secondary | ICD-10-CM

## 2019-03-20 DIAGNOSIS — Z3480 Encounter for supervision of other normal pregnancy, unspecified trimester: Secondary | ICD-10-CM | POA: Diagnosis present

## 2019-03-20 DIAGNOSIS — Z8632 Personal history of gestational diabetes: Secondary | ICD-10-CM

## 2019-03-20 DIAGNOSIS — Z3A13 13 weeks gestation of pregnancy: Secondary | ICD-10-CM

## 2019-03-20 DIAGNOSIS — O99210 Obesity complicating pregnancy, unspecified trimester: Secondary | ICD-10-CM | POA: Insufficient documentation

## 2019-03-20 MED ORDER — BLOOD PRESSURE MONITOR/L CUFF MISC: 1.0000 | Freq: Once | 0 refills | Status: AC

## 2019-03-20 MED ORDER — VITAFOL ULTRA 29-0.6-0.4-200 MG PO CAPS: 1.0000 | ORAL_CAPSULE | Freq: Every day | ORAL | 12 refills | Status: DC

## 2019-03-20 MED ORDER — ASPIRIN EC 81 MG PO TBEC: 81.0000 mg | DELAYED_RELEASE_TABLET | Freq: Every day | ORAL | 2 refills | Status: DC

## 2019-03-20 NOTE — Patient Instructions (Signed)
° °Second Trimester of Pregnancy °The second trimester is from week 14 through week 27 (months 4 through 6). The second trimester is often a time when you feel your best. Your body has adjusted to being pregnant, and you begin to feel better physically. Usually, morning sickness has lessened or quit completely, you may have more energy, and you may have an increase in appetite. The second trimester is also a time when the fetus is growing rapidly. At the end of the sixth month, the fetus is about 9 inches long and weighs about 1½ pounds. You will likely begin to feel the baby move (quickening) between 16 and 20 weeks of pregnancy. °Body changes during your second trimester °Your body continues to go through many changes during your second trimester. The changes vary from woman to woman. °· Your weight will continue to increase. You will notice your lower abdomen bulging out. °· You may begin to get stretch marks on your hips, abdomen, and breasts. °· You may develop headaches that can be relieved by medicines. The medicines should be approved by your health care provider. °· You may urinate more often because the fetus is pressing on your bladder. °· You may develop or continue to have heartburn as a result of your pregnancy. °· You may develop constipation because certain hormones are causing the muscles that push waste through your intestines to slow down. °· You may develop hemorrhoids or swollen, bulging veins (varicose veins). °· You may have back pain. This is caused by: °? Weight gain. °? Pregnancy hormones that are relaxing the joints in your pelvis. °? A shift in weight and the muscles that support your balance. °· Your breasts will continue to grow and they will continue to become tender. °· Your gums may bleed and may be sensitive to brushing and flossing. °· Dark spots or blotches (chloasma, mask of pregnancy) may develop on your face. This will likely fade after the baby is born. °· A dark line from  your belly button to the pubic area (linea nigra) may appear. This will likely fade after the baby is born. °· You may have changes in your hair. These can include thickening of your hair, rapid growth, and changes in texture. Some women also have hair loss during or after pregnancy, or hair that feels dry or thin. Your hair will most likely return to normal after your baby is born. °What to expect at prenatal visits °During a routine prenatal visit: °· You will be weighed to make sure you and the fetus are growing normally. °· Your blood pressure will be taken. °· Your abdomen will be measured to track your baby's growth. °· The fetal heartbeat will be listened to. °· Any test results from the previous visit will be discussed. °Your health care provider may ask you: °· How you are feeling. °· If you are feeling the baby move. °· If you have had any abnormal symptoms, such as leaking fluid, bleeding, severe headaches, or abdominal cramping. °· If you are using any tobacco products, including cigarettes, chewing tobacco, and electronic cigarettes. °· If you have any questions. °Other tests that may be performed during your second trimester include: °· Blood tests that check for: °? Low iron levels (anemia). °? High blood sugar that affects pregnant women (gestational diabetes) between 24 and 28 weeks. °? Rh antibodies. This is to check for a protein on red blood cells (Rh factor). °· Urine tests to check for infections, diabetes, or protein in   the urine. °· An ultrasound to confirm the proper growth and development of the baby. °· An amniocentesis to check for possible genetic problems. °· Fetal screens for spina bifida and Down syndrome. °· HIV (human immunodeficiency virus) testing. Routine prenatal testing includes screening for HIV, unless you choose not to have this test. °Follow these instructions at home: °Medicines °· Follow your health care provider's instructions regarding medicine use. Specific medicines  may be either safe or unsafe to take during pregnancy. °· Take a prenatal vitamin that contains at least 600 micrograms (mcg) of folic acid. °· If you develop constipation, try taking a stool softener if your health care provider approves. °Eating and drinking ° °· Eat a balanced diet that includes fresh fruits and vegetables, whole grains, good sources of protein such as meat, eggs, or tofu, and low-fat dairy. Your health care provider will help you determine the amount of weight gain that is right for you. °· Avoid raw meat and uncooked cheese. These carry germs that can cause birth defects in the baby. °· If you have low calcium intake from food, talk to your health care provider about whether you should take a daily calcium supplement. °· Limit foods that are high in fat and processed sugars, such as fried and sweet foods. °· To prevent constipation: °? Drink enough fluid to keep your urine clear or pale yellow. °? Eat foods that are high in fiber, such as fresh fruits and vegetables, whole grains, and beans. °Activity °· Exercise only as directed by your health care provider. Most women can continue their usual exercise routine during pregnancy. Try to exercise for 30 minutes at least 5 days a week. Stop exercising if you experience uterine contractions. °· Avoid heavy lifting, wear low heel shoes, and practice good posture. °· A sexual relationship may be continued unless your health care provider directs you otherwise. °Relieving pain and discomfort °· Wear a good support bra to prevent discomfort from breast tenderness. °· Take warm sitz baths to soothe any pain or discomfort caused by hemorrhoids. Use hemorrhoid cream if your health care provider approves. °· Rest with your legs elevated if you have leg cramps or low back pain. °· If you develop varicose veins, wear support hose. Elevate your feet for 15 minutes, 3-4 times a day. Limit salt in your diet. °Prenatal Care °· Write down your questions. Take  them to your prenatal visits. °· Keep all your prenatal visits as told by your health care provider. This is important. °Safety °· Wear your seat belt at all times when driving. °· Make a list of emergency phone numbers, including numbers for family, friends, the hospital, and police and fire departments. °General instructions °· Ask your health care provider for a referral to a local prenatal education class. Begin classes no later than the beginning of month 6 of your pregnancy. °· Ask for help if you have counseling or nutritional needs during pregnancy. Your health care provider can offer advice or refer you to specialists for help with various needs. °· Do not use hot tubs, steam rooms, or saunas. °· Do not douche or use tampons or scented sanitary pads. °· Do not cross your legs for long periods of time. °· Avoid cat litter boxes and soil used by cats. These carry germs that can cause birth defects in the baby and possibly loss of the fetus by miscarriage or stillbirth. °· Avoid all smoking, herbs, alcohol, and unprescribed drugs. Chemicals in these products can affect the   formation and growth of the baby. °· Do not use any products that contain nicotine or tobacco, such as cigarettes and e-cigarettes. If you need help quitting, ask your health care provider. °· Visit your dentist if you have not gone yet during your pregnancy. Use a soft toothbrush to brush your teeth and be gentle when you floss. °Contact a health care provider if: °· You have dizziness. °· You have mild pelvic cramps, pelvic pressure, or nagging pain in the abdominal area. °· You have persistent nausea, vomiting, or diarrhea. °· You have a bad smelling vaginal discharge. °· You have pain when you urinate. °Get help right away if: °· You have a fever. °· You are leaking fluid from your vagina. °· You have spotting or bleeding from your vagina. °· You have severe abdominal cramping or pain. °· You have rapid weight gain or weight loss. °· You  have shortness of breath with chest pain. °· You notice sudden or extreme swelling of your face, hands, ankles, feet, or legs. °· You have not felt your baby move in over an hour. °· You have severe headaches that do not go away when you take medicine. °· You have vision changes. °Summary °· The second trimester is from week 14 through week 27 (months 4 through 6). It is also a time when the fetus is growing rapidly. °· Your body goes through many changes during pregnancy. The changes vary from woman to woman. °· Avoid all smoking, herbs, alcohol, and unprescribed drugs. These chemicals affect the formation and growth your baby. °· Do not use any tobacco products, such as cigarettes, chewing tobacco, and e-cigarettes. If you need help quitting, ask your health care provider. °· Contact your health care provider if you have any questions. Keep all prenatal visits as told by your health care provider. This is important. °This information is not intended to replace advice given to you by your health care provider. Make sure you discuss any questions you have with your health care provider. °Document Released: 09/08/2001 Document Revised: 10/20/2016 Document Reviewed: 10/20/2016 °[employer] Interactive Patient Education © 2019 [employer] ° ° °Contraception Choices °Contraception, also called birth control, refers to methods or devices that prevent pregnancy. °Hormonal methods °Contraceptive implant ° °A contraceptive implant is a thin, plastic tube that contains a hormone. It is inserted into the upper part of the arm. It can remain in place for up to 3 years. °Progestin-only injections °Progestin-only injections are injections of progestin, a synthetic form of the hormone progesterone. They are given every 3 months by a health care provider. °Birth control pills ° °Birth control pills are pills that contain hormones that prevent pregnancy. They must be taken once a day, preferably at the same time each day. °Birth  control patch ° °The birth control patch contains hormones that prevent pregnancy. It is placed on the skin and must be changed once a week for three weeks and removed on the fourth week. A prescription is needed to use this method of contraception. °Vaginal ring ° °A vaginal ring contains hormones that prevent pregnancy. It is placed in the vagina for three weeks and removed on the fourth week. After that, the process is repeated with a new ring. A prescription is needed to use this method of contraception. °Emergency contraceptive °Emergency contraceptives prevent pregnancy after unprotected sex. They come in pill form and can be taken up to 5 days after sex. They work best the sooner they are taken after having sex. Most   emergency contraceptives are available without a prescription. This method should not be used as your only form of birth control. °Barrier methods °Female condom ° °A female condom is a thin sheath that is worn over the penis during sex. Condoms keep sperm from going inside a woman's body. They can be used with a spermicide to increase their effectiveness. They should be disposed after a single use. °Female condom ° °A female condom is a soft, loose-fitting sheath that is put into the vagina before sex. The condom keeps sperm from going inside a woman's body. They should be disposed after a single use. °Diaphragm ° °A diaphragm is a soft, dome-shaped barrier. It is inserted into the vagina before sex, along with a spermicide. The diaphragm blocks sperm from entering the uterus, and the spermicide kills sperm. A diaphragm should be left in the vagina for 6-8 hours after sex and removed within 24 hours. °A diaphragm is prescribed and fitted by a health care provider. A diaphragm should be replaced every 1-2 years, after giving birth, after gaining more than 15 lb (6.8 kg), and after pelvic surgery. °Cervical cap ° °A cervical cap is a round, soft latex or plastic cup that fits over the cervix. It is  inserted into the vagina before sex, along with spermicide. It blocks sperm from entering the uterus. The cap should be left in place for 6-8 hours after sex and removed within 48 hours. A cervical cap must be prescribed and fitted by a health care provider. It should be replaced every 2 years. °Sponge ° °A sponge is a soft, circular piece of polyurethane foam with spermicide on it. The sponge helps block sperm from entering the uterus, and the spermicide kills sperm. To use it, you make it wet and then insert it into the vagina. It should be inserted before sex, left in for at least 6 hours after sex, and removed and thrown away within 30 hours. °Spermicides °Spermicides are chemicals that kill or block sperm from entering the cervix and uterus. They can come as a cream, jelly, suppository, foam, or tablet. A spermicide should be inserted into the vagina with an applicator at least 10-15 minutes before sex to allow time for it to work. The process must be repeated every time you have sex. Spermicides do not require a prescription. °Intrauterine contraception °Intrauterine device (IUD) °An IUD is a T-shaped device that is put in a woman's uterus. There are two types: °· Hormone IUD.This type contains progestin, a synthetic form of the hormone progesterone. This type can stay in place for 3-5 years. °· Copper IUD.This type is wrapped in copper wire. It can stay in place for 10 years. ° °Permanent methods of contraception °Female tubal ligation °In this method, a woman's fallopian tubes are sealed, tied, or blocked during surgery to prevent eggs from traveling to the uterus. °Hysteroscopic sterilization °In this method, a small, flexible insert is placed into each fallopian tube. The inserts cause scar tissue to form in the fallopian tubes and block them, so sperm cannot reach an egg. The procedure takes about 3 months to be effective. Another form of birth control must be used during those 3 months. °Female  sterilization °This is a procedure to tie off the tubes that carry sperm (vasectomy). After the procedure, the man can still ejaculate fluid (semen). °Natural planning methods °Natural family planning °In this method, a couple does not have sex on days when the woman could become pregnant. °Calendar method °This means keeping   track of the length of each menstrual cycle, identifying the days when pregnancy can happen, and not having sex on those days. °Ovulation method °In this method, a couple avoids sex during ovulation. °Symptothermal method °This method involves not having sex during ovulation. The woman typically checks for ovulation by watching changes in her temperature and in the consistency of cervical mucus. °Post-ovulation method °In this method, a couple waits to have sex until after ovulation. °Summary °· Contraception, also called birth control, means methods or devices that prevent pregnancy. °· Hormonal methods of contraception include implants, injections, pills, patches, vaginal rings, and emergency contraceptives. °· Barrier methods of contraception can include female condoms, female condoms, diaphragms, cervical caps, sponges, and spermicides. °· There are two types of IUDs (intrauterine devices). An IUD can be put in a woman's uterus to prevent pregnancy for 3-5 years. °· Permanent sterilization can be done through a procedure for males, females, or both. °· Natural family planning methods involve not having sex on days when the woman could become pregnant. °This information is not intended to replace advice given to you by your health care provider. Make sure you discuss any questions you have with your health care provider. °Document Released: 09/14/2005 Document Revised: 09/16/2017 Document Reviewed: 10/17/2016 °[employer] Interactive Patient Education © 2019 [employer] ° ° °Breastfeeding ° °Choosing to breastfeed is one of the best decisions you can make for yourself and your baby. A change in  hormones during pregnancy causes your breasts to make breast milk in your milk-producing glands. Hormones prevent breast milk from being released before your baby is born. They also prompt milk flow after birth. Once breastfeeding has begun, thoughts of your baby, as well as his or her sucking or crying, can stimulate the release of milk from your milk-producing glands. °Benefits of breastfeeding °Research shows that breastfeeding offers many health benefits for infants and mothers. It also offers a cost-free and convenient way to feed your baby. °For your baby °· Your first milk (colostrum) helps your baby's digestive system to function better. °· Special cells in your milk (antibodies) help your baby to fight off infections. °· Breastfed babies are less likely to develop asthma, allergies, obesity, or type 2 diabetes. They are also at lower risk for sudden infant death syndrome (SIDS). °· Nutrients in breast milk are better able to meet your baby’s needs compared to infant formula. °· Breast milk improves your baby's brain development. °For you °· Breastfeeding helps to create a very special bond between you and your baby. °· Breastfeeding is convenient. Breast milk costs nothing and is always available at the correct temperature. °· Breastfeeding helps to burn calories. It helps you to lose the weight that you gained during pregnancy. °· Breastfeeding makes your uterus return faster to its size before pregnancy. It also slows bleeding (lochia) after you give birth. °· Breastfeeding helps to lower your risk of developing type 2 diabetes, osteoporosis, rheumatoid arthritis, cardiovascular disease, and breast, ovarian, uterine, and endometrial cancer later in life. °Breastfeeding basics °Starting breastfeeding °· Find a comfortable place to sit or lie down, with your neck and back well-supported. °· Place a pillow or a rolled-up blanket under your baby to bring him or her to the level of your breast (if you are  seated). Nursing pillows are specially designed to help support your arms and your baby while you breastfeed. °· Make sure that your baby's tummy (abdomen) is facing your abdomen. °· Gently massage your breast. With your fingertips, massage from   the outer edges of your breast inward toward the nipple. This encourages milk flow. If your milk flows slowly, you may need to continue this action during the feeding. °· Support your breast with 4 fingers underneath and your thumb above your nipple (make the letter "C" with your hand). Make sure your fingers are well away from your nipple and your baby’s mouth. °· Stroke your baby's lips gently with your finger or nipple. °· When your baby's mouth is open wide enough, quickly bring your baby to your breast, placing your entire nipple and as much of the areola as possible into your baby's mouth. The areola is the colored area around your nipple. °? More areola should be visible above your baby's upper lip than below the lower lip. °? Your baby's lips should be opened and extended outward (flanged) to ensure an adequate, comfortable latch. °? Your baby's tongue should be between his or her lower gum and your breast. °· Make sure that your baby's mouth is correctly positioned around your nipple (latched). Your baby's lips should create a seal on your breast and be turned out (everted). °· It is common for your baby to suck about 2-3 minutes in order to start the flow of breast milk. °Latching °Teaching your baby how to latch onto your breast properly is very important. An improper latch can cause nipple pain, decreased milk supply, and poor weight gain in your baby. Also, if your baby is not latched onto your nipple properly, he or she may swallow some air during feeding. This can make your baby fussy. Burping your baby when you switch breasts during the feeding can help to get rid of the air. However, teaching your baby to latch on properly is still the best way to prevent  fussiness from swallowing air while breastfeeding. °Signs that your baby has successfully latched onto your nipple °· Silent tugging or silent sucking, without causing you pain. Infant's lips should be extended outward (flanged). °· Swallowing heard between every 3-4 sucks once your milk has started to flow (after your let-down milk reflex occurs). °· Muscle movement above and in front of his or her ears while sucking. °Signs that your baby has not successfully latched onto your nipple °· Sucking sounds or smacking sounds from your baby while breastfeeding. °· Nipple pain. °If you think your baby has not latched on correctly, slip your finger into the corner of your baby’s mouth to break the suction and place it between your baby's gums. Attempt to start breastfeeding again. °Signs of successful breastfeeding °Signs from your baby °· Your baby will gradually decrease the number of sucks or will completely stop sucking. °· Your baby will fall asleep. °· Your baby's body will relax. °· Your baby will retain a small amount of milk in his or her mouth. °· Your baby will let go of your breast by himself or herself. °Signs from you °· Breasts that have increased in firmness, weight, and size 1-3 hours after feeding. °· Breasts that are softer immediately after breastfeeding. °· Increased milk volume, as well as a change in milk consistency and color by the fifth day of breastfeeding. °· Nipples that are not sore, cracked, or bleeding. °Signs that your baby is getting enough milk °· Wetting at least 1-2 diapers during the first 24 hours after birth. °· Wetting at least 5-6 diapers every 24 hours for the first week after birth. The urine should be clear or pale yellow by the age of 5 days. °·   Wetting 6-8 diapers every 24 hours as your baby continues to grow and develop. °· At least 3 stools in a 24-hour period by the age of 5 days. The stool should be soft and yellow. °· At least 3 stools in a 24-hour period by the age of 7  days. The stool should be seedy and yellow. °· No loss of weight greater than 10% of birth weight during the first 3 days of life. °· Average weight gain of 4-7 oz (113-198 g) per week after the age of 4 days. °· Consistent daily weight gain by the age of 5 days, without weight loss after the age of 2 weeks. °After a feeding, your baby may spit up a small amount of milk. This is normal. °Breastfeeding frequency and duration °Frequent feeding will help you make more milk and can prevent sore nipples and extremely full breasts (breast engorgement). Breastfeed when you feel the need to reduce the fullness of your breasts or when your baby shows signs of hunger. This is called "breastfeeding on demand." Signs that your baby is hungry include: °· Increased alertness, activity, or restlessness. °· Movement of the head from side to side. °· Opening of the mouth when the corner of the mouth or cheek is stroked (rooting). °· Increased sucking sounds, smacking lips, cooing, sighing, or squeaking. °· Hand-to-mouth movements and sucking on fingers or hands. °· Fussing or crying. °Avoid introducing a pacifier to your baby in the first 4-6 weeks after your baby is born. After this time, you may choose to use a pacifier. Research has shown that pacifier use during the first year of a baby's life decreases the risk of sudden infant death syndrome (SIDS). °Allow your baby to feed on each breast as long as he or she wants. When your baby unlatches or falls asleep while feeding from the first breast, offer the second breast. Because newborns are often sleepy in the first few weeks of life, you may need to awaken your baby to get him or her to feed. °Breastfeeding times will vary from baby to baby. However, the following rules can serve as a guide to help you make sure that your baby is properly fed: °· Newborns (babies 4 weeks of age or younger) may breastfeed every 1-3 hours. °· Newborns should not go without breastfeeding for longer  than 3 hours during the day or 5 hours during the night. °· You should breastfeed your baby a minimum of 8 times in a 24-hour period. °Breast milk pumping ° °  ° °Pumping and storing breast milk allows you to make sure that your baby is exclusively fed your breast milk, even at times when you are unable to breastfeed. This is especially important if you go back to work while you are still breastfeeding, or if you are not able to be present during feedings. Your lactation consultant can help you find a method of pumping that works best for you and give you guidelines about how long it is safe to store breast milk. °Caring for your breasts while you breastfeed °Nipples can become dry, cracked, and sore while breastfeeding. The following recommendations can help keep your breasts moisturized and healthy: °· Avoid using soap on your nipples. °· Wear a supportive bra designed especially for nursing. Avoid wearing underwire-style bras or extremely tight bras (sports bras). °· Air-dry your nipples for 3-4 minutes after each feeding. °· Use only cotton bra pads to absorb leaked breast milk. Leaking of breast milk between feedings is   normal. °· Use lanolin on your nipples after breastfeeding. Lanolin helps to maintain your skin's normal moisture barrier. Pure lanolin is not harmful (not toxic) to your baby. You may also hand express a few drops of breast milk and gently massage that milk into your nipples and allow the milk to air-dry. °In the first few weeks after giving birth, some women experience breast engorgement. Engorgement can make your breasts feel heavy, warm, and tender to the touch. Engorgement peaks within 3-5 days after you give birth. The following recommendations can help to ease engorgement: °· Completely empty your breasts while breastfeeding or pumping. You may want to start by applying warm, moist heat (in the shower or with warm, water-soaked hand towels) just before feeding or pumping. This increases  circulation and helps the milk flow. If your baby does not completely empty your breasts while breastfeeding, pump any extra milk after he or she is finished. °· Apply ice packs to your breasts immediately after breastfeeding or pumping, unless this is too uncomfortable for you. To do this: °? Put ice in a plastic bag. °? Place a towel between your skin and the bag. °? Leave the ice on for 20 minutes, 2-3 times a day. °· Make sure that your baby is latched on and positioned properly while breastfeeding. °If engorgement persists after 48 hours of following these recommendations, contact your health care provider or a lactation consultant. °Overall health care recommendations while breastfeeding °· Eat 3 healthy meals and 3 snacks every day. Well-nourished mothers who are breastfeeding need an additional 450-500 calories a day. You can meet this requirement by increasing the amount of a balanced diet that you eat. °· Drink enough water to keep your urine pale yellow or clear. °· Rest often, relax, and continue to take your prenatal vitamins to prevent fatigue, stress, and low vitamin and mineral levels in your body (nutrient deficiencies). °· Do not use any products that contain nicotine or tobacco, such as cigarettes and e-cigarettes. Your baby may be harmed by chemicals from cigarettes that pass into breast milk and exposure to secondhand smoke. If you need help quitting, ask your health care provider. °· Avoid alcohol. °· Do not use illegal drugs or marijuana. °· Talk with your health care provider before taking any medicines. These include over-the-counter and prescription medicines as well as vitamins and herbal supplements. Some medicines that may be harmful to your baby can pass through breast milk. °· It is possible to become pregnant while breastfeeding. If birth control is desired, ask your health care provider about options that will be safe while breastfeeding your baby. °Where to find more  information: °[employer]: www.llli.org °Contact a health care provider if: °· You feel like you want to stop breastfeeding or have become frustrated with breastfeeding. °· Your nipples are cracked or bleeding. °· Your breasts are red, tender, or warm. °· You have: °? Painful breasts or nipples. °? A swollen area on either breast. °? A fever or chills. °? Nausea or vomiting. °? Drainage other than breast milk from your nipples. °· Your breasts do not become full before feedings by the fifth day after you give birth. °· You feel sad and depressed. °· Your baby is: °? Too sleepy to eat well. °? Having trouble sleeping. °? More than 1 week old and wetting fewer than 6 diapers in a 24-hour period. °? Not gaining weight by 5 days of age. °· Your baby has fewer than 3 stools in a   24-hour period. °· Your baby's skin or the white parts of his or her eyes become yellow. °Get help right away if: °· Your baby is overly tired (lethargic) and does not want to wake up and feed. °· Your baby develops an unexplained fever. °Summary °· Breastfeeding offers many health benefits for infant and mothers. °· Try to breastfeed your infant when he or she shows early signs of hunger. °· Gently tickle or stroke your baby's lips with your finger or nipple to allow the baby to open his or her mouth. Bring the baby to your breast. Make sure that much of the areola is in your baby's mouth. Offer one side and burp the baby before you offer the other side. °· Talk with your health care provider or lactation consultant if you have questions or you face problems as you breastfeed. °This information is not intended to replace advice given to you by your health care provider. Make sure you discuss any questions you have with your health care provider. °Document Released: 09/14/2005 Document Revised: 10/16/2016 Document Reviewed: 10/16/2016 °Elsevier Interactive Patient Education © 2019 [employer]vier Inc. ° °

## 2019-03-20 NOTE — Progress Notes (Signed)
Pt is here for initial OB visit. She believes her LMP is 12/17/18 but is unsure.

## 2019-03-20 NOTE — Progress Notes (Signed)
  Subjective:    Brittany Fritz is a [id number] [redacted]w[redacted]d being seen today for her first obstetrical visit.  Her obstetrical history is significant for history of GDMA1. Patient does intend to breast feed. Pregnancy history fully reviewed.  Patient reports no complaints.  Vitals:   03/20/19 0826  BP: 106/73  Pulse: 91  Weight: 215 lb 1.6 oz (97.6 kg)    HISTORY: OB History  Gravida Para Term Preterm AB Living  4 2 2   1 2   SAB TAB Ectopic Multiple Live Births  1     0 2    # Outcome Date GA Lbr Len/2nd Weight Sex Delivery Anes PTL Lv  4 Current           3 Term 03/01/17 [redacted]w[redacted]d 08:47 / 00:04 9 lb 2.7 oz (4.16 kg) M Vag-Spont Local  LIV  2 Term 07/25/13   7 lb 15 oz (3.6 kg) M Vag-Spont   LIV  1 SAB            Past Medical History:  Diagnosis Date  . Allergy    Seasonal  . Medical history non-contributory    Past Surgical History:  Procedure Laterality Date  . NO PAST SURGERIES     Family History  Problem Relation Age of Onset  . Heart disease Father   . Stroke Father      Exam    Uterus:     Pelvic Exam:    Perineum: Normal Perineum   Vulva: normal   Vagina:  normal mucosa, normal discharge   pH:    Cervix: multiparous appearance   Adnexa: no mass, fullness, tenderness   Bony Pelvis: gynecoid  System: Breast:  normal appearance, no masses or tenderness   Skin: normal coloration and turgor, no rashes    Neurologic: oriented, no focal deficits   Extremities: normal strength, tone, and muscle mass   HEENT extra ocular movement intact   Mouth/Teeth mucous membranes moist, pharynx normal without lesions and dental hygiene good   Neck supple and no masses   Cardiovascular: regular rate and rhythm   Respiratory:  appears well, vitals normal, no respiratory distress, acyanotic, normal RR, chest clear, no wheezing, crepitations, rhonchi, normal symmetric air entry   Abdomen: soft, non-tender; bowel sounds normal; no masses,  no organomegaly   Urinary:        Assessment:    Pregnancy: [id number] Patient Active Problem List   Diagnosis Date Noted  . Maternal obesity, antepartum 03/20/2019  . Supervision of other normal pregnancy, antepartum 03/03/2019  . History of gestational diabetes mellitus (GDM) in prior pregnancy, currently pregnant 12/17/2016  . Immigrant with language difficulty 01/23/2016        Plan:     Initial labs drawn. Prenatal vitamins. Problem list reviewed and updated. Genetic Screening discussed : panorama ordered.  Ultrasound discussed; fetal survey: ordered. A1c ordered given history of GDM Rx ASA provided  Follow up in 4 weeks via [place] 50% of 30 min visit spent on counseling and coordination of care.     Tan Clopper 03/20/2019

## 2019-03-21 LAB — URINE CYTOLOGY ANCILLARY ONLY
Chlamydia: NEGATIVE
Neisseria Gonorrhea: NEGATIVE

## 2019-03-22 LAB — OBSTETRIC PANEL, INCLUDING HIV
Antibody Screen: NEGATIVE
Basophils Absolute: 0 10*3/uL (ref 0.0–0.2)
Basos: 0 %
EOS (ABSOLUTE): 0.3 10*3/uL (ref 0.0–0.4)
Eos: 3 %
HIV Screen 4th Generation wRfx: NONREACTIVE
Hematocrit: 37.9 % (ref 34.0–46.6)
Hemoglobin: 12.8 g/dL (ref 11.1–15.9)
Hepatitis B Surface Ag: NEGATIVE
Immature Grans (Abs): 0 10*3/uL (ref 0.0–0.1)
Immature Granulocytes: 0 %
Lymphocytes Absolute: 1.4 10*3/uL (ref 0.7–3.1)
Lymphs: 16 %
MCH: 27.8 pg (ref 26.6–33.0)
MCHC: 33.8 g/dL (ref 31.5–35.7)
MCV: 82 fL (ref 79–97)
Monocytes Absolute: 0.4 10*3/uL (ref 0.1–0.9)
Monocytes: 5 %
Neutrophils Absolute: 7 10*3/uL (ref 1.4–7.0)
Neutrophils: 76 %
Platelets: 177 10*3/uL (ref 150–450)
RBC: 4.6 x10E6/uL (ref 3.77–5.28)
RDW: 14.1 % (ref 11.7–15.4)
RPR Ser Ql: NONREACTIVE
Rh Factor: POSITIVE
Rubella Antibodies, IGG: 9.12 index (ref >0.99)
WBC: 9.2 10*3/uL (ref 3.4–10.8)

## 2019-03-22 LAB — HEMOGLOBIN A1C
Est. average glucose Bld gHb Est-mCnc: 108 mg/dL
Hgb A1c MFr Bld: 5.4 % (ref 4.8–5.6)

## 2019-03-24 LAB — CULTURE, OB URINE

## 2019-03-24 LAB — URINE CULTURE, OB REFLEX

## 2019-03-27 ENCOUNTER — Encounter: Payer: Self-pay | Admitting: Obstetrics and Gynecology

## 2019-03-27 DIAGNOSIS — R8271 Bacteriuria: Secondary | ICD-10-CM | POA: Insufficient documentation

## 2019-04-17 ENCOUNTER — Encounter: Payer: Self-pay | Admitting: Obstetrics and Gynecology

## 2019-04-17 ENCOUNTER — Ambulatory Visit (INDEPENDENT_AMBULATORY_CARE_PROVIDER_SITE_OTHER): Payer: Medicaid Other | Admitting: Obstetrics and Gynecology

## 2019-04-17 ENCOUNTER — Other Ambulatory Visit: Payer: Self-pay

## 2019-04-17 VITALS — BP 99/65 | HR 101 | Temp 97.2°F | Wt 218.4 lb

## 2019-04-17 DIAGNOSIS — Z3480 Encounter for supervision of other normal pregnancy, unspecified trimester: Secondary | ICD-10-CM

## 2019-04-17 DIAGNOSIS — O09299 Supervision of pregnancy with other poor reproductive or obstetric history, unspecified trimester: Secondary | ICD-10-CM

## 2019-04-17 DIAGNOSIS — R8271 Bacteriuria: Secondary | ICD-10-CM

## 2019-04-17 DIAGNOSIS — Z3A17 17 weeks gestation of pregnancy: Secondary | ICD-10-CM

## 2019-04-17 DIAGNOSIS — O98912 Unspecified maternal infectious and parasitic disease complicating pregnancy, second trimester: Secondary | ICD-10-CM

## 2019-04-17 DIAGNOSIS — O09292 Supervision of pregnancy with other poor reproductive or obstetric history, second trimester: Secondary | ICD-10-CM

## 2019-04-17 DIAGNOSIS — O99212 Obesity complicating pregnancy, second trimester: Secondary | ICD-10-CM

## 2019-04-17 DIAGNOSIS — O99210 Obesity complicating pregnancy, unspecified trimester: Secondary | ICD-10-CM

## 2019-04-17 MED ORDER — FAMOTIDINE 20 MG PO TABS: 20.0000 mg | ORAL_TABLET | Freq: Two times a day (BID) | ORAL | 3 refills | Status: DC

## 2019-04-17 NOTE — Progress Notes (Signed)
Pt presents for ROB c/o upper gastric pain after eating.

## 2019-04-17 NOTE — Progress Notes (Signed)
   PRENATAL VISIT NOTE  Subjective:  Brittany Fritz is a 33 y.o. [phone] at [redacted]w[redacted]d being seen today for ongoing prenatal care.  She is currently monitored for the following issues for this low-risk pregnancy and has Immigrant with language difficulty; History of gestational diabetes mellitus (GDM) in prior pregnancy, currently pregnant; Supervision of other normal pregnancy, antepartum; Maternal obesity, antepartum; and GBS bacteriuria on their problem list.  Patient reports backache.  Contractions: Not present. Vag. Bleeding: None.  Movement: Present. Denies leaking of fluid.   The following portions of the patient's history were reviewed and updated as appropriate: allergies, current medications, past family history, past medical history, past social history, past surgical history and problem list.   Objective:   Vitals:   04/17/19 0852  BP: 99/65  Pulse: (!) 101  Temp: (!) 97.2 F (36.2 C)  Weight: 218 lb 6.4 oz (99.1 kg)    Fetal Status: Fetal Heart Rate (bpm): 154   Movement: Present     General:  Alert, oriented and cooperative. Patient is in no acute distress.  Skin: Skin is warm and dry. No rash noted.   Cardiovascular: Normal heart rate noted  Respiratory: Normal respiratory effort, no problems with respiration noted  Abdomen: Soft, gravid, appropriate for gestational age.  Pain/Pressure: Absent     Pelvic: Cervical exam deferred        Extremities: Normal range of motion.  Edema: None  Mental Status: Normal mood and affect. Normal behavior. Normal judgment and thought content.   Assessment and Plan:  Pregnancy: [id number] at [redacted]w[redacted]d 1. Supervision of other normal pregnancy, antepartum Patient is doing well complaining of back pain and epigastric pain with meals Advised patient to perform back exercises Rx pepcid and maternity belt provided AFP today Anatomy ultrasound on 8/3 - AFP, Serum, Open Spina Bifida  2. GBS bacteriuria Prophylaxis in labor  3. History of  gestational diabetes mellitus (GDM) in prior pregnancy, currently pregnant Normal A1c  4. Maternal obesity, antepartum Continue ASA  Preterm labor symptoms and general obstetric precautions including but not limited to vaginal bleeding, contractions, leaking of fluid and fetal movement were reviewed in detail with the patient. Please refer to After Visit Summary for other counseling recommendations.   Return in about 4 weeks (around 05/15/2019) for [facility] [place].  Future Appointments  Date Time Provider [place]  05/01/2019  9:30 AM [facility] LAB [facility] WOC  05/01/2019 10:15 AM WH-MFC [place] 4 WH-MFCUS MFC-US    Mora Bellman, MD

## 2019-04-20 ENCOUNTER — Other Ambulatory Visit (HOSPITAL_COMMUNITY): Payer: Self-pay | Admitting: Obstetrics and Gynecology

## 2019-04-20 NOTE — Addendum Note (Signed)
Addended by: Mora Bellman on: 04/20/2019 07:50 AM   Modules accepted: Orders

## 2019-04-26 ENCOUNTER — Other Ambulatory Visit: Payer: Self-pay

## 2019-04-26 DIAGNOSIS — Z3480 Encounter for supervision of other normal pregnancy, unspecified trimester: Secondary | ICD-10-CM

## 2019-05-01 ENCOUNTER — Other Ambulatory Visit: Payer: Medicaid Other

## 2019-05-01 ENCOUNTER — Other Ambulatory Visit: Payer: Self-pay

## 2019-05-01 ENCOUNTER — Other Ambulatory Visit (HOSPITAL_COMMUNITY): Payer: Self-pay | Admitting: *Deleted

## 2019-05-01 ENCOUNTER — Ambulatory Visit (HOSPITAL_COMMUNITY)
Admission: RE | Admit: 2019-05-01 | Discharge: 2019-05-01 | Disposition: A | Discharge status: Home / Self Care | Payer: Medicaid Other | Source: Ambulatory Visit | Attending: Obstetrics and Gynecology | Admitting: Obstetrics and Gynecology

## 2019-05-01 ENCOUNTER — Ambulatory Visit (HOSPITAL_COMMUNITY): Payer: Medicaid Other

## 2019-05-01 DIAGNOSIS — Z3A25 25 weeks gestation of pregnancy: Secondary | ICD-10-CM

## 2019-05-01 DIAGNOSIS — Z363 Encounter for antenatal screening for malformations: Secondary | ICD-10-CM | POA: Diagnosis not present

## 2019-05-01 DIAGNOSIS — O289 Unspecified abnormal findings on antenatal screening of mother: Secondary | ICD-10-CM

## 2019-05-01 DIAGNOSIS — O09292 Supervision of pregnancy with other poor reproductive or obstetric history, second trimester: Secondary | ICD-10-CM

## 2019-05-01 DIAGNOSIS — Z362 Encounter for other antenatal screening follow-up: Secondary | ICD-10-CM

## 2019-05-01 DIAGNOSIS — Z3480 Encounter for supervision of other normal pregnancy, unspecified trimester: Secondary | ICD-10-CM | POA: Diagnosis not present

## 2019-05-01 DIAGNOSIS — O99212 Obesity complicating pregnancy, second trimester: Secondary | ICD-10-CM | POA: Diagnosis not present

## 2019-05-01 LAB — AFP, SERUM, OPEN SPINA BIFIDA
AFP MoM: 1.31
AFP Value: 91.4 ng/mL
Gest. Age on Collection Date: 23.4 weeks
Maternal Age At EDD: 31.4 yr
OSBR Risk 1 IN: 4664
Test Results:: NEGATIVE
Weight: 218 [lb_av]

## 2019-05-15 ENCOUNTER — Encounter: Payer: Medicaid Other | Admitting: Obstetrics and Gynecology

## 2019-05-16 ENCOUNTER — Other Ambulatory Visit: Payer: Medicaid Other

## 2019-05-17 ENCOUNTER — Other Ambulatory Visit: Payer: Self-pay

## 2019-05-17 ENCOUNTER — Encounter: Payer: Self-pay | Admitting: Obstetrics and Gynecology

## 2019-05-17 ENCOUNTER — Ambulatory Visit (INDEPENDENT_AMBULATORY_CARE_PROVIDER_SITE_OTHER): Payer: Medicaid Other | Admitting: Obstetrics and Gynecology

## 2019-05-17 DIAGNOSIS — O99210 Obesity complicating pregnancy, unspecified trimester: Secondary | ICD-10-CM

## 2019-05-17 DIAGNOSIS — R8271 Bacteriuria: Secondary | ICD-10-CM

## 2019-05-17 DIAGNOSIS — O09292 Supervision of pregnancy with other poor reproductive or obstetric history, second trimester: Secondary | ICD-10-CM

## 2019-05-17 DIAGNOSIS — Z3480 Encounter for supervision of other normal pregnancy, unspecified trimester: Secondary | ICD-10-CM

## 2019-05-17 DIAGNOSIS — Z3A27 27 weeks gestation of pregnancy: Secondary | ICD-10-CM

## 2019-05-17 DIAGNOSIS — Z8632 Personal history of gestational diabetes: Secondary | ICD-10-CM

## 2019-05-17 DIAGNOSIS — O09299 Supervision of pregnancy with other poor reproductive or obstetric history, unspecified trimester: Secondary | ICD-10-CM

## 2019-05-17 DIAGNOSIS — O99212 Obesity complicating pregnancy, second trimester: Secondary | ICD-10-CM | POA: Diagnosis not present

## 2019-05-17 DIAGNOSIS — O99820 Streptococcus B carrier state complicating pregnancy: Secondary | ICD-10-CM | POA: Diagnosis not present

## 2019-05-17 NOTE — Progress Notes (Signed)
   TELEHEALTH VIRTUAL OBSTETRICS VISIT ENCOUNTER NOTE  I connected with Brittany Fritz on 05/17/19 at  1:15 PM EDT by telephone at home and verified that I am speaking with the correct person using two identifiers.   I discussed the limitations, risks, security and privacy concerns of performing an evaluation and management service by telephone and the availability of in person appointments. I also discussed with the patient that there may be a patient responsible charge related to this service. The patient expressed understanding and agreed to proceed.  [name]  Subjective:  Brittany Fritz is a 33 y.o. [id number] at [redacted]w[redacted]d being followed for ongoing prenatal care.  She is currently monitored for the following issues for this high-risk pregnancy and has Immigrant with language difficulty; History of gestational diabetes mellitus (GDM) in prior pregnancy, currently pregnant; Supervision of other normal pregnancy, antepartum; Maternal obesity, antepartum; and GBS bacteriuria on their problem list.  Patient reports no complaints. Reports dark circles under her eyes and nails that ar breaking. Reports fetal movement. Denies any contractions, bleeding or leaking of fluid.   The following portions of the patient's history were reviewed and updated as appropriate: allergies, current medications, past family history, past medical history, past social history, past surgical history and problem list.   Objective:   General:  Alert, oriented and cooperative.   Mental Status: Normal mood and affect perceived. Normal judgment and thought content.  Rest of physical exam deferred due to type of encounter  Assessment and Plan:  Pregnancy: [id number] at [redacted]w[redacted]d  1. Supervision of other normal pregnancy, antepartum Has 2 hr GTT tomorrow Needs Tdap when comes for 3rd trim lab work  2. Maternal obesity, antepartum  3. GBS bacteriuria ppx in labor  4. History of gestational diabetes mellitus (GDM) in prior pregnancy,  currently pregnant   Preterm labor symptoms and general obstetric precautions including but not limited to vaginal bleeding, contractions, leaking of fluid and fetal movement were reviewed in detail with the patient.  I discussed the assessment and treatment plan with the patient. The patient was provided an opportunity to ask questions and all were answered. The patient agreed with the plan and demonstrated an understanding of the instructions. The patient was advised to call back or seek an in-person office evaluation/go to MAU at [facility] for any urgent or concerning symptoms. Please refer to After Visit Summary for other counseling recommendations.   I provided 15 minutes of non-face-to-face time during this encounter.  Return in about 2 weeks (around 05/31/2019) for OB visit (MD), virtual.  Future Appointments  Date Time Provider [place]  05/18/2019  8:00 AM [facility] LAB [facility] None  06/02/2019  7:45 AM WH-MFC [place] 2 WH-MFCUS MFC-US     M , [place] for [place], [place]

## 2019-05-18 ENCOUNTER — Other Ambulatory Visit: Payer: Medicaid Other

## 2019-05-18 DIAGNOSIS — Z3480 Encounter for supervision of other normal pregnancy, unspecified trimester: Secondary | ICD-10-CM

## 2019-05-19 LAB — CBC
Hematocrit: 31.8 % — ABNORMAL LOW (ref 34.0–46.6)
Hemoglobin: 10.5 g/dL — ABNORMAL LOW (ref 11.1–15.9)
MCH: 25.3 pg — ABNORMAL LOW (ref 26.6–33.0)
MCHC: 33 g/dL (ref 31.5–35.7)
MCV: 77 fL — ABNORMAL LOW (ref 79–97)
Platelets: 161 10*3/uL (ref 150–450)
RBC: 4.15 x10E6/uL (ref 3.77–5.28)
RDW: 14.7 % (ref 11.7–15.4)
WBC: 8.5 10*3/uL (ref 3.4–10.8)

## 2019-05-19 LAB — RPR: RPR Ser Ql: NONREACTIVE

## 2019-05-19 LAB — GLUCOSE TOLERANCE, 2 HOURS W/ 1HR
Glucose, 1 hour: 193 mg/dL — ABNORMAL HIGH (ref 65–179)
Glucose, 2 hour: 151 mg/dL (ref 65–152)
Glucose, Fasting: 86 mg/dL (ref 65–91)

## 2019-05-19 LAB — HIV ANTIBODY (ROUTINE TESTING W REFLEX): HIV Screen 4th Generation wRfx: NONREACTIVE

## 2019-05-22 ENCOUNTER — Encounter: Payer: Self-pay | Admitting: Obstetrics and Gynecology

## 2019-05-22 DIAGNOSIS — O24419 Gestational diabetes mellitus in pregnancy, unspecified control: Secondary | ICD-10-CM | POA: Insufficient documentation

## 2019-05-25 ENCOUNTER — Other Ambulatory Visit: Payer: Self-pay | Admitting: *Deleted

## 2019-05-25 DIAGNOSIS — Z3480 Encounter for supervision of other normal pregnancy, unspecified trimester: Secondary | ICD-10-CM

## 2019-05-25 DIAGNOSIS — O24419 Gestational diabetes mellitus in pregnancy, unspecified control: Secondary | ICD-10-CM

## 2019-05-25 MED ORDER — ACCU-CHEK GUIDE ME W/DEVICE KIT: 1.0000 | PACK | Freq: Once | 0 refills | Status: AC

## 2019-05-25 MED ORDER — ACCU-CHEK GUIDE VI STRP: ORAL_STRIP | 12 refills | Status: DC

## 2019-05-25 MED ORDER — ACCU-CHEK FASTCLIX LANCETS MISC: 1.0000 | Freq: Four times a day (QID) | 12 refills | Status: DC

## 2019-05-25 NOTE — Progress Notes (Signed)
Glucose testing supplies ordered. Pt aware.

## 2019-05-31 ENCOUNTER — Ambulatory Visit (INDEPENDENT_AMBULATORY_CARE_PROVIDER_SITE_OTHER): Payer: Medicaid Other | Admitting: Obstetrics and Gynecology

## 2019-05-31 ENCOUNTER — Encounter: Payer: Self-pay | Admitting: Obstetrics and Gynecology

## 2019-05-31 VITALS — BP 126/64 | HR 117

## 2019-05-31 DIAGNOSIS — Z3480 Encounter for supervision of other normal pregnancy, unspecified trimester: Secondary | ICD-10-CM

## 2019-05-31 DIAGNOSIS — R8271 Bacteriuria: Secondary | ICD-10-CM

## 2019-05-31 DIAGNOSIS — O24410 Gestational diabetes mellitus in pregnancy, diet controlled: Secondary | ICD-10-CM

## 2019-05-31 DIAGNOSIS — Z3A29 29 weeks gestation of pregnancy: Secondary | ICD-10-CM | POA: Diagnosis not present

## 2019-05-31 DIAGNOSIS — O99820 Streptococcus B carrier state complicating pregnancy: Secondary | ICD-10-CM

## 2019-05-31 NOTE — Progress Notes (Signed)
Pt states she is having pain in right leg. Pt states she is having a hard time moving, pain with walking. Pt advised she may use Tylenol and Heat pad as needed.  Pt states small amount swelling in feet.   Pt states she does not want to go to diabetic education.  Pt has not yet picked up testing supplies for diabetes.  Pt advised to pick up as soon as possible and start testing.

## 2019-05-31 NOTE — Progress Notes (Signed)
   TELEHEALTH VIRTUAL OBSTETRICS VISIT ENCOUNTER NOTE  I connected with Brittany Fritz on 05/31/19 at  4:15 PM EDT by telephone at home and verified that I am speaking with the correct person using two identifiers.   I discussed the limitations, risks, security and privacy concerns of performing an evaluation and management service by telephone and the availability of in person appointments. I also discussed with the patient that there may be a patient responsible charge related to this service. The patient expressed understanding and agreed to proceed.  Subjective:  Brittany Fritz is a 33 y.o. [phone] at [redacted]w[redacted]d being followed for ongoing prenatal care.  She is currently monitored for the following issues for this high-risk pregnancy and has Immigrant with language difficulty; History of gestational diabetes mellitus (GDM) in prior pregnancy, currently pregnant; Supervision of other normal pregnancy, antepartum; Maternal obesity, antepartum; GBS bacteriuria; and Gestational diabetes mellitus on their problem list.  Patient reports no complaints. Reports fetal movement. Denies any contractions, bleeding or leaking of fluid.   The following portions of the patient's history were reviewed and updated as appropriate: allergies, current medications, past family history, past medical history, past social history, past surgical history and problem list.   Objective:   General:  Alert, oriented and cooperative.   Mental Status: Normal mood and affect perceived. Normal judgment and thought content.  Rest of physical exam deferred due to type of encounter  Assessment and Plan:  Pregnancy: [id number] at [redacted]w[redacted]d 1. Supervision of other normal pregnancy, antepartum Stable  2. Diet controlled gestational diabetes mellitus (GDM) in third trimester Has not obtained DM supplies yet.Imprtance of obtaining supplies stressed. GDM reviewed with pt and husband. Importance of checking CBG's and following diet reviewed.   Pt instructed to bring CBG's to all OB appts Pt declined DM education referral F/U OB U/S this Friday 3. GBS bacteriuria Tx while in labor  Preterm labor symptoms and general obstetric precautions including but not limited to vaginal bleeding, contractions, leaking of fluid and fetal movement were reviewed in detail with the patient.  I discussed the assessment and treatment plan with the patient. The patient was provided an opportunity to ask questions and all were answered. The patient agreed with the plan and demonstrated an understanding of the instructions. The patient was advised to call back or seek an in-person office evaluation/go to MAU at [facility] for any urgent or concerning symptoms. Please refer to After Visit Summary for other counseling recommendations.   I provided 10 minutes of non-face-to-face time during this encounter.  Return in about 1 week (around 06/07/2019) for OB visit, face to face.  Future Appointments  Date Time Provider [place]  06/02/2019  7:45 AM WH-MFC [place] 2 WH-MFCUS MFC-US    Iver Fehrenbach L Candler Ginsberg, [place] for [facility], [place]

## 2019-06-02 ENCOUNTER — Other Ambulatory Visit: Payer: Self-pay

## 2019-06-02 ENCOUNTER — Ambulatory Visit (HOSPITAL_COMMUNITY)
Admission: RE | Admit: 2019-06-02 | Discharge: 2019-06-02 | Disposition: A | Discharge status: Home / Self Care | Payer: Medicaid Other | Source: Ambulatory Visit | Attending: Obstetrics and Gynecology | Admitting: Obstetrics and Gynecology

## 2019-06-02 ENCOUNTER — Other Ambulatory Visit (HOSPITAL_COMMUNITY): Payer: Self-pay | Admitting: *Deleted

## 2019-06-02 DIAGNOSIS — Z3A30 30 weeks gestation of pregnancy: Secondary | ICD-10-CM

## 2019-06-02 DIAGNOSIS — O99213 Obesity complicating pregnancy, third trimester: Secondary | ICD-10-CM | POA: Diagnosis not present

## 2019-06-02 DIAGNOSIS — O289 Unspecified abnormal findings on antenatal screening of mother: Secondary | ICD-10-CM

## 2019-06-02 DIAGNOSIS — Z362 Encounter for other antenatal screening follow-up: Secondary | ICD-10-CM | POA: Diagnosis not present

## 2019-06-02 DIAGNOSIS — O09293 Supervision of pregnancy with other poor reproductive or obstetric history, third trimester: Secondary | ICD-10-CM | POA: Diagnosis not present

## 2019-06-02 DIAGNOSIS — R772 Abnormality of alphafetoprotein: Secondary | ICD-10-CM

## 2019-06-30 ENCOUNTER — Encounter (HOSPITAL_COMMUNITY): Payer: Self-pay

## 2019-06-30 ENCOUNTER — Ambulatory Visit (HOSPITAL_COMMUNITY): Payer: Medicaid Other | Attending: Obstetrics

## 2019-06-30 ENCOUNTER — Ambulatory Visit (HOSPITAL_COMMUNITY): Admission: RE | Admit: 2019-06-30 | Payer: Medicaid Other | Source: Ambulatory Visit

## 2019-07-11 ENCOUNTER — Other Ambulatory Visit: Payer: Self-pay

## 2019-07-11 ENCOUNTER — Ambulatory Visit (INDEPENDENT_AMBULATORY_CARE_PROVIDER_SITE_OTHER): Payer: Medicaid Other | Admitting: Advanced Practice Midwife

## 2019-07-11 VITALS — BP 100/64 | HR 86 | Wt 224.5 lb

## 2019-07-11 DIAGNOSIS — G5600 Carpal tunnel syndrome, unspecified upper limb: Secondary | ICD-10-CM

## 2019-07-11 DIAGNOSIS — Z3480 Encounter for supervision of other normal pregnancy, unspecified trimester: Secondary | ICD-10-CM

## 2019-07-11 DIAGNOSIS — O26899 Other specified pregnancy related conditions, unspecified trimester: Secondary | ICD-10-CM

## 2019-07-11 DIAGNOSIS — Z3A35 35 weeks gestation of pregnancy: Secondary | ICD-10-CM

## 2019-07-11 DIAGNOSIS — R937 Abnormal findings on diagnostic imaging of other parts of musculoskeletal system: Secondary | ICD-10-CM

## 2019-07-11 DIAGNOSIS — O26893 Other specified pregnancy related conditions, third trimester: Secondary | ICD-10-CM

## 2019-07-11 MED ORDER — WRIST BRACE/LEFT SMALL MISC: 1.0000 | Freq: Every day | 0 refills | Status: DC

## 2019-07-11 MED ORDER — WRIST BRACE/RIGHT SMALL MISC: 1.0000 | Freq: Every day | 0 refills | Status: DC

## 2019-07-11 NOTE — Progress Notes (Signed)
ROB.  C/o numbness in hands, shoulder pain 8/10 x 3 weeks, swelling in hands and feet and pressure.   Declined Flu and Tdap Vaccines.

## 2019-07-11 NOTE — Patient Instructions (Addendum)
Third Trimester of Pregnancy The third trimester is from week 28 through week 40 (months 7 through 9). The third trimester is a time when the unborn baby (fetus) is growing rapidly. At the end of the ninth month, the fetus is about 20 inches in length and weighs 6-10 pounds. Body changes during your third trimester Your body will continue to go through many changes during pregnancy. The changes vary from woman to woman. During the third trimester:  Your weight will continue to increase. You can expect to gain 25-35 pounds (11-16 kg) by the end of the pregnancy.  You may begin to get stretch marks on your hips, abdomen, and breasts.  You may urinate more often because the fetus is moving lower into your pelvis and pressing on your bladder.  You may develop or continue to have heartburn. This is caused by increased hormones that slow down muscles in the digestive tract.  You may develop or continue to have constipation because increased hormones slow digestion and cause the muscles that push waste through your intestines to relax.  You may develop hemorrhoids. These are swollen veins (varicose veins) in the rectum that can itch or be painful.  You may develop swollen, bulging veins (varicose veins) in your legs.  You may have increased body aches in the pelvis, back, or thighs. This is due to weight gain and increased hormones that are relaxing your joints.  You may have changes in your hair. These can include thickening of your hair, rapid growth, and changes in texture. Some women also have hair loss during or after pregnancy, or hair that feels dry or thin. Your hair will most likely return to normal after your baby is born.  Your breasts will continue to grow and they will continue to become tender. A yellow fluid (colostrum) may leak from your breasts. This is the first milk you are producing for your baby.  Your belly button may stick out.  You may notice more swelling in your hands,  face, or ankles.  You may have increased tingling or numbness in your hands, arms, and legs. The skin on your belly may also feel numb.  You may feel short of breath because of your expanding uterus.  You may have more problems sleeping. This can be caused by the size of your belly, increased need to urinate, and an increase in your body's metabolism.  You may notice the fetus "dropping," or moving lower in your abdomen (lightening).  You may have increased vaginal discharge.  You may notice your joints feel loose and you may have pain around your pelvic bone. What to expect at prenatal visits You will have prenatal exams every 2 weeks until week 36. Then you will have weekly prenatal exams. During a routine prenatal visit:  You will be weighed to make sure you and the baby are growing normally.  Your blood pressure will be taken.  Your abdomen will be measured to track your baby's growth.  The fetal heartbeat will be listened to.  Any test results from the previous visit will be discussed.  You may have a cervical check near your due date to see if your cervix has softened or thinned (effaced).  You will be tested for Group B streptococcus. This happens between 35 and 37 weeks. Your health care provider may ask you:  What your birth plan is.  How you are feeling.  If you are feeling the baby move.  If you have had any abnormal   symptoms, such as leaking fluid, bleeding, severe headaches, or abdominal cramping.  If you are using any tobacco products, including cigarettes, chewing tobacco, and electronic cigarettes.  If you have any questions. Other tests or screenings that may be performed during your third trimester include:  Blood tests that check for low iron levels (anemia).  Fetal testing to check the health, activity level, and growth of the fetus. Testing is done if you have certain medical conditions or if there are problems during the pregnancy.  Nonstress test  (NST). This test checks the health of your baby to make sure there are no signs of problems, such as the baby not getting enough oxygen. During this test, a belt is placed around your belly. The baby is made to move, and its heart rate is monitored during movement. What is false labor? False labor is a condition in which you feel small, irregular tightenings of the muscles in the womb (contractions) that usually go away with rest, changing position, or drinking water. These are called Braxton Hicks contractions. Contractions may last for hours, days, or even weeks before true labor sets in. If contractions come at regular intervals, become more frequent, increase in intensity, or become painful, you should see your health care provider. What are the signs of labor?  Abdominal cramps.  Regular contractions that start at 10 minutes apart and become stronger and more frequent with time.  Contractions that start on the top of the uterus and spread down to the lower abdomen and back.  Increased pelvic pressure and dull back pain.  A watery or bloody mucus discharge that comes from the vagina.  Leaking of amniotic fluid. This is also known as your "water breaking." It could be a slow trickle or a gush. Let your health care provider know if it has a color or strange odor. If you have any of these signs, call your health care provider right away, even if it is before your due date. Follow these instructions at home: Medicines  Follow your health care provider's instructions regarding medicine use. Specific medicines may be either safe or unsafe to take during pregnancy.  Take a prenatal vitamin that contains at least 600 micrograms (mcg) of folic acid.  If you develop constipation, try taking a stool softener if your health care provider approves. Eating and drinking   Eat a balanced diet that includes fresh fruits and vegetables, whole grains, good sources of protein such as meat, eggs, or tofu,  and low-fat dairy. Your health care provider will help you determine the amount of weight gain that is right for you.  Avoid raw meat and uncooked cheese. These carry germs that can cause birth defects in the baby.  If you have low calcium intake from food, talk to your health care provider about whether you should take a daily calcium supplement.  Eat four or five small meals rather than three large meals a day.  Limit foods that are high in fat and processed sugars, such as fried and sweet foods.  To prevent constipation: ? Drink enough fluid to keep your urine clear or pale yellow. ? Eat foods that are high in fiber, such as fresh fruits and vegetables, whole grains, and beans. Activity  Exercise only as directed by your health care provider. Most women can continue their usual exercise routine during pregnancy. Try to exercise for 30 minutes at least 5 days a week. Stop exercising if you experience uterine contractions.  Avoid heavy lifting.  Do   not exercise in extreme heat or humidity, or at high altitudes.  Wear low-heel, comfortable shoes.  Practice good posture.  You may continue to have sex unless your health care provider tells you otherwise. Relieving pain and discomfort  Take frequent breaks and rest with your legs elevated if you have leg cramps or low back pain.  Take warm sitz baths to soothe any pain or discomfort caused by hemorrhoids. Use hemorrhoid cream if your health care provider approves.  Wear a good support bra to prevent discomfort from breast tenderness.  If you develop varicose veins: ? Wear support pantyhose or compression stockings as told by your healthcare provider. ? Elevate your feet for 15 minutes, 3-4 times a day. Prenatal care  Write down your questions. Take them to your prenatal visits.  Keep all your prenatal visits as told by your health care provider. This is important. Safety  Wear your seat belt at all times when driving.  Make  a list of emergency phone numbers, including numbers for family, friends, the hospital, and police and fire departments. General instructions  Avoid cat litter boxes and soil used by cats. These carry germs that can cause birth defects in the baby. If you have a cat, ask someone to clean the litter box for you.  Do not travel far distances unless it is absolutely necessary and only with the approval of your health care provider.  Do not use hot tubs, steam rooms, or saunas.  Do not drink alcohol.  Do not use any products that contain nicotine or tobacco, such as cigarettes and e-cigarettes. If you need help quitting, ask your health care provider.  Do not use any medicinal herbs or unprescribed drugs. These chemicals affect the formation and growth of the baby.  Do not douche or use tampons or scented sanitary pads.  Do not cross your legs for long periods of time.  To prepare for the arrival of your baby: ? Take prenatal classes to understand, practice, and ask questions about labor and delivery. ? Make a trial run to the hospital. ? Visit the hospital and tour the maternity area. ? Arrange for maternity or paternity leave through employers. ? Arrange for family and friends to take care of pets while you are in the hospital. ? Purchase a rear-facing car seat and make sure you know how to install it in your car. ? Pack your hospital bag. ? Prepare the baby's nursery. Make sure to remove all pillows and stuffed animals from the baby's crib to prevent suffocation.  Visit your dentist if you have not gone during your pregnancy. Use a soft toothbrush to brush your teeth and be gentle when you floss. Contact a health care provider if:  You are unsure if you are in labor or if your water has broken.  You become dizzy.  You have mild pelvic cramps, pelvic pressure, or nagging pain in your abdominal area.  You have lower back pain.  You have persistent nausea, vomiting, or diarrhea.   You have an unusual or bad smelling vaginal discharge.  You have pain when you urinate. Get help right away if:  Your water breaks before 37 weeks.  You have regular contractions less than 5 minutes apart before 37 weeks.  You have a fever.  You are leaking fluid from your vagina.  You have spotting or bleeding from your vagina.  You have severe abdominal pain or cramping.  You have rapid weight loss or weight gain.  You have   shortness of breath with chest pain.  You notice sudden or extreme swelling of your face, hands, ankles, feet, or legs.  Your baby makes fewer than 10 movements in 2 hours.  You have severe headaches that do not go away when you take medicine.  You have vision changes. Summary  The third trimester is from week 28 through week 40, months 7 through 9. The third trimester is a time when the unborn baby (fetus) is growing rapidly.  During the third trimester, your discomfort may increase as you and your baby continue to gain weight. You may have abdominal, leg, and back pain, sleeping problems, and an increased need to urinate.  During the third trimester your breasts will keep growing and they will continue to become tender. A yellow fluid (colostrum) may leak from your breasts. This is the first milk you are producing for your baby.  False labor is a condition in which you feel small, irregular tightenings of the muscles in the womb (contractions) that eventually go away. These are called Braxton Hicks contractions. Contractions may last for hours, days, or even weeks before true labor sets in.  Signs of labor can include: abdominal cramps; regular contractions that start at 10 minutes apart and become stronger and more frequent with time; watery or bloody mucus discharge that comes from the vagina; increased pelvic pressure and dull back pain; and leaking of amniotic fluid. This information is not intended to replace advice given to you by your health  care provider. Make sure you discuss any questions you have with your health care provider. Document Released: 09/08/2001 Document Revised: 01/05/2019 Document Reviewed: 10/20/2016 Elsevier Patient Education  2020 [employer]  

## 2019-07-11 NOTE — Progress Notes (Signed)
   PRENATAL VISIT NOTE  Subjective:  Brittany Fritz is a 33 y.o. [phone] at [redacted]w[redacted]d being seen today for ongoing prenatal care.  She is currently monitored for the following issues for this low-risk pregnancy and has Immigrant with language difficulty; History of gestational diabetes mellitus (GDM) in prior pregnancy, currently pregnant; Supervision of other normal pregnancy, antepartum; Maternal obesity, antepartum; GBS bacteriuria; and Gestational diabetes mellitus on their problem list.  Patient reports pain in her shoulders and numbness/tingling of her hands.  Contractions: Irregular. Vag. Bleeding: Scant.  Movement: Present. Denies leaking of fluid.   The following portions of the patient's history were reviewed and updated as appropriate: allergies, current medications, past family history, past medical history, past social history, past surgical history and problem list.   Objective:   Vitals:   07/11/19 0829  BP: 100/64  Pulse: 86  Weight: 224 lb 8 oz (101.8 kg)    Fetal Status: Fetal Heart Rate (bpm): 154   Movement: Present     General:  Alert, oriented and cooperative. Patient is in no acute distress.  Skin: Skin is warm and dry. No rash noted.   Cardiovascular: Normal heart rate noted  Respiratory: Normal respiratory effort, no problems with respiration noted  Abdomen: Soft, gravid, appropriate for gestational age.  Pain/Pressure: Present     Pelvic: Cervical exam deferred        Extremities: Normal range of motion.  Edema: Trace  Mental Status: Normal mood and affect. Normal behavior. Normal judgment and thought content.   Assessment and Plan:  Pregnancy: [id number] at [redacted]w[redacted]d 1. Supervision of other normal pregnancy, antepartum --Anticipatory guidance about next visits/weeks of pregnancy given. --Pt asked about Vitamin D and iron in pregnancy and breastfeeding. Questions answered. Will check today at pt request.  - CBC - Vitamin D (25 hydroxy) --Next visit in 2 weeks, in  office  2. Carpal tunnel syndrome during pregnancy  - Elastic Bandages & Supports (WRIST BRACE/LEFT SMALL) MISC; 1 Device by Does not apply route daily.  Dispense: 1 each; Refill: 0 - Elastic Bandages & Supports (WRIST BRACE/RIGHT SMALL) MISC; 1 Device by Does not apply route daily.  Dispense: 1 each; Refill: 0  3. 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  She has some neck and shoulder pain related to this musculoskeletal finding outside of pregnancy but it has worsened in recent weeks. --Rest/ice/heat/Tylenol PRN  Preterm labor symptoms and general obstetric precautions including but not limited to vaginal bleeding, contractions, leaking of fluid and fetal movement were reviewed in detail with the patient. Please refer to After Visit Summary for other counseling recommendations.   Return in about 2 weeks (around 07/25/2019).  Future Appointments  Date Time Provider [place]  07/25/2019  8:15 AM Leftwich-Kirby, Kathie Dike, CNM [facility] None    Fatima Blank, CNM

## 2019-07-12 LAB — CBC
Hematocrit: 34.4 % (ref 34.0–46.6)
Hemoglobin: 10.4 g/dL — ABNORMAL LOW (ref 11.1–15.9)
MCH: 22.6 pg — ABNORMAL LOW (ref 26.6–33.0)
MCHC: 30.2 g/dL — ABNORMAL LOW (ref 31.5–35.7)
MCV: 75 fL — ABNORMAL LOW (ref 79–97)
Platelets: 137 10*3/uL — ABNORMAL LOW (ref 150–450)
RBC: 4.61 x10E6/uL (ref 3.77–5.28)
RDW: 16.5 % — ABNORMAL HIGH (ref 11.7–15.4)
WBC: 7.9 10*3/uL (ref 3.4–10.8)

## 2019-07-12 LAB — VITAMIN D 25 HYDROXY (VIT D DEFICIENCY, FRACTURES): Vit D, 25-Hydroxy: 10.7 ng/mL — ABNORMAL LOW (ref 30.0–100.0)

## 2019-07-14 ENCOUNTER — Other Ambulatory Visit: Payer: Self-pay | Admitting: Advanced Practice Midwife

## 2019-07-14 DIAGNOSIS — R7989 Other specified abnormal findings of blood chemistry: Secondary | ICD-10-CM

## 2019-07-14 MED ORDER — VITAMIN D3 50 MCG (2000 UT) PO CAPS: 2000.0000 [IU] | ORAL_CAPSULE | Freq: Every day | ORAL | 2 refills | Status: DC

## 2019-07-15 ENCOUNTER — Inpatient Hospital Stay (HOSPITAL_COMMUNITY)
Admission: AD | Admit: 2019-07-15 | Discharge: 2019-07-16 | Disposition: A | Discharge status: Home / Self Care | Payer: Medicaid Other | Attending: Obstetrics & Gynecology | Admitting: Obstetrics & Gynecology

## 2019-07-15 ENCOUNTER — Encounter (HOSPITAL_COMMUNITY): Payer: Self-pay

## 2019-07-15 ENCOUNTER — Other Ambulatory Visit: Payer: Self-pay

## 2019-07-15 DIAGNOSIS — Z880 Allergy status to penicillin: Secondary | ICD-10-CM | POA: Insufficient documentation

## 2019-07-15 DIAGNOSIS — O4703 False labor before 37 completed weeks of gestation, third trimester: Secondary | ICD-10-CM | POA: Insufficient documentation

## 2019-07-15 DIAGNOSIS — O2243 Hemorrhoids in pregnancy, third trimester: Secondary | ICD-10-CM | POA: Insufficient documentation

## 2019-07-15 DIAGNOSIS — O2240 Hemorrhoids in pregnancy, unspecified trimester: Secondary | ICD-10-CM

## 2019-07-15 DIAGNOSIS — Z79899 Other long term (current) drug therapy: Secondary | ICD-10-CM | POA: Insufficient documentation

## 2019-07-15 DIAGNOSIS — Z8632 Personal history of gestational diabetes: Secondary | ICD-10-CM | POA: Insufficient documentation

## 2019-07-15 DIAGNOSIS — Z3A36 36 weeks gestation of pregnancy: Secondary | ICD-10-CM | POA: Insufficient documentation

## 2019-07-15 DIAGNOSIS — O479 False labor, unspecified: Secondary | ICD-10-CM

## 2019-07-15 HISTORY — DX: Gestational diabetes mellitus in pregnancy, unspecified control: O24.419

## 2019-07-15 NOTE — MAU Provider Note (Signed)
History     CSN: [id number]  Arrival date and time: 07/15/19 2256   First Provider Initiated Contact with Patient 07/15/19 2326      Chief Complaint  Patient presents with  . Hemorrhoids   HPI   Ms.Brittany Fritz is a 33 y.o. female [phone] @ [redacted]w[redacted]d here in MAU with hemorrhoids. She presented to [facility] center with her husband with complaints of hemorrhoids. They were told that the baby may be coming and she needed to come here for evaluation. She has had constipation which have been playing a role in her hemorrhoids. She has not tried any OTC treatment for the hemorrhoids.  She denies vaginal bleeding. Some occasional contractions that come and go. + fetal movement.   OB History    Gravida  4   Para  2   Term  2   Preterm      AB  1   Living  2     SAB  1   TAB      Ectopic      Multiple  0   Live Births  2           Past Medical History:  Diagnosis Date  . Allergy    Seasonal  . Gestational diabetes   . Medical history non-contributory     Past Surgical History:  Procedure Laterality Date  . NO PAST SURGERIES      Family History  Problem Relation Age of Onset  . Heart disease Father   . Stroke Father     Social History   Tobacco Use  . Smoking status: Never Smoker  . Smokeless tobacco: Never Used  Substance Use Topics  . Alcohol use: No  . Drug use: No    Allergies:  Allergies  Allergen Reactions  . Penicillins Itching and Rash    Has patient had a PCN reaction causing immediate rash, facial/tongue/throat swelling, SOB or lightheadedness with hypotension: Yes Has patient had a PCN reaction causing severe rash involving mucus membranes or skin necrosis: No Has patient had a PCN reaction that required hospitalization No Has patient had a PCN reaction occurring within the last 10 years: Yes If all of the above answers are "NO", then may proceed with Cephalosporin use.     Medications Prior to Admission  Medication Sig  Dispense Refill Last Dose  . Accu-Chek FastClix Lancets MISC 1 each by Percutaneous route 4 (four) times daily. 100 each 12   . aspirin EC 81 MG tablet Take 1 tablet (81 mg total) by mouth daily. Take after 12 weeks for prevention of preeclampsia later in pregnancy (Patient not taking: Reported on 04/17/2019) 300 tablet 2   . Cholecalciferol (VITAMIN D3) 50 MCG (2000 UT) capsule Take 1 capsule (2,000 Units total) by mouth daily. 30 capsule 2   . Elastic Bandages & Supports (WRIST BRACE/LEFT SMALL) MISC 1 Device by Does not apply route daily. 1 each 0   . Elastic Bandages & Supports (WRIST BRACE/RIGHT SMALL) MISC 1 Device by Does not apply route daily. 1 each 0   . famotidine (PEPCID) 20 MG tablet Take 1 tablet (20 mg total) by mouth 2 (two) times daily. 60 tablet 3   . ferrous gluconate (IRON 27) 240 (27 FE) MG tablet Take 1 tablet (240 mg total) by mouth 2 (two) times daily. (Patient not taking: Reported on 02/25/2017) 60 tablet 4   . folic acid (FOLVITE) 1 MG tablet Take 4 tablets (4 mg total) by mouth daily. (  Patient not taking: Reported on 04/17/2019) 120 tablet 12   . glucose blood (ACCU-CHEK GUIDE) test strip Use 1 test strip to check blood glucose 4 times daily 100 each 12   . ibuprofen (ADVIL,MOTRIN) 600 MG tablet Take 1 tablet (600 mg total) by mouth every 6 (six) hours. (Patient not taking: Reported on 03/03/2019) 30 tablet 0   . omeprazole (PRILOSEC) 20 MG capsule Take 1 capsule (20 mg total) by mouth 2 (two) times daily before a meal. (Patient not taking: Reported on 02/25/2017) 60 capsule 5   . Prenat-Fe Poly-Methfol-FA-DHA (VITAFOL ULTRA) 29-0.6-0.4-200 MG CAPS Take 1 tablet by mouth daily. 30 capsule 12   . Prenatal Vit-Fe Fum-Fe Bisg-FA (NATACHEW) 28-1 MG CHEW Chew 1 tablet by mouth at bedtime. (Patient not taking: Reported on 03/20/2019) 30 tablet 12   . senna-docusate (SENOKOT-S) 8.6-50 MG tablet Take 1 tablet by mouth at bedtime as needed for mild constipation. (Patient not taking: Reported  on 03/03/2019) 30 tablet 0    No results found for this or any previous visit (from the past 48 hour(s)).  Review of Systems  Gastrointestinal: Positive for abdominal pain, constipation and rectal pain.  Genitourinary: Negative for vaginal bleeding.   Physical Exam   Blood pressure 114/63, pulse (!) 102, temperature 98.1 F (36.7 C), resp. rate 18, last menstrual period 12/17/2018, SpO2 99 %, unknown if currently breastfeeding.  Physical Exam  Constitutional: She is oriented to person, place, and time. She appears well-developed and well-nourished. No distress.  HENT:  Head: Normocephalic.  Eyes: Pupils are equal, round, and reactive to light.  GI: Soft. She exhibits no distension. There is no abdominal tenderness. There is no rebound.  Genitourinary: Rectum:     External hemorrhoid (? 1 possibly thrombosed, 3 hemorrhoids noted ) present.     Genitourinary Comments: Dilation: 3.5 Presentation: Vertex Exam by:: Venia Carbon, NP   Neurological: She is alert and oriented to person, place, and time.  Skin: Skin is warm. She is not diaphoretic.  Psychiatric: Her behavior is normal.  Fetal Tracing: Baseline: 130 bpm Variability:  Moderate  Accelerations: 15x15 Decelerations: None Toco: occasional   MAU Course  Procedures  None  MDM  Cervix unchanged from previous exam after 1-1.5 hours.   Assessment and Plan   A:  1. Hemorrhoids during pregnancy in third trimester   2. Braxton Hicks contractions   3. [redacted] weeks gestation of pregnancy     P:  Discharge home in stable condition Rx: Nitro cream, Colace  She may need referral to general surgery for management of hemorrhoids Keep your appointment in the office Return to MAU if symptoms worsen  Rasch, Harolyn Rutherford, NP 07/16/2019 10:41 AM

## 2019-07-15 NOTE — MAU Note (Addendum)
Pt c/o hemorrhoids x2 days. Noticed blood in the toilet today, which scared her. Pt also reports a lot of pain with the hemorrhoids, has not taken anything. States she did put ice on them, but did not help. Pt denies constipation. She last pooped tonight and was normal. She denies contractions, LOF or vaginal bleeding. Reports good fetal movement.

## 2019-07-16 ENCOUNTER — Other Ambulatory Visit: Payer: Self-pay

## 2019-07-16 ENCOUNTER — Inpatient Hospital Stay (EMERGENCY_DEPARTMENT_HOSPITAL)
Admission: AD | Admit: 2019-07-16 | Discharge: 2019-07-16 | Disposition: A | Discharge status: Home / Self Care | Payer: Medicaid Other | Source: Home / Self Care | Attending: Obstetrics & Gynecology | Admitting: Obstetrics & Gynecology

## 2019-07-16 DIAGNOSIS — Z79899 Other long term (current) drug therapy: Secondary | ICD-10-CM | POA: Diagnosis not present

## 2019-07-16 DIAGNOSIS — O479 False labor, unspecified: Secondary | ICD-10-CM

## 2019-07-16 DIAGNOSIS — Z3A36 36 weeks gestation of pregnancy: Secondary | ICD-10-CM | POA: Diagnosis not present

## 2019-07-16 DIAGNOSIS — O2240 Hemorrhoids in pregnancy, unspecified trimester: Secondary | ICD-10-CM

## 2019-07-16 DIAGNOSIS — O4703 False labor before 37 completed weeks of gestation, third trimester: Secondary | ICD-10-CM

## 2019-07-16 DIAGNOSIS — Z880 Allergy status to penicillin: Secondary | ICD-10-CM | POA: Diagnosis not present

## 2019-07-16 DIAGNOSIS — O2243 Hemorrhoids in pregnancy, third trimester: Secondary | ICD-10-CM

## 2019-07-16 DIAGNOSIS — Z8632 Personal history of gestational diabetes: Secondary | ICD-10-CM | POA: Diagnosis not present

## 2019-07-16 MED ORDER — ACETAMINOPHEN 500 MG PO TABS
1000.0000 mg | ORAL_TABLET | Freq: Once | ORAL | Status: AC
  Administered 2019-07-16: 19:00:00 1000 mg via ORAL
  Filled 2019-07-16: qty 2

## 2019-07-16 MED ORDER — DOCUSATE SODIUM 100 MG PO CAPS: 100.0000 mg | ORAL_CAPSULE | Freq: Every day | ORAL | 2 refills | Status: DC | PRN

## 2019-07-16 MED ORDER — NITROGLYCERIN 0.4 % RE OINT: 1.0000 cm | TOPICAL_OINTMENT | Freq: Two times a day (BID) | RECTAL | 1 refills | Status: DC | PRN

## 2019-07-16 NOTE — Discharge Instructions (Signed)
Braxton Hicks Contractions °Contractions of the uterus can occur throughout pregnancy, but they are not always a sign that you are in labor. You may have practice contractions called Braxton Hicks contractions. These false labor contractions are sometimes confused with true labor. °What are Braxton Hicks contractions? °Braxton Hicks contractions are tightening movements that occur in the muscles of the uterus before labor. Unlike true labor contractions, these contractions do not result in opening (dilation) and thinning of the cervix. Toward the end of pregnancy (32-34 weeks), Braxton Hicks contractions can happen more often and may become stronger. These contractions are sometimes difficult to tell apart from true labor because they can be very uncomfortable. You should not feel embarrassed if you go to the hospital with false labor. °Sometimes, the only way to tell if you are in true labor is for your health care provider to look for changes in the cervix. The health care provider will do a physical exam and may monitor your contractions. If you are not in true labor, the exam should show that your cervix is not dilating and your water has not broken. °If there are no other health problems associated with your pregnancy, it is completely safe for you to be sent home with false labor. You may continue to have Braxton Hicks contractions until you go into true labor. °How to tell the difference between true labor and false labor °True labor °· Contractions last 30-70 seconds. °· Contractions become very regular. °· Discomfort is usually felt in the top of the uterus, and it spreads to the lower abdomen and low back. °· Contractions do not go away with walking. °· Contractions usually become more intense and increase in frequency. °· The cervix dilates and gets thinner. °False labor °· Contractions are usually shorter and not as strong as true labor contractions. °· Contractions are usually irregular. °· Contractions  are often felt in the front of the lower abdomen and in the groin. °· Contractions may go away when you walk around or change positions while lying down. °· Contractions get weaker and are shorter-lasting as time goes on. °· The cervix usually does not dilate or become thin. °Follow these instructions at home: ° °· Take over-the-counter and prescription medicines only as told by your health care provider. °· Keep up with your usual exercises and follow other instructions from your health care provider. °· Eat and drink lightly if you think you are going into labor. °· If Braxton Hicks contractions are making you uncomfortable: °? Change your position from lying down or resting to walking, or change from walking to resting. °? Sit and rest in a tub of warm water. °? Drink enough fluid to keep your urine pale yellow. Dehydration may cause these contractions. °? Do slow and deep breathing several times an hour. °· Keep all follow-up prenatal visits as told by your health care provider. This is important. °Contact a health care provider if: °· You have a fever. °· You have continuous pain in your abdomen. °Get help right away if: °· Your contractions become stronger, more regular, and closer together. °· You have fluid leaking or gushing from your vagina. °· You pass blood-tinged mucus (bloody show). °· You have bleeding from your vagina. °· You have low back pain that you never had before. °· You feel your baby’s head pushing down and causing pelvic pressure. °· Your baby is not moving inside you as much as it used to. °Summary °· Contractions that occur before labor are   called Braxton Hicks contractions, false labor, or practice contractions.  Braxton Hicks contractions are usually shorter, weaker, farther apart, and less regular than true labor contractions. True labor contractions usually become progressively stronger and regular, and they become more frequent.  Manage discomfort from [facility] contractions  by changing position, resting in a warm bath, drinking plenty of water, or practicing deep breathing. This information is not intended to replace advice given to you by your health care provider. Make sure you discuss any questions you have with your health care provider. Document Released: 01/28/2017 Document Revised: 08/27/2017 Document Reviewed: 01/28/2017 Elsevier Patient Education  2020 [employer].  Hemorrhoids Hemorrhoids are swollen veins that may develop:  In the butt (rectum). These are called internal hemorrhoids.  Around the opening of the butt (anus). These are called external hemorrhoids. Hemorrhoids can cause pain, itching, or bleeding. Most of the time, they do not cause serious problems. They usually get better with diet changes, lifestyle changes, and other home treatments. What are the causes? This condition may be caused by:  Having trouble pooping (constipation).  Pushing hard (straining) to poop.  Watery poop (diarrhea).  Pregnancy.  Being very overweight (obese).  Sitting for long periods of time.  Heavy lifting or other activity that causes you to strain.  Anal sex.  Riding a bike for a long period of time. What are the signs or symptoms? Symptoms of this condition include:  Pain.  Itching or soreness in the butt.  Bleeding from the butt.  Leaking poop.  Swelling in the area.  One or more lumps around the opening of your butt. How is this diagnosed? A doctor can often diagnose this condition by looking at the affected area. The doctor may also:  Do an exam that involves feeling the area with a gloved hand (digital rectal exam).  Examine the area inside your butt using a small tube (anoscope).  Order blood tests. This may be done if you have lost a lot of blood.  Have you get a test that involves looking inside the colon using a flexible tube with a camera on the end (sigmoidoscopy or colonoscopy). How is this treated? This condition  can usually be treated at home. Your doctor may tell you to change what you eat, make lifestyle changes, or try home treatments. If these do not help, procedures can be done to remove the hemorrhoids or make them smaller. These may involve:  Placing rubber bands at the base of the hemorrhoids to cut off their blood supply.  Injecting medicine into the hemorrhoids to shrink them.  Shining a type of light energy onto the hemorrhoids to cause them to fall off.  Doing surgery to remove the hemorrhoids or cut off their blood supply. Follow these instructions at home: Eating and drinking   Eat foods that have a lot of fiber in them. These include whole grains, beans, nuts, fruits, and vegetables.  Ask your doctor about taking products that have added fiber (fibersupplements).  Reduce the amount of fat in your diet. You can do this by: ? Eating low-fat dairy products. ? Eating less red meat. ? Avoiding processed foods.  Drink enough fluid to keep your pee (urine) pale yellow. Managing pain and swelling   Take a warm-water bath (sitz bath) for 20 minutes to ease pain. Do this 3-4 times a day. You may do this in a bathtub or using a portable sitz bath that fits over the toilet.  If told, put ice on  the painful area. It may be helpful to use ice between your warm baths. ? Put ice in a plastic bag. ? Place a towel between your skin and the bag. ? Leave the ice on for 20 minutes, 2-3 times a day. General instructions  Take over-the-counter and prescription medicines only as told by your doctor. ? Medicated creams and medicines may be used as told.  Exercise often. Ask your doctor how much and what kind of exercise is best for you.  Go to the bathroom when you have the urge to poop. Do not wait.  Avoid pushing too hard when you poop.  Keep your butt dry and clean. Use wet toilet paper or moist towelettes after pooping.  Do not sit on the toilet for a long time.  Keep all follow-up  visits as told by your doctor. This is important. Contact a doctor if you:  Have pain and swelling that do not get better with treatment or medicine.  Have trouble pooping.  Cannot poop.  Have pain or swelling outside the area of the hemorrhoids. Get help right away if you have:  Bleeding that will not stop. Summary  Hemorrhoids are swollen veins in the butt or around the opening of the butt.  They can cause pain, itching, or bleeding.  Eat foods that have a lot of fiber in them. These include whole grains, beans, nuts, fruits, and vegetables.  Take a warm-water bath (sitz bath) for 20 minutes to ease pain. Do this 3-4 times a day. This information is not intended to replace advice given to you by your health care provider. Make sure you discuss any questions you have with your health care provider. Document Released: 06/23/2008 Document Revised: 09/22/2018 Document Reviewed: 02/03/2018 Elsevier Patient Education  2020 [employer].

## 2019-07-16 NOTE — Discharge Instructions (Signed)
Braxton Hicks Contractions Contractions of the uterus can occur throughout pregnancy, but they are not always a sign that you are in labor. You may have practice contractions called Braxton Hicks contractions. These false labor contractions are sometimes confused with true labor. What are Braxton Hicks contractions? Braxton Hicks contractions are tightening movements that occur in the muscles of the uterus before labor. Unlike true labor contractions, these contractions do not result in opening (dilation) and thinning of the cervix. Toward the end of pregnancy (32-34 weeks), Braxton Hicks contractions can happen more often and may become stronger. These contractions are sometimes difficult to tell apart from true labor because they can be very uncomfortable. You should not feel embarrassed if you go to the hospital with false labor. Sometimes, the only way to tell if you are in true labor is for your health care provider to look for changes in the cervix. The health care provider will do a physical exam and may monitor your contractions. If you are not in true labor, the exam should show that your cervix is not dilating and your water has not broken. If there are no other health problems associated with your pregnancy, it is completely safe for you to be sent home with false labor. You may continue to have Braxton Hicks contractions until you go into true labor. How to tell the difference between true labor and false labor True labor  Contractions last 30-70 seconds.  Contractions become very regular.  Discomfort is usually felt in the top of the uterus, and it spreads to the lower abdomen and low back.  Contractions do not go away with walking.  Contractions usually become more intense and increase in frequency.  The cervix dilates and gets thinner. False labor  Contractions are usually shorter and not as strong as true labor contractions.  Contractions are usually irregular.  Contractions  are often felt in the front of the lower abdomen and in the groin.  Contractions may go away when you walk around or change positions while lying down.  Contractions get weaker and are shorter-lasting as time goes on.  The cervix usually does not dilate or become thin. Follow these instructions at home:   Take over-the-counter and prescription medicines only as told by your health care provider.  Keep up with your usual exercises and follow other instructions from your health care provider.  Eat and drink lightly if you think you are going into labor.  If Braxton Hicks contractions are making you uncomfortable: ? Change your position from lying down or resting to walking, or change from walking to resting. ? Sit and rest in a tub of warm water. ? Drink enough fluid to keep your urine pale yellow. Dehydration may cause these contractions. ? Do slow and deep breathing several times an hour.  Keep all follow-up prenatal visits as told by your health care provider. This is important. Contact a health care provider if:  You have a fever.  You have continuous pain in your abdomen. Get help right away if:  Your contractions become stronger, more regular, and closer together.  You have fluid leaking or gushing from your vagina.  You pass blood-tinged mucus (bloody show).  You have bleeding from your vagina.  You have low back pain that you never had before.  You feel your baby's head pushing down and causing pelvic pressure.  Your baby is not moving inside you as much as it used to. Summary  Contractions that occur before labor are   called Braxton Hicks contractions, false labor, or practice contractions.  Braxton Hicks contractions are usually shorter, weaker, farther apart, and less regular than true labor contractions. True labor contractions usually become progressively stronger and regular, and they become more frequent.  Manage discomfort from Braxton Hicks contractions  by changing position, resting in a warm bath, drinking plenty of water, or practicing deep breathing. This information is not intended to replace advice given to you by your health care provider. Make sure you discuss any questions you have with your health care provider. Document Released: 01/28/2017 Document Revised: 08/27/2017 Document Reviewed: 01/28/2017 Elsevier Patient Education  2020 Elsevier Inc.  

## 2019-07-16 NOTE — MAU Provider Note (Signed)
S: Ms. Brittany Fritz is a 33 y.o. [phone] at [redacted]w[redacted]d  who presents to MAU today complaining contractions q 10-15 minutes since 1500. She denies vaginal bleeding. She denies LOF. She reports normal fetal movement.    O: BP 113/71   Pulse 84   Temp 97.6 F (36.4 C) (Oral)   Resp 18   LMP 12/17/2018 (LMP Unknown)   SpO2 100%  GENERAL: Well-developed, well-nourished female in no acute distress.  HEAD: Normocephalic, atraumatic.  CHEST: Normal effort of breathing, regular heart rate ABDOMEN: Soft, nontender, gravid  Cervical exam:  Dilation: 5 Effacement (%): 60, 70 Cervical Position: Posterior Station: -2 Presentation: Vertex Exam by:: Gilmer Mor RN   Fetal Monitoring: Baseline: 120 Variability: moderate Accelerations: 15x15 Decelerations: none Contractions: occasional uc's   A: SIUP at [redacted]w[redacted]d  False labor  P: Discharge home Patient to follow up at [facility] as scheduled for prenatal care Patient may return to MAU as needed  Wende Mott, [place] 07/16/2019 7:49 PM

## 2019-07-16 NOTE — MAU Note (Signed)
Pt reports to mau, reporting irregular ctx and pressure for the past few hours, pt denies LOF, reports some spotting today.  Pt reports good fm.  Also complains of headache.

## 2019-07-17 ENCOUNTER — Telehealth: Payer: Self-pay

## 2019-07-17 ENCOUNTER — Other Ambulatory Visit: Payer: Self-pay

## 2019-07-17 ENCOUNTER — Inpatient Hospital Stay (HOSPITAL_COMMUNITY)
Admission: AD | Admit: 2019-07-17 | Discharge: 2019-07-17 | Disposition: A | Discharge status: Home / Self Care | Payer: Medicaid Other | Attending: Obstetrics & Gynecology | Admitting: Obstetrics & Gynecology

## 2019-07-17 DIAGNOSIS — Z3A36 36 weeks gestation of pregnancy: Secondary | ICD-10-CM | POA: Insufficient documentation

## 2019-07-17 DIAGNOSIS — O479 False labor, unspecified: Secondary | ICD-10-CM

## 2019-07-17 DIAGNOSIS — O4703 False labor before 37 completed weeks of gestation, third trimester: Secondary | ICD-10-CM | POA: Insufficient documentation

## 2019-07-17 NOTE — Discharge Instructions (Signed)
Braxton Hicks Contractions Contractions of the uterus can occur throughout pregnancy, but they are not always a sign that you are in labor. You may have practice contractions called Braxton Hicks contractions. These false labor contractions are sometimes confused with true labor. What are Braxton Hicks contractions? Braxton Hicks contractions are tightening movements that occur in the muscles of the uterus before labor. Unlike true labor contractions, these contractions do not result in opening (dilation) and thinning of the cervix. Toward the end of pregnancy (32-34 weeks), Braxton Hicks contractions can happen more often and may become stronger. These contractions are sometimes difficult to tell apart from true labor because they can be very uncomfortable. You should not feel embarrassed if you go to the hospital with false labor. Sometimes, the only way to tell if you are in true labor is for your health care provider to look for changes in the cervix. The health care provider will do a physical exam and may monitor your contractions. If you are not in true labor, the exam should show that your cervix is not dilating and your water has not broken. If there are no other health problems associated with your pregnancy, it is completely safe for you to be sent home with false labor. You may continue to have Braxton Hicks contractions until you go into true labor. How to tell the difference between true labor and false labor True labor  Contractions last 30-70 seconds.  Contractions become very regular.  Discomfort is usually felt in the top of the uterus, and it spreads to the lower abdomen and low back.  Contractions do not go away with walking.  Contractions usually become more intense and increase in frequency.  The cervix dilates and gets thinner. False labor  Contractions are usually shorter and not as strong as true labor contractions.  Contractions are usually irregular.  Contractions  are often felt in the front of the lower abdomen and in the groin.  Contractions may go away when you walk around or change positions while lying down.  Contractions get weaker and are shorter-lasting as time goes on.  The cervix usually does not dilate or become thin. Follow these instructions at home:   Take over-the-counter and prescription medicines only as told by your health care provider.  Keep up with your usual exercises and follow other instructions from your health care provider.  Eat and drink lightly if you think you are going into labor.  If Braxton Hicks contractions are making you uncomfortable: ? Change your position from lying down or resting to walking, or change from walking to resting. ? Sit and rest in a tub of warm water. ? Drink enough fluid to keep your urine pale yellow. Dehydration may cause these contractions. ? Do slow and deep breathing several times an hour.  Keep all follow-up prenatal visits as told by your health care provider. This is important. Contact a health care provider if:  You have a fever.  You have continuous pain in your abdomen. Get help right away if:  Your contractions become stronger, more regular, and closer together.  You have fluid leaking or gushing from your vagina.  You pass blood-tinged mucus (bloody show).  You have bleeding from your vagina.  You have low back pain that you never had before.  You feel your baby's head pushing down and causing pelvic pressure.  Your baby is not moving inside you as much as it used to. Summary  Contractions that occur before labor are   called Braxton Hicks contractions, false labor, or practice contractions.  Braxton Hicks contractions are usually shorter, weaker, farther apart, and less regular than true labor contractions. True labor contractions usually become progressively stronger and regular, and they become more frequent.  Manage discomfort from Braxton Hicks contractions  by changing position, resting in a warm bath, drinking plenty of water, or practicing deep breathing. This information is not intended to replace advice given to you by your health care provider. Make sure you discuss any questions you have with your health care provider. Document Released: 01/28/2017 Document Revised: 08/27/2017 Document Reviewed: 01/28/2017 Elsevier Patient Education  2020 Elsevier Inc.  

## 2019-07-17 NOTE — Telephone Encounter (Signed)
Called patient to advise her that Brittany Fritz sent Vit D to the pharmacy for her to pick up due to low Vit D levels. Pt reports that she is having contractions and leaking of fluid. Pt reports good fetal movement. I advised pt to go to the hospital for evaluation. Pt verbalizes understanding.

## 2019-07-17 NOTE — MAU Note (Signed)
Reports having contractions and a lot of pressure.  Also has a hemorrhoid that is painful.  Reports a small amount of vaginal bleeding that started this afternoon.  + FM.

## 2019-07-25 ENCOUNTER — Other Ambulatory Visit (HOSPITAL_COMMUNITY)
Admission: RE | Admit: 2019-07-25 | Discharge: 2019-07-25 | Disposition: A | Discharge status: Home / Self Care | Payer: Medicaid Other | Source: Ambulatory Visit | Attending: Advanced Practice Midwife | Admitting: Advanced Practice Midwife

## 2019-07-25 ENCOUNTER — Ambulatory Visit (INDEPENDENT_AMBULATORY_CARE_PROVIDER_SITE_OTHER): Payer: Medicaid Other | Admitting: Advanced Practice Midwife

## 2019-07-25 ENCOUNTER — Other Ambulatory Visit: Payer: Self-pay

## 2019-07-25 VITALS — BP 121/75 | HR 85 | Wt 224.0 lb

## 2019-07-25 DIAGNOSIS — Z3403 Encounter for supervision of normal first pregnancy, third trimester: Secondary | ICD-10-CM | POA: Insufficient documentation

## 2019-07-25 DIAGNOSIS — O26893 Other specified pregnancy related conditions, third trimester: Secondary | ICD-10-CM | POA: Diagnosis not present

## 2019-07-25 DIAGNOSIS — O368130 Decreased fetal movements, third trimester, not applicable or unspecified: Secondary | ICD-10-CM | POA: Diagnosis not present

## 2019-07-25 DIAGNOSIS — O24410 Gestational diabetes mellitus in pregnancy, diet controlled: Secondary | ICD-10-CM | POA: Diagnosis not present

## 2019-07-25 DIAGNOSIS — G479 Sleep disorder, unspecified: Secondary | ICD-10-CM

## 2019-07-25 DIAGNOSIS — R102 Pelvic and perineal pain: Secondary | ICD-10-CM

## 2019-07-25 DIAGNOSIS — Z3A37 37 weeks gestation of pregnancy: Secondary | ICD-10-CM | POA: Diagnosis not present

## 2019-07-25 MED ORDER — CYCLOBENZAPRINE HCL 10 MG PO TABS: 5.0000 mg | ORAL_TABLET | Freq: Every evening | ORAL | 1 refills | Status: DC | PRN

## 2019-07-25 NOTE — Progress Notes (Signed)
PRENATAL VISIT NOTE  Subjective:  Brittany Fritz is a 33 y.o. [phone] at [redacted]w[redacted]d being seen today for ongoing prenatal care.  She is currently monitored for the following issues for this low-risk pregnancy and has Immigrant with language difficulty; History of gestational diabetes mellitus (GDM) in prior pregnancy, currently pregnant; Supervision of other normal pregnancy, antepartum; Maternal obesity, antepartum; GBS bacteriuria; Gestational diabetes mellitus; and Hemorrhoids during pregnancy on their problem list.  Patient reports pelvic pressure, irregular contractions, and decreased fetal movement.  Contractions: Irregular. Vag. Bleeding: Scant.  Movement: Present. Denies leaking of fluid.   The following portions of the patient's history were reviewed and updated as appropriate: allergies, current medications, past family history, past medical history, past social history, past surgical history and problem list.   Objective:   Vitals:   07/25/19 0828  BP: 121/75  Pulse: 85  Weight: 224 lb (101.6 kg)    Fetal Status: Fetal Heart Rate (bpm): 140   Movement: Present  Presentation: Vertex  General:  Alert, oriented and cooperative. Patient is in no acute distress.  Skin: Skin is warm and dry. No rash noted.   Cardiovascular: Normal heart rate noted  Respiratory: Normal respiratory effort, no problems with respiration noted  Abdomen: Soft, gravid, appropriate for gestational age.  Pain/Pressure: Present     Pelvic: Cervical exam performed Dilation: 5 Effacement (%): 70 Station: -2  Extremities: Normal range of motion.  Edema: Trace  Mental Status: Normal mood and affect. Normal behavior. Normal judgment and thought content.   Assessment and Plan:  Pregnancy: [id number] at [redacted]w[redacted]d 1. Encounter for supervision of normal first pregnancy in third trimester --Anticipatory guidance about next visits/weeks of pregnancy given. --Pt with advanced dilation at 5 cm but no regular contractions.   Labor precautions reviewed. --Encouraged to try the [place] to improve fetal position and encourage onset of labor - Strep Gp B NAA - Cervicovaginal ancillary only( [place])  2. Decreased fetal movements in third trimester, single or unspecified fetus --Initially NST was not reactive but pt reports she has not eaten today. After some juice and a snack, NST reactive with multiple 15 x 15 accels in next 15 minutes of tracing. --See below under GDM for follow up plan  3. Diet controlled gestational diabetes mellitus (GDM) in third trimester --Pt reports fasting glucose all 90-100, with more than half above 95, PP all below 120.   --Counseled about dietary changes, less carbs at evening meal, protein snack at bedtime. Pt agrees to make changes.  --Consult Dr Rip Harbour, plan to schedule BPP this week, and close f/u in office, no meds to start today.  4. Pelvic pain affecting pregnancy in third trimester, antepartum --Pt unable to sleep, pain with rolling over in bed.  Pt is wearing support belt, trying Tylenol, drinking plenty of fluids.  Fetal position is low, causing pressure but some pain is bilateral round ligament and low back pain so will try Flexeril.   --Rx for Flexeril 5-10 mg Q HS sent to pharmacy  5. Sleep disturbance   Term labor symptoms and general obstetric precautions including but not limited to vaginal bleeding, contractions, leaking of fluid and fetal movement were reviewed in detail with the patient. Please refer to After Visit Summary for other counseling recommendations.   No follow-ups on file.  Future Appointments  Date Time Provider [place]  07/28/2019  9:00 AM [place] None  07/31/2019  2:55 PM Leftwich-Kirby, Kathie Dike, CNM [facility] None  08/01/2019 11:30 AM  [facility] NURSE [facility]  08/01/2019 11:30 AM [facility] [place] 5 [facility]    Sharen Counter, CNM

## 2019-07-25 NOTE — Progress Notes (Addendum)
ROB/GBS.  C/o pain in hands and legs and numbness also hemorrhoids.  NST done today.

## 2019-07-25 NOTE — Patient Instructions (Signed)
Reasons to return to MAU at [facility]:  1.  Contractions are  5 minutes apart or less, each last 1 minute, these have been going on for 1-2 hours, and you cannot walk or talk during them 2.  You have a large gush of fluid, or a trickle of fluid that will not stop and you have to wear a pad 3.  You have bleeding that is bright red, heavier than spotting--like menstrual bleeding (spotting can be normal in early labor or after a check of your cervix) 4.  You do not feel the baby moving like he/she normally does  

## 2019-07-27 ENCOUNTER — Other Ambulatory Visit: Payer: Self-pay

## 2019-07-27 ENCOUNTER — Inpatient Hospital Stay (HOSPITAL_COMMUNITY)
Admission: AD | Admit: 2019-07-27 | Discharge: 2019-07-29 | DRG: 807 | Disposition: A | Discharge status: Home / Self Care | Payer: Medicaid Other | Attending: Obstetrics & Gynecology | Admitting: Obstetrics & Gynecology

## 2019-07-27 ENCOUNTER — Encounter (HOSPITAL_COMMUNITY): Payer: Self-pay

## 2019-07-27 ENCOUNTER — Telehealth: Payer: Self-pay

## 2019-07-27 DIAGNOSIS — Z20828 Contact with and (suspected) exposure to other viral communicable diseases: Secondary | ICD-10-CM | POA: Diagnosis present

## 2019-07-27 DIAGNOSIS — O24420 Gestational diabetes mellitus in childbirth, diet controlled: Secondary | ICD-10-CM | POA: Diagnosis present

## 2019-07-27 DIAGNOSIS — Z3A37 37 weeks gestation of pregnancy: Secondary | ICD-10-CM

## 2019-07-27 DIAGNOSIS — O24419 Gestational diabetes mellitus in pregnancy, unspecified control: Secondary | ICD-10-CM | POA: Diagnosis present

## 2019-07-27 DIAGNOSIS — Z880 Allergy status to penicillin: Secondary | ICD-10-CM

## 2019-07-27 DIAGNOSIS — O99824 Streptococcus B carrier state complicating childbirth: Secondary | ICD-10-CM | POA: Diagnosis present

## 2019-07-27 DIAGNOSIS — O09299 Supervision of pregnancy with other poor reproductive or obstetric history, unspecified trimester: Secondary | ICD-10-CM

## 2019-07-27 DIAGNOSIS — O26893 Other specified pregnancy related conditions, third trimester: Secondary | ICD-10-CM | POA: Diagnosis present

## 2019-07-27 DIAGNOSIS — O99210 Obesity complicating pregnancy, unspecified trimester: Secondary | ICD-10-CM

## 2019-07-27 DIAGNOSIS — O4693 Antepartum hemorrhage, unspecified, third trimester: Secondary | ICD-10-CM | POA: Diagnosis present

## 2019-07-27 DIAGNOSIS — Z8632 Personal history of gestational diabetes: Secondary | ICD-10-CM

## 2019-07-27 DIAGNOSIS — Z3480 Encounter for supervision of other normal pregnancy, unspecified trimester: Secondary | ICD-10-CM

## 2019-07-27 DIAGNOSIS — R8271 Bacteriuria: Secondary | ICD-10-CM

## 2019-07-27 LAB — URINALYSIS, ROUTINE W REFLEX MICROSCOPIC
Bilirubin Urine: NEGATIVE
Glucose, UA: NEGATIVE mg/dL
Ketones, ur: NEGATIVE mg/dL
Nitrite: NEGATIVE
Protein, ur: NEGATIVE mg/dL
Specific Gravity, Urine: 1.009 (ref 1.005–1.030)
Squamous Epithelial / HPF: >50 — ABNORMAL HIGH (ref 0–5)
pH: 7 (ref 5.0–8.0)

## 2019-07-27 LAB — CBC
HCT: 33.1 % — ABNORMAL LOW (ref 36.0–46.0)
Hemoglobin: 10 g/dL — ABNORMAL LOW (ref 12.0–15.0)
MCH: 22.5 pg — ABNORMAL LOW (ref 26.0–34.0)
MCHC: 30.2 g/dL (ref 30.0–36.0)
MCV: 74.4 fL — ABNORMAL LOW (ref 80.0–100.0)
Platelets: 137 10*3/uL — ABNORMAL LOW (ref 150–400)
RBC: 4.45 MIL/uL (ref 3.87–5.11)
RDW: 16.7 % — ABNORMAL HIGH (ref 11.5–15.5)
WBC: 8.2 10*3/uL (ref 4.0–10.5)
nRBC: 0 % (ref 0.0–0.2)

## 2019-07-27 LAB — TYPE AND SCREEN
ABO/RH(D): B POS
Antibody Screen: NEGATIVE

## 2019-07-27 LAB — GLUCOSE, CAPILLARY: Glucose-Capillary: 72 mg/dL (ref 70–99)

## 2019-07-27 LAB — SARS CORONAVIRUS 2 BY RT PCR (HOSPITAL ORDER, PERFORMED IN ~~LOC~~ HOSPITAL LAB): SARS Coronavirus 2: NEGATIVE

## 2019-07-27 LAB — STREP GP B NAA: Strep Gp B NAA: POSITIVE — AB

## 2019-07-27 LAB — ABO/RH: ABO/RH(D): B POS

## 2019-07-27 MED ORDER — SIMETHICONE 80 MG PO CHEW: 80.0000 mg | CHEWABLE_TABLET | ORAL | Status: DC | PRN

## 2019-07-27 MED ORDER — OXYCODONE-ACETAMINOPHEN 5-325 MG PO TABS: 2.0000 | ORAL_TABLET | ORAL | Status: DC | PRN

## 2019-07-27 MED ORDER — BENZOCAINE-MENTHOL 20-0.5 % EX AERO: 1.0000 "application " | INHALATION_SPRAY | CUTANEOUS | Status: DC | PRN

## 2019-07-27 MED ORDER — IBUPROFEN 600 MG PO TABS
600.0000 mg | ORAL_TABLET | Freq: Four times a day (QID) | ORAL | Status: DC
  Administered 2019-07-27 – 2019-07-29 (×8): 600 mg via ORAL
  Filled 2019-07-27 (×8): qty 1

## 2019-07-27 MED ORDER — TETANUS-DIPHTH-ACELL PERTUSSIS 5-2.5-18.5 LF-MCG/0.5 IM SUSP: 0.5000 mL | Freq: Once | INTRAMUSCULAR | Status: DC

## 2019-07-27 MED ORDER — SOD CITRATE-CITRIC ACID 500-334 MG/5ML PO SOLN: 30.0000 mL | ORAL | Status: DC | PRN

## 2019-07-27 MED ORDER — WITCH HAZEL-GLYCERIN EX PADS: 1.0000 "application " | MEDICATED_PAD | CUTANEOUS | Status: DC | PRN

## 2019-07-27 MED ORDER — ONDANSETRON HCL 4 MG PO TABS: 4.0000 mg | ORAL_TABLET | ORAL | Status: DC | PRN

## 2019-07-27 MED ORDER — LACTATED RINGERS IV SOLN: 500.0000 mL | INTRAVENOUS | Status: DC | PRN

## 2019-07-27 MED ORDER — ONDANSETRON HCL 4 MG/2ML IJ SOLN: 4.0000 mg | INTRAMUSCULAR | Status: DC | PRN

## 2019-07-27 MED ORDER — FENTANYL CITRATE (PF) 100 MCG/2ML IJ SOLN
100.0000 ug | INTRAMUSCULAR | Status: DC | PRN
  Administered 2019-07-27 (×3): 100 ug via INTRAVENOUS
  Filled 2019-07-27 (×3): qty 2

## 2019-07-27 MED ORDER — DIPHENHYDRAMINE HCL 25 MG PO CAPS: 25.0000 mg | ORAL_CAPSULE | Freq: Four times a day (QID) | ORAL | Status: DC | PRN

## 2019-07-27 MED ORDER — OXYTOCIN BOLUS FROM INFUSION
500.0000 mL | Freq: Once | INTRAVENOUS | Status: AC
  Administered 2019-07-27: 18:00:00 500 mL via INTRAVENOUS

## 2019-07-27 MED ORDER — ACETAMINOPHEN 325 MG PO TABS
650.0000 mg | ORAL_TABLET | ORAL | Status: DC | PRN
  Administered 2019-07-27: 20:00:00 650 mg via ORAL
  Filled 2019-07-27: qty 2

## 2019-07-27 MED ORDER — OXYCODONE HCL 5 MG PO TABS: 5.0000 mg | ORAL_TABLET | ORAL | Status: DC | PRN

## 2019-07-27 MED ORDER — ZOLPIDEM TARTRATE 5 MG PO TABS: 5.0000 mg | ORAL_TABLET | Freq: Every evening | ORAL | Status: DC | PRN

## 2019-07-27 MED ORDER — SENNOSIDES-DOCUSATE SODIUM 8.6-50 MG PO TABS
2.0000 | ORAL_TABLET | ORAL | Status: DC
  Administered 2019-07-27 – 2019-07-29 (×2): 2 via ORAL
  Filled 2019-07-27 (×2): qty 2

## 2019-07-27 MED ORDER — OXYCODONE HCL 5 MG PO TABS
10.0000 mg | ORAL_TABLET | ORAL | Status: DC | PRN
  Administered 2019-07-28: 15:00:00 10 mg via ORAL
  Filled 2019-07-27: qty 2

## 2019-07-27 MED ORDER — COCONUT OIL OIL: 1.0000 "application " | TOPICAL_OIL | Status: DC | PRN

## 2019-07-27 MED ORDER — LACTATED RINGERS IV SOLN
INTRAVENOUS | Status: DC
  Administered 2019-07-27 (×3): via INTRAVENOUS

## 2019-07-27 MED ORDER — DIBUCAINE (PERIANAL) 1 % EX OINT: 1.0000 "application " | TOPICAL_OINTMENT | CUTANEOUS | Status: DC | PRN

## 2019-07-27 MED ORDER — OXYTOCIN 40 UNITS IN NORMAL SALINE INFUSION - SIMPLE MED
2.5000 [IU]/h | INTRAVENOUS | Status: DC
  Filled 2019-07-27 (×2): qty 1000

## 2019-07-27 MED ORDER — VANCOMYCIN HCL IN DEXTROSE 1-5 GM/200ML-% IV SOLN
1000.0000 mg | Freq: Two times a day (BID) | INTRAVENOUS | Status: DC
  Administered 2019-07-27: 16:00:00 1000 mg via INTRAVENOUS
  Filled 2019-07-27: qty 200

## 2019-07-27 MED ORDER — LIDOCAINE HCL (PF) 1 % IJ SOLN
30.0000 mL | INTRAMUSCULAR | Status: AC | PRN
  Administered 2019-07-27: 30 mL via SUBCUTANEOUS
  Filled 2019-07-27: qty 30

## 2019-07-27 MED ORDER — PRENATAL MULTIVITAMIN CH
1.0000 | ORAL_TABLET | Freq: Every day | ORAL | Status: DC
  Administered 2019-07-28 – 2019-07-29 (×2): 1 via ORAL
  Filled 2019-07-27 (×2): qty 1

## 2019-07-27 MED ORDER — OXYCODONE-ACETAMINOPHEN 5-325 MG PO TABS: 1.0000 | ORAL_TABLET | ORAL | Status: DC | PRN

## 2019-07-27 MED ORDER — ACETAMINOPHEN 325 MG PO TABS
650.0000 mg | ORAL_TABLET | ORAL | Status: DC | PRN
  Administered 2019-07-28 (×2): 650 mg via ORAL
  Filled 2019-07-27 (×2): qty 2

## 2019-07-27 MED ORDER — ONDANSETRON HCL 4 MG/2ML IJ SOLN: 4.0000 mg | Freq: Four times a day (QID) | INTRAMUSCULAR | Status: DC | PRN

## 2019-07-27 NOTE — MAU Note (Signed)
.  Brittany Fritz is a 33 y.o. at [redacted]w[redacted]d here in MAU reporting: VB like a period since 0900. Called [name] and they instructed her to come in for labor evaluation. No LOF. Patient reports decreased fetal movement Pain score: 10   FHT:140

## 2019-07-27 NOTE — Progress Notes (Signed)
Brittany Fritz is a 33 y.o. [id number] at [redacted]w[redacted]d by LMP admitted for vaginal bleeding and early labor.  Subjective: Pt reports painful contractions every 2-3 minutes, pain well managed with IV pain medications. Pt husband is at bedside for support.  Objective: BP 103/62   Pulse 88   Temp 98.8 F (37.1 C) (Oral)   Resp 16   Ht 5\' 4"  (1.626 m)   Wt 101.6 kg   LMP 12/17/2018 (LMP Unknown)   SpO2 97%   BMI 38.45 kg/m  No intake/output data recorded. No intake/output data recorded.  FHT:  FHR: 135 bpm, variability: moderate,  accelerations:  Present,  decelerations:  Absent UC:   regular, every 3 minutes SVE:   Dilation: 5.5 Effacement (%): 70 Station: -2 Exam by:: Lattie Haw, CNM AROM with clear fluid, pt tolerated well.   Labs: Lab Results  Component Value Date   WBC 8.2 07/27/2019   HGB 10.0 (L) 07/27/2019   HCT 33.1 (L) 07/27/2019   MCV 74.4 (L) 07/27/2019   PLT 137 (L) 07/27/2019    Assessment / Plan: Augmentation of labor, progressing well  Labor: Pt with slight cervical change since admission, continued bloody show, light bleeding, Category I FHR tracing.  Pt having regular painful contractions so discussed options to augment labor vs expectant management.  Pt desires augmentation at this time so AROM performed.  Consider IUPC and Pitocin if no cervical change in 2-3 hours.  Preeclampsia:  n/a Fetal Wellbeing:  Category I Pain Control:  IV pain meds I/D:  GBS positive, no sensitivities, on Vancomycin Q 12 Anticipated MOD:  NSVD  Fatima Blank 07/27/2019, 5:22 PM

## 2019-07-27 NOTE — Lactation Note (Addendum)
This note was copied from a baby's chart. Lactation Consultation Note  Patient Name: Brittany Fritz [id number] Date: 07/27/2019 Reason for consult: Initial assessment;Early term 37-38.6wks;Maternal endocrine disorder Type of Endocrine Disorder?: Diabetes P4, 5 hour ETI infant. Mom's feeding choice at admission was breast and formula feeding. Mom is active on the [facility] program in [facility], she doesn't have a breast pump at home. Mom was given a harmony hand pump and fitted with 27 mm breast flange. Mom shown how to use harmony hand pump  & how to disassemble, clean, & reassemble parts. Per Night Nurse , parents do not want interpreter services,  Dad is fluent in [place] and mom speaks [place].  Per  parents,  this is infant first time latching to breast they made one attempt earlier. Mom breastfed her first child for 4 months and her 6nd and 19rd child for 3 months each, her 69rd child is 83/54 years old.  Infant had one void diaper since birth. LC entered room formula was on table.  Mom taught back hand expression and infant was given 2 ml of colostrum by spoon. Mom wanted help with latching infant at breast, mom latched infant on right breast using the football hold position, LC help reposition mom's hand to U hold instead of scissor hold, asked mom to wait until infant mouth is wide, tongue down bringing infant to breast chin first. Infant latched with top lip flange out, nose and chin touching breast, swallows were heard. Infant was still breastfeeding after 23 minutes, per mom, she felt only a tug but no pain with latch. Mom 's plan is always to latch infant first to breast and will delay formula usage for now. Mom know to call Nurse or [place] if she has any questions, concerns or needs assistance with latching infant to breast. Mom knows to breastfeed infant according to hunger cues, 8 to 12 times within 24 hours and on demand. Mom made aware of O/P services, breastfeeding support groups,  community resources, and our phone # for post-discharge questions.    Maternal Data Formula Feeding for Exclusion: Yes Reason for exclusion: Mother's choice to formula and breast feed on admission Has patient been taught Hand Expression?: Yes(MOm taught back infant given 2 ml of colsotrum.) Does the patient have breastfeeding experience prior to this delivery?: Yes  Feeding Feeding Type: Breast Fed Nipple Type: Slow - flow  LATCH Score Latch: Grasps breast easily, tongue down, lips flanged, rhythmical sucking.  Audible Swallowing: Spontaneous and intermittent  Type of Nipple: Everted at rest and after stimulation  Comfort (Breast/Nipple): Soft / non-tender  Hold (Positioning): Assistance needed to correctly position infant at breast and maintain latch.  LATCH Score: 9  Interventions Interventions: Breast feeding basics reviewed;Breast compression;Assisted with latch;Adjust position;Hand pump;Skin to skin;Support pillows;Breast massage;Position options;Hand express;Expressed milk  Lactation Tools Discussed/Used Tools: Pump Breast pump type: Manual WIC Program: Yes Pump Review: Setup, frequency, and cleaning;Milk Storage Initiated by:: Vicente Serene, IBCLC Date initiated:: 07/27/19   Consult Status Consult Status: Follow-up Date: 07/28/19 Follow-up type: In-patient    Vicente Serene 07/27/2019, 11:55 PM

## 2019-07-27 NOTE — Telephone Encounter (Signed)
Patient called in saying that she is having some contractions and bleeding heavy like a menstrual cycle. Advised pt to go to the hospital immediately, pt agreed.

## 2019-07-27 NOTE — Discharge Summary (Addendum)
Postpartum Discharge Summary     Patient Name: Brittany Fritz DOB: 24-May-1986 MRN: [id number]  Date of admission: 07/27/2019 Delivering Provider: Verita Schneiders A   Date of discharge: 07/29/2019  Admitting diagnosis: Bleeding Intrauterine pregnancy: [redacted]w[redacted]d    Secondary diagnosis:  Active Problems:   Gestational diabetes mellitus   Vaginal bleeding in pregnancy, third trimester   NSVD (normal spontaneous vaginal delivery)  Additional problems:  None     Discharge diagnosis: Term Pregnancy Delivered                                                                                                Post partum procedures: First degree perineal repair.   Augmentation: AROM Complications: None  Hospital course:  Onset of Labor With Vaginal Delivery     33y.o. yo [id number] admitted in Latent Labor on 07/27/2019. She presented at 5 cm dilated. She was augmented with AROM and progressed to complete. She received Vancomycin for GBS prophylaxis given penicillin allergy. Post partum blood glucose were appropriate.   Patient had an uncomplicated labor course as follows:  Membrane Rupture Time/Date: 5:16 PM ,07/27/2019   Intrapartum Procedures: Episiotomy: None [1]                                         Lacerations:  Perineal [11];1st degree [2]  Patient had a delivery of a Viable infant. 07/27/2019  Information for the patient's newborn:  AlRudene, Poulsen0[[id number]]Delivery Method: Vaginal, Spontaneous(Filed from Delivery Summary)     Pateint had an uncomplicated postpartum course.  She is ambulating, tolerating a regular diet, passing flatus, and urinating well. Patient is discharged home in stable condition on 07/29/19.  Delivery time: 6:17 PM    Magnesium Sulfate received: No BMZ received: No Rhophylac:No MMR:No Transfusion:No  Physical exam  Vitals:   07/28/19 0410 07/28/19 1600 07/28/19 2213 07/29/19 0600  BP: 107/62 108/74 (!) 111/58 117/74  Pulse: 94 85  78 85  Resp: 18 18 18 18   Temp: 98.1 F (36.7 C) 98.2 F (36.8 C) 98.4 F (36.9 C) 98.6 F (37 C)  TempSrc: Oral Oral Oral Oral  SpO2: 99%   99%  Weight:      Height:       General: alert Lochia: appropriate Uterine Fundus: firm Incision: N/A DVT Evaluation: No evidence of DVT seen on physical exam. Labs: Lab Results  Component Value Date   WBC 8.2 07/27/2019   HGB 10.0 (L) 07/27/2019   HCT 33.1 (L) 07/27/2019   MCV 74.4 (L) 07/27/2019   PLT 137 (L) 07/27/2019   CMP Latest Ref Rng & Units 01/23/2016  Glucose 65 - 99 mg/dL 82  BUN 6 - 20 mg/dL 6  Creatinine 0.57 - 1.00 mg/dL 0.62  Sodium 134 - 144 mmol/L 141  Potassium 3.5 - 5.2 mmol/L 4.3  Chloride 96 - 106 mmol/L 97  CO2 18 - 29 mmol/L 24  Calcium 8.7 - 10.2 mg/dL 9.2  Total Protein 6.0 -  8.5 g/dL 7.4  Total Bilirubin 0.0 - 1.2 mg/dL 0.3  Alkaline Phos 39 - 117 IU/L 102  AST 0 - 40 IU/L 15  ALT 0 - 32 IU/L 12    Discharge instruction: per After Visit Summary and "Baby and Me Booklet".  After visit meds:    Diet: routine diet  Activity: Advance as tolerated. Pelvic rest for 6 weeks.   Outpatient follow up:6 weeks Follow up Appt: Future Appointments  Date Time Provider [place]  08/28/2019  9:15 AM Leftwich-Kirby, Kathie Dike, CNM [facility] None   Follow up Visit:   Please schedule this patient for Postpartum visit in: 6 weeks with the following provider: APP For C/S patients schedule nurse incision check in weeks 2 weeks: no Low risk pregnancy complicated by: GDM Delivery mode:  SVD Anticipated Birth Control:  other/unsure PP Procedures needed: none  Schedule Integrated BH visit: no      Newborn Data: Live born female  Birth Weight: 8 lb 1.5 oz (3670 g) APGAR: 9, 9  Newborn Delivery   Birth date/time: 07/27/2019 18:17:00 Delivery type: Vaginal, Spontaneous      Baby Feeding: Breast Disposition:rooming in   07/29/2019 Alexis Frock, MD  GME ATTESTATION:  I saw and evaluated  the patient. I agree with the findings and the plan of care as documented in the resident's note.  Merilyn Baba, DO OB Fellow, Faculty [facility] 07/29/2019 9:52 AM

## 2019-07-27 NOTE — H&P (Signed)
Brittany Fritz is a 33 y.o. female [phone] at [redacted]w[redacted]d presenting for early labor and vaginal bleeding.  She was admitted in early labor with light vaginal bleeding and Category I FHR.     Nursing Staff Provider  Office Location  [name] Dating  LMP  Language   ARABIC Anatomy US  Incomplete, fU scheduled for 9/4  Flu Vaccine   Declined 07/11/19 Genetic Screen  NIPS: low risks  AFP:  Negative (corrected report)  TDaP vaccine   Declined 07/11/19 Hgb A1C or  GTT Early A1C 5.4 Third trimester: ELEVATED  Rhogam   n/a   LAB RESULTS   Feeding Plan Both Blood Type B/Positive/-- (06/22 0905)   Contraception None Antibody Negative (06/22 0905)  Circumcision Yes Rubella 9.12 (06/22 0905)  Pediatrician  Dr. Jill Fritz, Pediatrician in [place] RPR Non Reactive (08/20 0900)   Support Person Husband HBsAg Negative (06/22 0905)   Prenatal Classes No HIV Non Reactive (08/20 0900)  BTL Consent  GBS  (Has PCN allergy, check sensitivities)   VBAC Consent  Pap  Neg (03/2017)    Hgb Electro  AA  BP Cuff Yes, at home CF neg    SMA  3 copies    Waterbirth  [ ]  Class [ ]  Consent [ ]  CNM visit   OB History    Gravida  4   Para  2   Term  2   Preterm      AB  1   Living  2     SAB  1   TAB      Ectopic      Multiple  0   Live Births  2          Past Medical History:  Diagnosis Date  . Allergy    Seasonal  . Gestational diabetes   . Medical history non-contributory    Past Surgical History:  Procedure Laterality Date  . NO PAST SURGERIES     Family History: family history includes Heart disease in her father; Stroke in her father. Social History:  reports that she has never smoked. She has never used smokeless tobacco. She reports that she does not drink alcohol or use drugs.     Maternal Diabetes: Yes:  Diabetes Type:  Diet controlled Genetic Screening: Normal Maternal Ultrasounds/Referrals: Normal Fetal Ultrasounds or other Referrals:  None Maternal Substance Abuse:   No Significant Maternal Medications:  None Significant Maternal Lab Results:  Group B Strep positive Other Comments:  None  Review of Systems  Constitutional: Negative for chills and fever.  Respiratory: Negative for shortness of breath.   Cardiovascular: Negative for chest pain.  Gastrointestinal: Positive for abdominal pain. Negative for constipation, diarrhea and vomiting.  Neurological: Negative for dizziness and headaches.  All other systems reviewed and are negative.  Maternal Medical History:  Reason for admission: Contractions and vaginal bleeding.   Contractions: Onset was 3-5 hours ago.   Frequency: regular.   Perceived severity is moderate.    Fetal activity: Perceived fetal activity is normal.   Last perceived fetal movement was within the past hour.    Prenatal complications: no prenatal complications Prenatal Complications - Diabetes: gestational. Diabetes is managed by diet.      Dilation: 5 Effacement (%): 70 Station: -2 Exam by:: , CNM Last menstrual period 12/17/2018, unknown if currently breastfeeding. Maternal Exam:  Uterine Assessment: Contraction strength is mild.  Contraction frequency is regular.   Abdomen: Fetal presentation: vertex  Pelvis: adequate for delivery.   Cervix:  Cervix evaluated by digital exam.     Fetal Exam Fetal Monitor Review: Mode: ultrasound.   Baseline rate: 135.  Variability: moderate (6-25 bpm).   Pattern: accelerations present and no decelerations.    Fetal State Assessment: Category I - tracings are normal.     Physical Exam  Nursing note and vitals reviewed. Constitutional: She is oriented to person, place, and time. She appears well-developed and well-nourished.  Neck: Normal range of motion.  Cardiovascular: Normal rate, regular rhythm and normal heart sounds.  Respiratory: Effort normal and breath sounds normal.  GI: Soft.  Musculoskeletal: Normal range of motion.  Neurological: She is alert and  oriented to person, place, and time.  Skin: Skin is warm and dry.  Psychiatric: She has a normal mood and affect. Her behavior is normal. Judgment and thought content normal.    Prenatal labs: ABO, Rh: B/Positive/-- (06/22 0905) Antibody: Negative (06/22 0905) Rubella: 9.12 (06/22 0905) RPR: Non Reactive (08/20 0900)  HBsAg: Negative (06/22 0905)  HIV: Non Reactive (08/20 0900)  GBS: --Brittany Fritz (10/27 0857)   Assessment/Plan: [id number] at [redacted]w[redacted]d with bloody show, regular contractions and advanced dilation at 5 cm GBS positive, no sensitivities  Admit to L&D Expectant management on admission, consider AROM as needed Vancomycin for GBS prophylaxis Anticipate NSVD    Brittany Fritz 07/27/2019, 2:28 PM

## 2019-07-28 ENCOUNTER — Ambulatory Visit (HOSPITAL_COMMUNITY): Payer: Medicaid Other

## 2019-07-28 ENCOUNTER — Encounter (HOSPITAL_COMMUNITY): Payer: Self-pay

## 2019-07-28 ENCOUNTER — Ambulatory Visit: Payer: Medicaid Other

## 2019-07-28 LAB — CERVICOVAGINAL ANCILLARY ONLY
Chlamydia: NEGATIVE
Comment: NEGATIVE
Comment: NORMAL
Neisseria Gonorrhea: NEGATIVE

## 2019-07-28 LAB — RPR: RPR Ser Ql: NONREACTIVE

## 2019-07-28 NOTE — Progress Notes (Signed)
Brittany Fritz  Post Partum Day 1:S/P SVD with Repair of 1st Degree Perineal Laceration  Subjective: Patient up ad lib, denies syncope or dizziness. Reports consuming regular diet without issues and denies N/V. Denies issues with urination and reports bleeding is "good."  Patient is breastfeeding and reports going better.  Desires no postpartum contraception.  Pain is being appropriately managed with use of ibuprofen.  Objective: Vitals:   07/27/19 2010 07/27/19 2128 07/28/19 0030 07/28/19 0410  BP: 122/72 106/61 110/63 107/62  Pulse: 74 91 94 94  Resp: 18 18 18 18   Temp: 98.4 F (36.9 C) 98.7 F (37.1 C) 98 F (36.7 C) 98.1 F (36.7 C)  TempSrc: Oral Oral Oral Oral  SpO2: 99%   99%  Weight:      Height:       Recent Labs    07/27/19 1431  HGB 10.0*  HCT 33.1*    Physical Exam:  General: alert and cooperative Mood/Affect: Appropriate/Bright Lungs: clear to auscultation, no wheezes, rales or rhonchi, symmetric air entry.  Heart: normal rate and regular rhythm. Breast: not examined. Abdomen:  + bowel sounds, No Tenderness Uterine Fundus: firm, U/-3 Lochia: appropriate Laceration: Not Assessed Skin: Warm, Dry DVT Evaluation: No significant calf/ankle edema.  Assessment S/P Vaginal Delivery-Day 1 Normal [employer] BreastFeeding No Contraception [id number]  Plan: Discussed plan for discharge tomorrow d/t late delivery. Encouraged ambulation and rest. No fasting CBG on file, will collect tomorrow. Continue current care  Maryann Conners, MSN, CNM 07/28/2019, 9:30 AM

## 2019-07-28 NOTE — Lactation Note (Signed)
This note was copied from a baby's chart. Lactation Consultation Note  Patient Name: Brittany Fritz [id number] Date: 07/28/2019 Reason for consult: Follow-up assessment  Mom and dad both resting when entered room.  Had Abby, [employer] employee shadowing. Parents report baby is feeding well.  Parents report they plan to do both bf and formula feeding.  Denied need for lactation services at this time.  Urged to call as needed. Maternal Data    Feeding Feeding Type: Bottle Fed - Formula Nipple Type: Slow - flow  LATCH Score                   Interventions    Lactation Tools Discussed/Used     Consult Status Consult Status: Complete Date: 07/28/19 Follow-up type: Call as needed    [facility] 07/28/2019, 7:40 PM

## 2019-07-29 LAB — GLUCOSE, CAPILLARY: Glucose-Capillary: 82 mg/dL (ref 70–99)

## 2019-07-29 MED ORDER — IBUPROFEN 600 MG PO TABS: 600.0000 mg | ORAL_TABLET | Freq: Four times a day (QID) | ORAL | 0 refills | Status: DC

## 2019-07-31 ENCOUNTER — Encounter: Payer: Medicaid Other | Admitting: Advanced Practice Midwife

## 2019-08-01 ENCOUNTER — Ambulatory Visit (HOSPITAL_COMMUNITY): Payer: Medicaid Other

## 2019-08-01 ENCOUNTER — Encounter (HOSPITAL_COMMUNITY): Payer: Self-pay

## 2019-08-28 ENCOUNTER — Ambulatory Visit: Payer: Medicaid Other | Admitting: Advanced Practice Midwife

## 2019-09-01 ENCOUNTER — Ambulatory Visit: Payer: Medicaid Other | Admitting: Medical

## 2019-12-10 IMAGING — US US MFM OB DETAIL +14 WK
1 series · 13 of 28 positions shown · non-contrast
Comparison: none

[Series 1: us mfm ob detail +14 wk · 97 acquisitions, 13 frames shown]
[im 4/97]
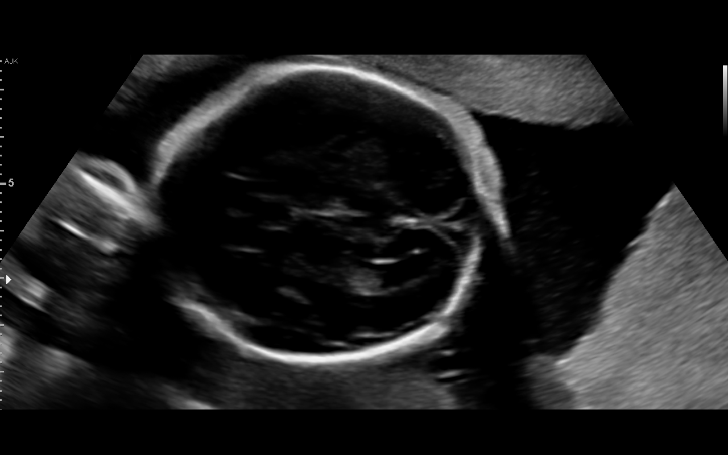
[im 11/97]
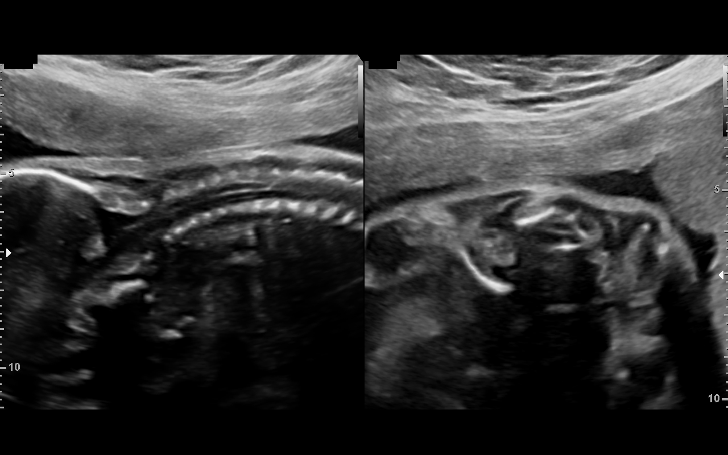
[im 18/97]
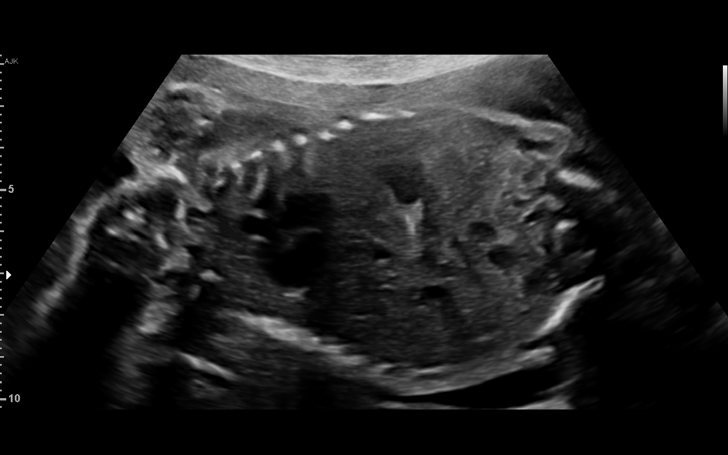
[im 25/97]
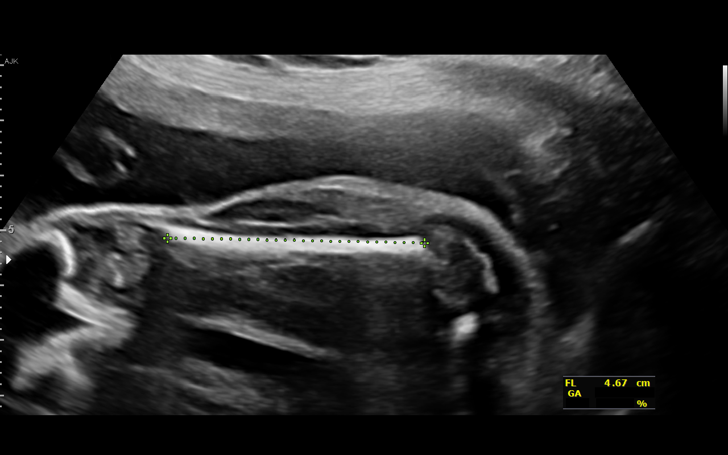
[im 33/97]
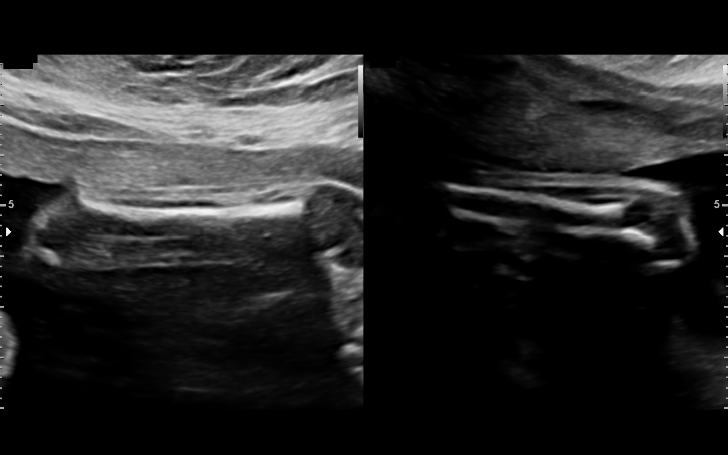
[im 40/97]
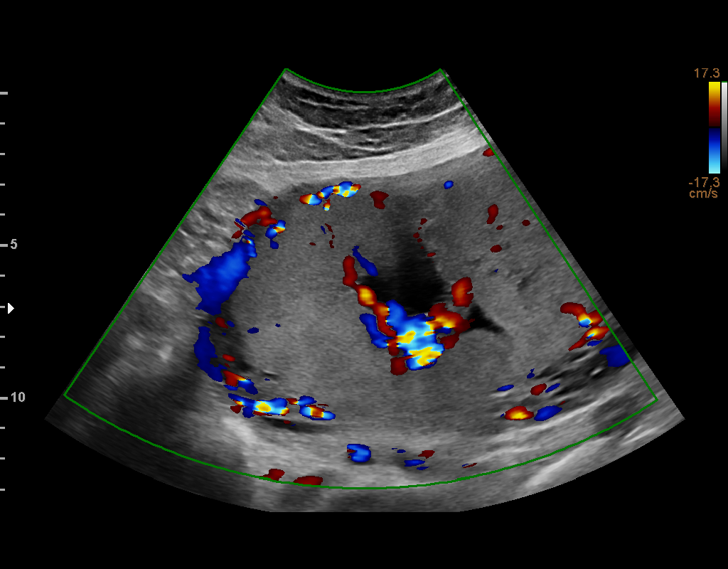
[im 50/97]
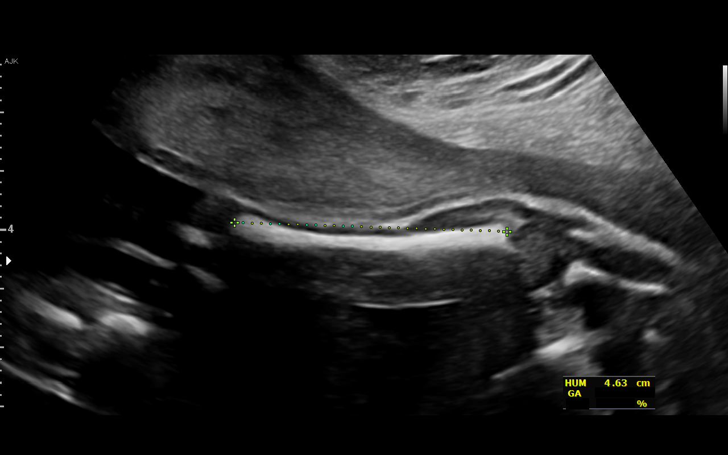
[im 57/97]
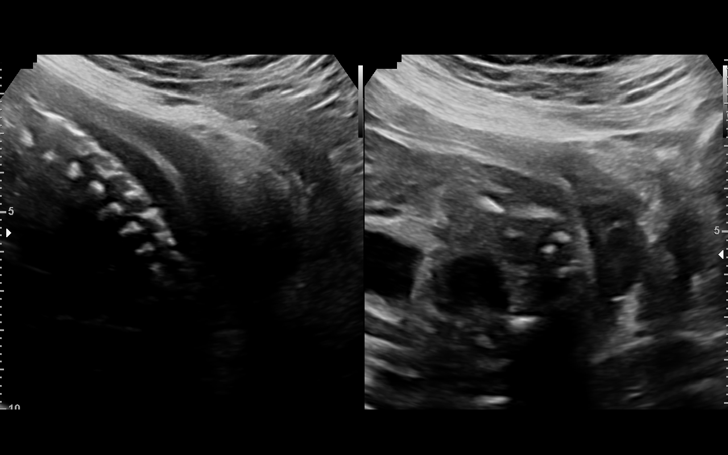
[im 65/97]
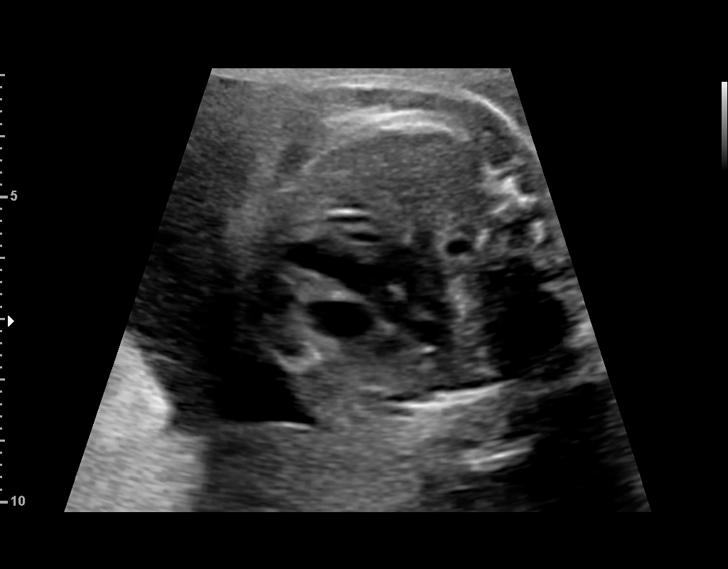
[im 72/97]
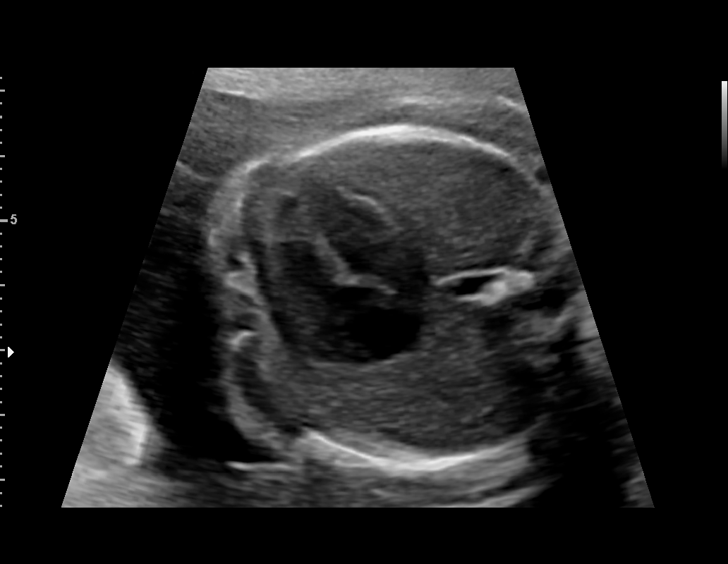
[im 79/97]
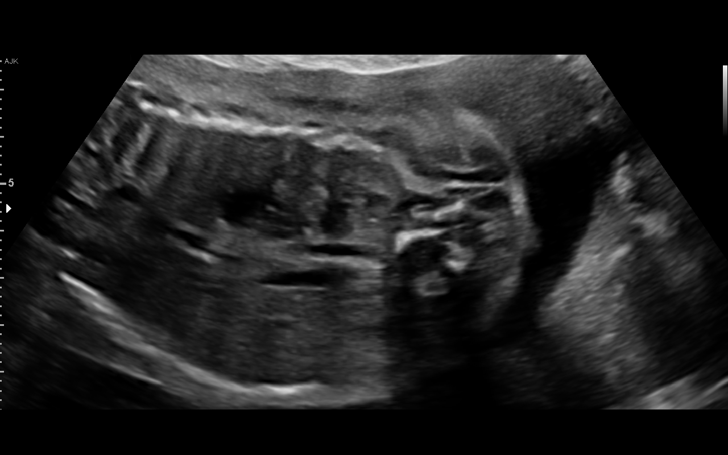
[im 86/97]
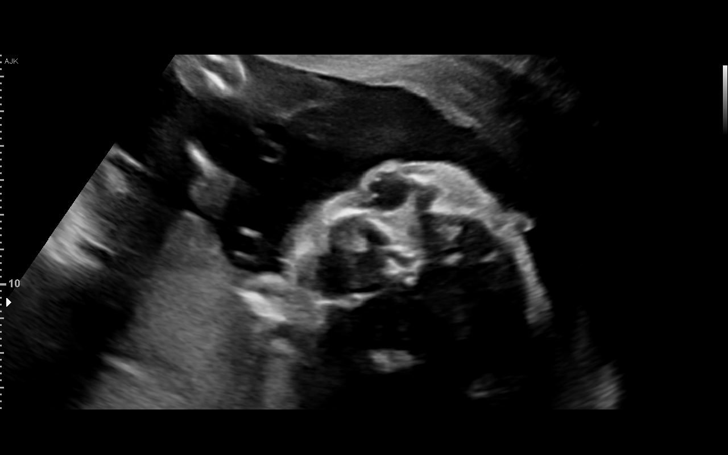
[im 93/97]
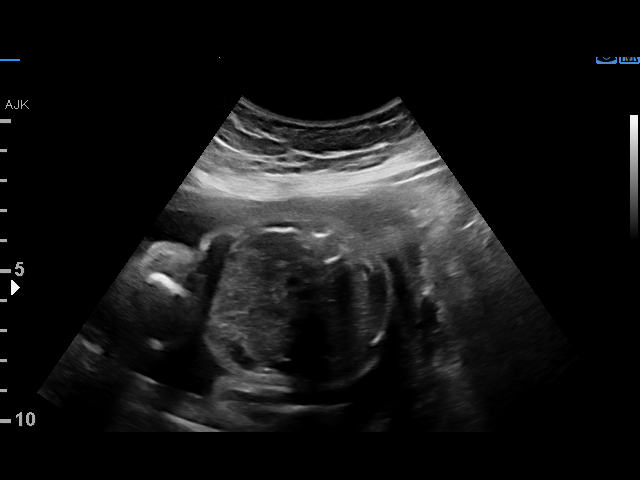

[13 of 28 positions shown; findings below may reference images not displayed]

----------------------------------------------------------------------

 ----------------------------------------------------------------------
Indications

  Encounter for antenatal screening for
  malformations (low risk panorama)
  Obesity complicating pregnancy, second
  trimester
  Poor obstetric history: Previous gestational
  diabetes
  25 weeks gestation of pregnancy
  Abnormal biochemical screen (quad [DATE]
  osb)
 ----------------------------------------------------------------------
Vital Signs

 BMI:
Fetal Evaluation

 Num Of Fetuses:         1
 Fetal Heart Rate(bpm):  151
 Cardiac Activity:       Observed
 Presentation:           Breech
 Placenta:               Fundal
 P. Cord Insertion:      Visualized

 Amniotic Fluid
 AFI FV:      Within normal limits

                             Largest Pocket(cm)

Biometry

 BPD:      62.4  mm     G. Age:  25w 2d         37  %    CI:         79.5   %    70 - 86
                                                         FL/HC:      21.3   %    18.7 -
 HC:      221.2  mm     G. Age:  24w 1d          3  %    HC/AC:      0.99        1.04 -
 AC:      222.7  mm     G. Age:  26w 5d         79  %    FL/BPD:     75.6   %    71 - 87
 FL:       47.2  mm     G. Age:  25w 5d         46  %    FL/AC:      21.2   %    20 - 24
 HUM:      46.3  mm     G. Age:  27w 2d         89  %
 CER:      29.4  mm     G. Age:  26w 1d         63  %

 LV:        6.3  mm

 Est. FW:     883  gm    1 lb 15 oz      66  %
OB History

 Gravidity:    4         Term:   2         SAB:   1
Gestational Age

 LMP:           19w 2d        Date:  12/17/18                 EDD:   09/23/19
 U/S Today:     25w 3d                                        EDD:   08/11/19
 Best:          25w 3d     Det. By:  U/S (05/01/19)           EDD:   08/11/19
Anatomy

 Cranium:               Appears normal         Aortic Arch:            Appears normal
 Cavum:                 Appears normal         Ductal Arch:            Not well visualized
 Ventricles:            Appears normal         Diaphragm:              Appears normal
 Choroid Plexus:        Appears normal         Stomach:                Appears normal, left
                                                                       sided
 Cerebellum:            Appears normal         Abdomen:                Appears normal
 Posterior Fossa:       Appears normal         Abdominal Wall:         Appears nml (cord
                                                                       insert, abd wall)
 Nuchal Fold:           Not applicable (>20    Cord Vessels:           Appears normal (3
                        wks GA)                                        vessel cord)
 Face:                  Orbits nl; profile not Kidneys:                Appear normal
                        well visualized
 Lips:                  Appears normal         Bladder:                Appears normal
 Thoracic:              Appears normal         Spine:                  Ltd views no
                                                                       intracranial signs of
                                                                       NTD
 Heart:                 Appears normal         Upper Extremities:      Appears normal
                        (4CH, axis, and
                        situs)
 RVOT:                  Appears normal         Lower Extremities:      Appears normal
 LVOT:                  Appears normal

 Other:  Fetus appears to be female. Heels and LT 5th digit visualized. Open
         hands visualized. Nasal bone visualized.
Cervix Uterus Adnexa

 Cervix
 Length:           3.24  cm.
 Normal appearance by transabdominal scan.

 Uterus
 No abnormality visualized.

 Left Ovary
 Not visualized.

 Right Ovary
 Not visualized.
 Adnexa
 No abnormality visualized.
Impression

 Ms. Bhebhe, Rantona4 P2, is here for fetal anatomy scan. On cell-
 free fetal DNA screening, the risks of fetal aneuploidies are
 not increased. MSAFP screening showed increased risk for
 open-neural tube defects (1 in 124; MoM 2.97).
 Obstetric history is significant for 2 previous term vaginal
 deliveries.
 We performed a fetal anatomy scan. No markers of
 aneuploidies or fetal structural defects are seen. Intracranial
 structures and fetal spine appear normal. Fetal biometry is
 consistent with 25w 3d gestation. Amniotic fluid is normal and
 good fetal activity is seen. Patient understands the limitations
 of ultrasound in detecting fetal anomalies.

 Her amended EDD is 08/11/2019.

 We asked the lab to recalculate the risk based on corrected
 GA. On recalculation, the risk of open spina bifida is NOT
 increased.

 We reassured the patient of the findings.

 Patient did not have genetic counseling since the results are
 normal.
Recommendations

 -An appointment was made for her to return in 4 weeks for
 completion of fetal anatomy.
                 Ambra, Gengo

## 2020-08-16 ENCOUNTER — Encounter (HOSPITAL_COMMUNITY): Payer: Self-pay

## 2020-08-16 ENCOUNTER — Ambulatory Visit (HOSPITAL_COMMUNITY)
Admission: EM | Admit: 2020-08-16 | Discharge: 2020-08-16 | Disposition: A | Discharge status: Home / Self Care | Payer: Medicaid Other

## 2020-08-16 ENCOUNTER — Other Ambulatory Visit: Payer: Self-pay

## 2020-08-16 ENCOUNTER — Emergency Department (HOSPITAL_COMMUNITY)
Admission: EM | Admit: 2020-08-16 | Discharge: 2020-08-17 | Disposition: A | Discharge status: Home / Self Care | Payer: Medicaid Other | Attending: Emergency Medicine | Admitting: Emergency Medicine

## 2020-08-16 DIAGNOSIS — Z79899 Other long term (current) drug therapy: Secondary | ICD-10-CM | POA: Diagnosis not present

## 2020-08-16 DIAGNOSIS — O24419 Gestational diabetes mellitus in pregnancy, unspecified control: Secondary | ICD-10-CM | POA: Diagnosis not present

## 2020-08-16 DIAGNOSIS — M542 Cervicalgia: Secondary | ICD-10-CM | POA: Insufficient documentation

## 2020-08-16 DIAGNOSIS — R200 Anesthesia of skin: Secondary | ICD-10-CM | POA: Insufficient documentation

## 2020-08-16 LAB — BASIC METABOLIC PANEL
Anion gap: 12 (ref 5–15)
BUN: 6 mg/dL (ref 6–20)
CO2: 25 mmol/L (ref 22–32)
Calcium: 9.2 mg/dL (ref 8.9–10.3)
Chloride: 102 mmol/L (ref 98–111)
Creatinine, Ser: 0.59 mg/dL (ref 0.44–1.00)
GFR, Estimated: >60 mL/min (ref >60)
Glucose, Bld: 92 mg/dL (ref 70–99)
Potassium: 3.9 mmol/L (ref 3.5–5.1)
Sodium: 139 mmol/L (ref 135–145)

## 2020-08-16 LAB — CBC
HCT: 43.1 % (ref 36.0–46.0)
Hemoglobin: 13.3 g/dL (ref 12.0–15.0)
MCH: 25 pg — ABNORMAL LOW (ref 26.0–34.0)
MCHC: 30.9 g/dL (ref 30.0–36.0)
MCV: 81.2 fL (ref 80.0–100.0)
Platelets: 220 10*3/uL (ref 150–400)
RBC: 5.31 MIL/uL — ABNORMAL HIGH (ref 3.87–5.11)
RDW: 14.2 % (ref 11.5–15.5)
WBC: 7.5 10*3/uL (ref 4.0–10.5)
nRBC: 0 % (ref 0.0–0.2)

## 2020-08-16 NOTE — ED Notes (Signed)
Pt stated pain in neck has increased. Pt frequently asks clinical staff for updates.

## 2020-08-16 NOTE — ED Notes (Signed)
Pt decided to go to ER because she wanted a repeat MRI. Pt informed we do not have the capability to do that here and will have to send her to the ER but our providers would be happy to see her first. She declined and would like to go to the ER.

## 2020-08-16 NOTE — ED Triage Notes (Signed)
Pt arrives to ED w/ c/o pain in back of head and neck and numbness of R arm and face, LKW 2230 last night. Pt otherwise neuro intact. Pt states she has been having intermittent arm numbness for 1 year but usually in the L arm and today it is lasting longer than normal.

## 2020-08-16 NOTE — ED Notes (Signed)
Pt repeatedly asking for lab results. Tech deferred to provider.

## 2020-08-17 ENCOUNTER — Emergency Department (HOSPITAL_COMMUNITY): Payer: Medicaid Other

## 2020-08-17 MED ORDER — PREDNISONE 10 MG (21) PO TBPK: ORAL_TABLET | Freq: Every day | ORAL | 0 refills | Status: DC

## 2020-08-17 MED ORDER — IOHEXOL 300 MG/ML  SOLN
75.0000 mL | Freq: Once | INTRAMUSCULAR | Status: AC | PRN
  Administered 2020-08-17: 75 mL via INTRAVENOUS

## 2020-08-17 NOTE — ED Notes (Signed)
Pt to CT via stretcher

## 2020-08-17 NOTE — Discharge Instructions (Addendum)
Prescription for steroid taper sent to the pharmacy.  This should help with your neck pain and numbness.  You should follow-up with [facility] for your neck pain.  You can call their office, the number is included in your discharge paperwork.  Follow-up with primary care doctor if needed.

## 2020-08-17 NOTE — ED Provider Notes (Addendum)
[facility] EMERGENCY DEPARTMENT Provider Note   CSN: [id number] Arrival date & time: 08/16/20  1559     History Chief Complaint  Patient presents with  . Numbness    Brittany Fritz is a 34 y.o. female with noncontributory past medical history.  HPI Patient presents to emergency room today with chief complaint of numbness x2 years.  Patient states she has had intermittent neck pain and numbness of bilateral upper extremities.  She states the numbness is usually in her right arm however has been in both.  Is usually not numb at the same time.  She states this morning she felt her left arm was numb and that lasted longer than it usually does.  She states she has a mass on the back of her neck that will occasionally swell.  She describes the pain as sharp.  She states that it is an the base of her head and will radiate to both arms. She states the pain has progressively worsened over the last week. She rates the pain 6 out of 10 in severity.  She has not tried any medication for symptoms prior to arrival.  She denies any fever, chills, headache, visual changes, congestion, cough, sore throat, shortness of breath, chest pain, urinary or bowel incontinence or retention, numbness, weakness, tingling. 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  Patient states she was told by a neck doctor there was nothing that needed to be done for this.    Due to language barrier, a video interpreter was present during the history-taking and subsequent discussion (and for part of the physical exam) with this patient.     Past Medical History:  Diagnosis Date  . Allergy    Seasonal  . Gestational diabetes   . Medical history non-contributory     Patient Active Problem List   Diagnosis Date Noted  . Vaginal bleeding in pregnancy, third trimester 07/27/2019  . NSVD (normal spontaneous vaginal delivery)  07/27/2019  . Hemorrhoids during pregnancy 07/16/2019  . Gestational diabetes mellitus 05/22/2019  . GBS bacteriuria 03/27/2019  . Maternal obesity, antepartum 03/20/2019  . Supervision of other normal pregnancy, antepartum 03/03/2019  . History of gestational diabetes mellitus (GDM) in prior pregnancy, currently pregnant 12/17/2016  . Immigrant with language difficulty 01/23/2016    Past Surgical History:  Procedure Laterality Date  . NO PAST SURGERIES       OB History    Gravida  4   Para  3   Term  3   Preterm      AB  1   Living  3     SAB  1   TAB      Ectopic      Multiple  0   Live Births  3           Family History  Problem Relation Age of Onset  . Heart disease Father   . Stroke Father     Social History   Tobacco Use  . Smoking status: Never Smoker  . Smokeless tobacco: Never Used  Vaping Use  . Vaping Use: Never used  Substance Use Topics  . Alcohol use: No  . Drug use: No    Home Medications Prior to Admission medications   Medication Sig Start Date End Date Taking? Authorizing Provider  ibuprofen (ADVIL) 600 MG tablet Take 1 tablet (600 mg total) by mouth every 6 (six)  hours. 07/29/19   Sparacino, Hailey L, DO  predniSONE (STERAPRED UNI-PAK 21 TAB) 10 MG (21) TBPK tablet Take by mouth daily. Take 6 tabs by mouth daily  for 2 days, then 5 tabs for 2 days, then 4 tabs for 2 days, then 3 tabs for 2 days, 2 tabs for 2 days, then 1 tab by mouth daily for 2 days 08/17/20   Shanon Ace, PA-C  Prenatal Vit-Fe Fum-Fe Bisg-FA (NATACHEW) 28-1 MG CHEW Chew 1 tablet by mouth at bedtime. Patient not taking: Reported on 03/20/2019 04/21/17   Orvilla Cornwall A, CNM    Allergies    Penicillins  Review of Systems   Review of Systems All other systems are reviewed and are negative for acute change except as noted in the HPI.  Physical Exam Updated Vital Signs BP (!) 132/94   Pulse (!) 104   Temp 98.6 F (37 C) (Oral)   Resp 16    SpO2 100%   Physical Exam Vitals and nursing note reviewed.  Constitutional:      General: She is not in acute distress.    Appearance: She is not ill-appearing.  HENT:     Head: Normocephalic and atraumatic.     Comments: No tenderness to palpation of skull. No deformities or crepitus noted. No open wounds, abrasions or lacerations.    Right Ear: Tympanic membrane and external ear normal.     Left Ear: Tympanic membrane and external ear normal.     Nose: Nose normal.     Mouth/Throat:     Mouth: Mucous membranes are moist.     Pharynx: Oropharynx is clear.  Eyes:     General: No scleral icterus.       Right eye: No discharge.        Left eye: No discharge.     Extraocular Movements: Extraocular movements intact.     Conjunctiva/sclera: Conjunctivae normal.     Pupils: Pupils are equal, round, and reactive to light.  Neck:     Vascular: No JVD.      Comments: Full ROM intact without spinous process TTP. No bony stepoffs or deformities, no paraspinous muscle TTP or muscle spasms. No rigidity or meningeal signs. Cardiovascular:     Rate and Rhythm: Normal rate and regular rhythm.     Pulses: Normal pulses.          Radial pulses are 2+ on the right side and 2+ on the left side.     Heart sounds: Normal heart sounds.  Pulmonary:     Comments: Lungs clear to auscultation in all fields. Symmetric chest rise. No wheezing, rales, or rhonchi. Abdominal:     Comments: Abdomen is soft, non-distended, and non-tender in all quadrants. No rigidity, no guarding. No peritoneal signs.  Musculoskeletal:        General: Normal range of motion.     Cervical back: Normal range of motion.  Skin:    General: Skin is warm and dry.     Capillary Refill: Capillary refill takes less than 2 seconds.  Neurological:     Mental Status: She is oriented to person, place, and time.     GCS: GCS eye subscore is 4. GCS verbal subscore is 5. GCS motor subscore is 6.     Comments: Speech is clear and goal  oriented, follows commands CN III-XII intact, no facial droop Normal strength in upper and lower extremities bilaterally including dorsiflexion and plantar flexion, strong and equal grip strength Sensation normal  to light and sharp touch Moves extremities without ataxia, coordination intact Normal finger to nose and rapid alternating movements Normal gait and balance  Psychiatric:        Behavior: Behavior normal.     ED Results / Procedures / Treatments   Labs (all labs ordered are listed, but only abnormal results are displayed) Labs Reviewed  CBC - Abnormal; Notable for the following components:      Result Value   RBC 5.31 (*)    MCH 25.0 (*)    All other components within normal limits  BASIC METABOLIC PANEL    EKG None  Radiology CT Soft Tissue Neck W Contrast  Result Date: 08/17/2020 CLINICAL DATA:  Initial evaluation for posterior soft tissue mass, base of cervical spine/neck. EXAM: CT NECK WITH CONTRAST TECHNIQUE: Multidetector CT imaging of the neck was performed using the standard protocol following the bolus administration of intravenous contrast. CONTRAST:  14mL OMNIPAQUE IOHEXOL 300 MG/ML  SOLN COMPARISON:  None. FINDINGS: Pharynx and larynx: Oral cavity within normal limits. No acute abnormality about the dentition. No base of tongue lesion. Palatine tonsils symmetric and normal. Parapharyngeal fat maintained. Remainder of the nasopharynx and oropharynx within normal limits. No retropharyngeal collection. Epiglottis normal. Vallecula largely clear. Remainder of the hypopharynx and supraglottic larynx within normal limits. True cords symmetric and normal. Subglottic airway clear. Salivary glands: Salivary glands including the parotid and submandibular glands are within normal limits. Thyroid: Normal. Lymph nodes: No enlarged or pathologic adenopathy within the neck. Vascular: Normal intravascular enhancement seen throughout the neck. Limited intracranial: Unremarkable.  Visualized orbits: Globes and orbital soft tissues within normal limits. Mastoids and visualized paranasal sinuses: Visualized paranasal sinuses are clear. Mastoid air cells and middle ear cavities are well pneumatized and free of fluid. Skeleton: No acute osseous abnormality. No discrete or worrisome osseous lesions. Upper chest: Visualized upper chest demonstrates no acute finding. Partially visualized lungs are clear. Other: No soft tissue mass seen about the posterior neck/cervical spine to correspond with patient's symptoms. No discrete lipoma. No abscess or other collection identified. No acute inflammatory changes seen about the neck. No findings to correspond with patient's symptoms identified. IMPRESSION: Normal CT of the neck. No soft tissue mass or other abnormality identified. No findings to correspond with patient's symptoms identified. Electronically Signed   By: Rise Mu M.D.   On: 08/17/2020 02:46    Procedures Procedures (including critical care time)  Medications Ordered in ED Medications  iohexol (OMNIPAQUE) 300 MG/ML solution 75 mL (75 mLs Intravenous Contrast Given 08/17/20 0206)    ED Course  I have reviewed the triage vital signs and the nursing notes.  Pertinent labs & imaging results that were available during my care of the patient were reviewed by me and considered in my medical decision making (see chart for details).    MDM Rules/Calculators/A&P                          History provided by patient with additional history obtained from chart review.    34 yo female presenting with neck pain and arm numbness. She is afebrile, HDS.  She was noted to be tachycardic to 104 in triage.  On my exam her rate is in the 80s. She has chief complaint of numbness however on exam is denying any.  She has a normal neuro exam.  No focal weakness, no numbness appreciated in extremities. She does have a fluctuant area on the base of  her neck that is nontender to palpation.   No infectious signs on exam. It varies in size per patient and has not been evaluated in the past.  Labs were collected in triage.  CBC and BMP overall unremarkable.  CT soft tissue neck pain and is unremarkable.  There is no fluid collection, abscess or concerning findings.  On reassessment she is still denying having any numbness.  As her neck pain and numbness is been ongoing for 2 years recommend she follow-up outpatient with neurosurgery.  Pain is possibly caused by cervical radiculopathy.  Will prescribe prednisone taper and recommend symptomatic care at home. Patient and spouse agreeable with plan of care.  The patient appears reasonably screened and/or stabilized for discharge and I doubt any other medical condition or other [facility] requiring further screening, evaluation, or treatment in the ED at this time prior to discharge. The patient is safe for discharge with strict return precautions discussed.   Portions of this note were generated with Scientist, clinical (histocompatibility and immunogenetics)Dragon dictation software. Dictation errors may occur despite best attempts at proofreading.   Final Clinical Impression(s) / ED Diagnoses Final diagnoses:  Neck pain    Rx / DC Orders ED Discharge Orders         Ordered    predniSONE (STERAPRED UNI-PAK 21 TAB) 10 MG (21) TBPK tablet  Daily        08/17/20 0330           Shanon AceWalisiewicz, Franklin Baumbach E, PA-C 08/17/20 0357    Shanon AceWalisiewicz, Allena Pietila E, PA-C 08/17/20 0412    Ward, Layla MawKristen N, DO 08/17/20 [phone]

## 2020-08-22 ENCOUNTER — Encounter (HOSPITAL_COMMUNITY): Payer: Self-pay

## 2020-08-22 ENCOUNTER — Emergency Department (HOSPITAL_COMMUNITY)
Admission: EM | Admit: 2020-08-22 | Discharge: 2020-08-22 | Disposition: A | Discharge status: Home / Self Care | Payer: Medicaid Other | Attending: Emergency Medicine | Admitting: Emergency Medicine

## 2020-08-22 DIAGNOSIS — G8929 Other chronic pain: Secondary | ICD-10-CM | POA: Diagnosis not present

## 2020-08-22 DIAGNOSIS — M542 Cervicalgia: Secondary | ICD-10-CM | POA: Diagnosis not present

## 2020-08-22 DIAGNOSIS — T50905A Adverse effect of unspecified drugs, medicaments and biological substances, initial encounter: Secondary | ICD-10-CM | POA: Insufficient documentation

## 2020-08-22 DIAGNOSIS — R519 Headache, unspecified: Secondary | ICD-10-CM | POA: Insufficient documentation

## 2020-08-22 LAB — CBC
HCT: 42.8 % (ref 36.0–46.0)
Hemoglobin: 13.4 g/dL (ref 12.0–15.0)
MCH: 25.6 pg — ABNORMAL LOW (ref 26.0–34.0)
MCHC: 31.3 g/dL (ref 30.0–36.0)
MCV: 81.8 fL (ref 80.0–100.0)
Platelets: 208 10*3/uL (ref 150–400)
RBC: 5.23 MIL/uL — ABNORMAL HIGH (ref 3.87–5.11)
RDW: 14.3 % (ref 11.5–15.5)
WBC: 8.8 10*3/uL (ref 4.0–10.5)
nRBC: 0 % (ref 0.0–0.2)

## 2020-08-22 LAB — TROPONIN I (HIGH SENSITIVITY): Troponin I (High Sensitivity): 3 ng/L (ref <18)

## 2020-08-22 LAB — I-STAT BETA HCG BLOOD, ED (MC, WL, AP ONLY): I-stat hCG, quantitative: <5 m[IU]/mL (ref <5)

## 2020-08-22 LAB — BASIC METABOLIC PANEL
Anion gap: 11 (ref 5–15)
BUN: 15 mg/dL (ref 6–20)
CO2: 25 mmol/L (ref 22–32)
Calcium: 8.8 mg/dL — ABNORMAL LOW (ref 8.9–10.3)
Chloride: 104 mmol/L (ref 98–111)
Creatinine, Ser: 0.84 mg/dL (ref 0.44–1.00)
GFR, Estimated: >60 mL/min (ref >60)
Glucose, Bld: 198 mg/dL — ABNORMAL HIGH (ref 70–99)
Potassium: 4 mmol/L (ref 3.5–5.1)
Sodium: 140 mmol/L (ref 135–145)

## 2020-08-22 MED ORDER — KETOROLAC TROMETHAMINE 15 MG/ML IJ SOLN
15.0000 mg | Freq: Once | INTRAMUSCULAR | Status: AC
  Administered 2020-08-22: 15 mg via INTRAVENOUS
  Filled 2020-08-22: qty 1

## 2020-08-22 MED ORDER — SODIUM CHLORIDE 0.9 % IV BOLUS
1000.0000 mL | Freq: Once | INTRAVENOUS | Status: AC
  Administered 2020-08-22: 1000 mL via INTRAVENOUS

## 2020-08-22 MED ORDER — NAPROXEN 375 MG PO TABS: 375.0000 mg | ORAL_TABLET | Freq: Two times a day (BID) | ORAL | 0 refills | Status: AC

## 2020-08-22 MED ORDER — METOCLOPRAMIDE HCL 5 MG/ML IJ SOLN
10.0000 mg | Freq: Once | INTRAMUSCULAR | Status: AC
  Administered 2020-08-22: 10 mg via INTRAVENOUS
  Filled 2020-08-22: qty 2

## 2020-08-22 MED ORDER — CYCLOBENZAPRINE HCL 10 MG PO TABS: 10.0000 mg | ORAL_TABLET | Freq: Two times a day (BID) | ORAL | 0 refills | Status: AC | PRN

## 2020-08-22 MED ORDER — DIPHENHYDRAMINE HCL 50 MG/ML IJ SOLN
25.0000 mg | Freq: Once | INTRAMUSCULAR | Status: AC
  Administered 2020-08-22: 25 mg via INTRAVENOUS
  Filled 2020-08-22: qty 1

## 2020-08-22 NOTE — ED Triage Notes (Signed)
Pt arrives to ED w/ c/o tachycardia, dizziness, and headache since being started on prednisone. Pt AOx4, neuro intact.

## 2020-08-22 NOTE — ED Provider Notes (Signed)
[facility] EMERGENCY DEPARTMENT Provider Note   CSN: [id number] Arrival date & time: 08/22/20  6213     History Chief Complaint  Patient presents with  . Tachycardia    Brittany Fritz is a 34 y.o. female.  The history is limited by a language barrier. A language interpreter was used.  Neck Injury This is a chronic problem. The current episode started more than 2 days ago. The problem occurs daily. Progression since onset: waxing and waining. Associated symptoms include headaches. Pertinent negatives include no chest pain and no shortness of breath. Exacerbated by: prednisone. Nothing relieves the symptoms.       Past Medical History:  Diagnosis Date  . Allergy    Seasonal  . Gestational diabetes   . Medical history non-contributory     Patient Active Problem List   Diagnosis Date Noted  . Vaginal bleeding in pregnancy, third trimester 07/27/2019  . NSVD (normal spontaneous vaginal delivery) 07/27/2019  . Hemorrhoids during pregnancy 07/16/2019  . Gestational diabetes mellitus 05/22/2019  . GBS bacteriuria 03/27/2019  . Maternal obesity, antepartum 03/20/2019  . Supervision of other normal pregnancy, antepartum 03/03/2019  . History of gestational diabetes mellitus (GDM) in prior pregnancy, currently pregnant 12/17/2016  . Immigrant with language difficulty 01/23/2016    Past Surgical History:  Procedure Laterality Date  . NO PAST SURGERIES       OB History    Gravida  4   Para  3   Term  3   Preterm      AB  1   Living  3     SAB  1   TAB      Ectopic      Multiple  0   Live Births  3           Family History  Problem Relation Age of Onset  . Heart disease Father   . Stroke Father     Social History   Tobacco Use  . Smoking status: Never Smoker  . Smokeless tobacco: Never Used  Vaping Use  . Vaping Use: Never used  Substance Use Topics  . Alcohol use: No  . Drug use: No    Home Medications Prior to  Admission medications   Medication Sig Start Date End Date Taking? Authorizing Provider  cyclobenzaprine (FLEXERIL) 10 MG tablet Take 1 tablet (10 mg total) by mouth 2 (two) times daily as needed for up to 20 days for muscle spasms. 08/22/20 09/11/20  Sabino Donovan, MD  ibuprofen (ADVIL) 600 MG tablet Take 1 tablet (600 mg total) by mouth every 6 (six) hours. 07/29/19   Sparacino, Hailey L, DO  naproxen (NAPROSYN) 375 MG tablet Take 1 tablet (375 mg total) by mouth 2 (two) times daily for 20 days. 08/22/20 09/11/20  Sabino Donovan, MD  predniSONE (STERAPRED UNI-PAK 21 TAB) 10 MG (21) TBPK tablet Take by mouth daily. Take 6 tabs by mouth daily  for 2 days, then 5 tabs for 2 days, then 4 tabs for 2 days, then 3 tabs for 2 days, 2 tabs for 2 days, then 1 tab by mouth daily for 2 days 08/17/20   Shanon Ace, PA-C  Prenatal Vit-Fe Fum-Fe Bisg-FA (NATACHEW) 28-1 MG CHEW Chew 1 tablet by mouth at bedtime. Patient not taking: Reported on 03/20/2019 04/21/17   Orvilla Cornwall A, CNM    Allergies    Penicillins  Review of Systems   Review of Systems  Constitutional: Negative for chills  and fever.  HENT: Negative for congestion and rhinorrhea.   Respiratory: Negative for cough and shortness of breath.   Cardiovascular: Positive for palpitations. Negative for chest pain.  Gastrointestinal: Negative for diarrhea, nausea and vomiting.  Genitourinary: Negative for difficulty urinating and dysuria.  Musculoskeletal: Positive for neck pain and neck stiffness. Negative for arthralgias and back pain.  Skin: Negative for rash and wound.  Neurological: Positive for dizziness, light-headedness and headaches.    Physical Exam Updated Vital Signs BP 100/77   Pulse 76   Temp (!) 97.5 F (36.4 C) (Oral)   Resp 20   SpO2 93%   Physical Exam Vitals and nursing note reviewed. Exam conducted with a chaperone present.  Constitutional:      General: She is not in acute distress.    Appearance: Normal  appearance.  HENT:     Head: Normocephalic and atraumatic.     Nose: No rhinorrhea.  Eyes:     General:        Right eye: No discharge.        Left eye: No discharge.     Conjunctiva/sclera: Conjunctivae normal.  Neck:     Comments: Soft tissue swelling in the posterior neck at C6-C7, no significant tenderness to palpation normal range of motion no color change no warmth Cardiovascular:     Rate and Rhythm: Normal rate and regular rhythm.  Pulmonary:     Effort: Pulmonary effort is normal. No respiratory distress.     Breath sounds: No stridor.  Abdominal:     General: Abdomen is flat. There is no distension.     Palpations: Abdomen is soft.  Musculoskeletal:        General: No tenderness or signs of injury.     Cervical back: Normal range of motion and neck supple. No tenderness.  Skin:    General: Skin is warm and dry.  Neurological:     General: No focal deficit present.     Mental Status: She is alert. Mental status is at baseline.     Motor: No weakness.     Comments: Full grip strength in bilateral extremities, sensation intact, intact pulses, able to ambulate without difficulty  Psychiatric:        Mood and Affect: Mood normal.        Behavior: Behavior normal.     ED Results / Procedures / Treatments   Labs (all labs ordered are listed, but only abnormal results are displayed) Labs Reviewed  BASIC METABOLIC PANEL - Abnormal; Notable for the following components:      Result Value   Glucose, Bld 198 (*)    Calcium 8.8 (*)    All other components within normal limits  CBC - Abnormal; Notable for the following components:   RBC 5.23 (*)    MCH 25.6 (*)    All other components within normal limits  I-STAT BETA HCG BLOOD, ED ([facility], [facility], [facility] ONLY)  TROPONIN I (HIGH SENSITIVITY)  TROPONIN I (HIGH SENSITIVITY)    EKG EKG Interpretation  Date/Time:  Thursday August 22 2020 18:57:25 EST Ventricular Rate:  102 PR Interval:  120 QRS Duration: 94 QT  Interval:  362 QTC Calculation: 471 R Axis:   85 Text Interpretation: Sinus tachycardia Otherwise normal ECG Confirmed by Cherlynn Perches ([id number]) on 08/22/2020 7:46:13 PM   Radiology No results found.  Procedures Procedures (including critical care time)  Medications Ordered in ED Medications  ketorolac (TORADOL) 15 MG/ML injection 15 mg (15 mg Intravenous Given  08/22/20 2006)  sodium chloride 0.9 % bolus 1,000 mL (1,000 mLs Intravenous New Bag/Given 08/22/20 2006)  metoCLOPramide (REGLAN) injection 10 mg (10 mg Intravenous Given 08/22/20 2007)  diphenhydrAMINE (BENADRYL) injection 25 mg (25 mg Intravenous Given 08/22/20 2006)    ED Course  I have reviewed the triage vital signs and the nursing notes.  Pertinent labs & imaging results that were available during my care of the patient were reviewed by me and considered in my medical decision making (see chart for details).    MDM Rules/Calculators/A&P                           Chronic worsening neck pain.  Has outpatient work-up pending.  However steroid treatment prescribed 5 days ago seems to be causing significant side effects.  Lab tests pending to evaluate for electrolyte derangements.  EKG is obtained and shows sinus rhythm without acute ischemic change interval abnormality or arrhythmia.  She denies chest pain shortness of breath.  Troponin was ordered as part of work-up I do not feel we need a second 1.  CBC shows no significant derangement still waiting electrolyte panel.  Pregnancy test is negative.  She seems to indicate that this neck pain causes her headaches.  She has normal neurologic exam.  Normal mental status.  We will give migraine/tension headache therapy to include fluids Toradol Reglan Benadryl.  Laboratory studies reviewed by myself are without any significant findings.  Patient's electrolytes are within normal limits other than mild hyperglycemia, likely secondary to prednisone.  I feel most of her symptoms are  related to prednisone.  Headache therapy is relieved her headache significantly she feels comfortable discharge, anti-inflammatories prescribed muscle relaxers prescribed.  Outpatient follow-up recommended strict return precautions provided  Final Clinical Impression(s) / ED Diagnoses Final diagnoses:  Medication reaction, initial encounter    Rx / DC Orders ED Discharge Orders         Ordered    naproxen (NAPROSYN) 375 MG tablet  2 times daily        08/22/20 2052    cyclobenzaprine (FLEXERIL) 10 MG tablet  2 times daily PRN        08/22/20 2052           Sabino Donovan, MD 08/22/20 2053

## 2020-08-22 NOTE — Discharge Instructions (Signed)
Take the anti-inflammatories twice daily, take the muscle relaxer as needed twice daily.  Be careful operating heavy machinery (including driving a car) as this can be dangerous with the muscle relaxer as it is mildly sedating.  Follow-up with the neurosurgeon and come back with any changes.

## 2022-08-29 ENCOUNTER — Other Ambulatory Visit: Payer: Self-pay

## 2022-08-29 ENCOUNTER — Ambulatory Visit (HOSPITAL_COMMUNITY)
Admission: EM | Admit: 2022-08-29 | Discharge: 2022-08-29 | Disposition: A | Discharge status: Home / Self Care | Payer: Medicaid Other | Attending: Emergency Medicine | Admitting: Emergency Medicine

## 2022-08-29 ENCOUNTER — Encounter (HOSPITAL_COMMUNITY): Payer: Self-pay | Admitting: Emergency Medicine

## 2022-08-29 DIAGNOSIS — J069 Acute upper respiratory infection, unspecified: Secondary | ICD-10-CM | POA: Diagnosis not present

## 2022-08-29 MED ORDER — GUAIFENESIN ER 600 MG PO TB12: 600.0000 mg | ORAL_TABLET | Freq: Two times a day (BID) | ORAL | 0 refills | Status: AC

## 2022-08-29 NOTE — Discharge Instructions (Addendum)
I recommend the mucinex twice daily for the next 5 days. This will help with cough and congestion. Make sure you are drinking lots of fluids.  You likely have a virus causing symptoms. This can take some time to clear.

## 2022-08-29 NOTE — ED Triage Notes (Signed)
Pt here for cough and congestion x 4 days 

## 2022-08-29 NOTE — ED Provider Notes (Signed)
[facility]    CSN: [id number] Arrival date & time: 08/29/22  1558      History   Chief Complaint Chief Complaint  Patient presents with   Cough    HPI Brittany Fritz is a 36 y.o. female.  Presents with 4-day history of nasal congestion and productive cough Reports her son was sick recently with similar She denies fevers.  No sore throat.  No rash. She has been eating and drinking normally.  No GI symptoms.  Had a home COVID test that was negative  Has tried TheraFlu without relief  Past Medical History:  Diagnosis Date   Allergy    Seasonal   Gestational diabetes    Medical history non-contributory     Patient Active Problem List   Diagnosis Date Noted   Vaginal bleeding in pregnancy, third trimester 07/27/2019   NSVD (normal spontaneous vaginal delivery) 07/27/2019   Hemorrhoids during pregnancy 07/16/2019   Gestational diabetes mellitus 05/22/2019   GBS bacteriuria 03/27/2019   Maternal obesity, antepartum 03/20/2019   Supervision of other normal pregnancy, antepartum 03/03/2019   History of gestational diabetes mellitus (GDM) in prior pregnancy, currently pregnant 12/17/2016   Immigrant with language difficulty 01/23/2016    Past Surgical History:  Procedure Laterality Date   NO PAST SURGERIES      OB History     Gravida  4   Para  3   Term  3   Preterm      AB  1   Living  3      SAB  1   IAB      Ectopic      Multiple  0   Live Births  3            Home Medications    Prior to Admission medications   Medication Sig Start Date End Date Taking? Authorizing Provider  guaiFENesin (MUCINEX) 600 MG 12 hr tablet Take 1 tablet (600 mg total) by mouth 2 (two) times daily for 5 days. 08/29/22 09/03/22 Yes Timothy Trudell, Lurena Joiner, PA-C  ibuprofen (ADVIL) 600 MG tablet Take 1 tablet (600 mg total) by mouth every 6 (six) hours. 07/29/19   Sparacino, Hailey L, DO  predniSONE (STERAPRED UNI-PAK 21 TAB) 10 MG (21) TBPK tablet Take by  mouth daily. Take 6 tabs by mouth daily  for 2 days, then 5 tabs for 2 days, then 4 tabs for 2 days, then 3 tabs for 2 days, 2 tabs for 2 days, then 1 tab by mouth daily for 2 days Patient not taking: Reported on 08/29/2022 08/17/20   Shanon Ace, PA-C  Prenatal Vit-Fe Fum-Fe Bisg-FA (NATACHEW) 28-1 MG CHEW Chew 1 tablet by mouth at bedtime. Patient not taking: Reported on 03/20/2019 04/21/17   Roe Coombs, CNM    Family History Family History  Problem Relation Age of Onset   Heart disease Father    Stroke Father     Social History Social History   Tobacco Use   Smoking status: Never   Smokeless tobacco: Never  Vaping Use   Vaping Use: Never used  Substance Use Topics   Alcohol use: No   Drug use: No     Allergies   Penicillins   Review of Systems Review of Systems  Respiratory:  Positive for cough.    As per HPI  Physical Exam Triage Vital Signs ED Triage Vitals [08/29/22 1751]  Enc Vitals Group     BP 125/79     Pulse  Rate 93     Resp 18     Temp 98 F (36.7 C)     Temp Source Oral     SpO2 96 %     Weight      Height      Head Circumference      Peak Flow      Pain Score 0     Pain Loc      Pain Edu?      Excl. in GC?    No data found.  Updated Vital Signs BP 125/79 (BP Location: Right Arm)   Pulse 93   Temp 98 F (36.7 C) (Oral)   Resp 18   SpO2 96%      Physical Exam Vitals and nursing note reviewed.  Constitutional:      General: She is not in acute distress. HENT:     Nose: No congestion or rhinorrhea.     Mouth/Throat:     Mouth: Mucous membranes are moist.     Pharynx: Uvula midline. No posterior oropharyngeal erythema.     Tonsils: No tonsillar exudate or tonsillar abscesses.  Eyes:     Conjunctiva/sclera: Conjunctivae normal.     Pupils: Pupils are equal, round, and reactive to light.  Cardiovascular:     Rate and Rhythm: Normal rate and regular rhythm.     Heart sounds: Normal heart sounds.  Pulmonary:      Effort: Pulmonary effort is normal. No respiratory distress.     Breath sounds: Normal breath sounds. No wheezing.  Musculoskeletal:     Cervical back: Normal range of motion.  Lymphadenopathy:     Cervical: No cervical adenopathy.  Neurological:     Mental Status: She is alert and oriented to person, place, and time.     UC Treatments / Results  Labs (all labs ordered are listed, but only abnormal results are displayed) Labs Reviewed - No data to display  EKG  Radiology No results found.  Procedures Procedures   Medications Ordered in UC Medications - No data to display  Initial Impression / Assessment and Plan / UC Course  I have reviewed the triage vital signs and the nursing notes.  Pertinent labs & imaging results that were available during my care of the patient were reviewed by me and considered in my medical decision making (see chart for details).  Well appearing, afebrile here, occasional dry sounding cough in clinic.  Discussed possible viral etiology.  Deferred viral testing today given day 4 of symptoms and already had negative COVID test. Discussed symptomatic care including Mucinex twice a day with lots of fluids.  Discussed virus may take some time to clear, can return to urgent care if symptoms are persistent despite medications.  Patient agrees to plan  Final Clinical Impressions(s) / UC Diagnoses   Final diagnoses:  Viral URI with cough     Discharge Instructions      I recommend the mucinex twice daily for the next 5 days. This will help with cough and congestion. Make sure you are drinking lots of fluids.  You likely have a virus causing symptoms. This can take some time to clear.    ED Prescriptions     Medication Sig Dispense Auth. Provider   guaiFENesin (MUCINEX) 600 MG 12 hr tablet Take 1 tablet (600 mg total) by mouth 2 (two) times daily for 5 days. 10 tablet Darryll Raju, Lurena Joiner, PA-C      PDMP not reviewed this encounter.   Bilan Tedesco,  Lurena Joiner, PA-C 08/29/22 1837

## 2022-11-15 NOTE — Progress Notes (Deleted)
Cardiology Office Note:    Date:  11/15/2022   ID:  Brittany Fritz, DOB 27-Nov-1985, MRN [id number]  PCP:  [facility] [place] Providers Cardiologist:  None {  Referring MD: Marda Stalker, PA-C    History of Present Illness:    Brittany Fritz is a 37 y.o. female with a hx of gestational diabetes who was referred by Marda Stalker, PA-C for further evaluation of left sided chest pain.  Patient seen by Marda Stalker, PA-C on 10/22/22 where she was complaining of atypical, constant left sided chest pain. ECG with NSR, no ischemic changes. She is now referred to Cardiology for further evaluation.  Today, ***  Past Medical History:  Diagnosis Date   Allergy    Seasonal   Gestational diabetes    Medical history non-contributory     Past Surgical History:  Procedure Laterality Date   NO PAST SURGERIES      Current Medications: No outpatient medications have been marked as taking for the 11/18/22 encounter (Appointment) with Freada Bergeron, MD.     Allergies:   Penicillins   Social History   Socioeconomic History   Marital status: Married    Spouse name: Not on file   Number of children: Not on file   Years of education: Not on file   Highest education level: Not on file  Occupational History   Occupation: unemployed  Tobacco Use   Smoking status: Never   Smokeless tobacco: Never  Vaping Use   Vaping Use: Never used  Substance and Sexual Activity   Alcohol use: No   Drug use: No   Sexual activity: Yes    Birth control/protection: None  Other Topics Concern   Not on file  Social History Narrative   Not on file   Social Determinants of Health   Financial Resource Strain: Not on file  Food Insecurity: Not on file  Transportation Needs: Not on file  Physical Activity: Not on file  Stress: Not on file  Social Connections: Not on file     Family History: The patient's ***family history includes Heart disease in her  father; Stroke in her father.  ROS:   Please see the history of present illness.    *** All other systems reviewed and are negative.  EKGs/Labs/Other Studies Reviewed:    The following studies were reviewed today: ***  EKG:  EKG is *** ordered today.  The ekg ordered today demonstrates ***  Recent Labs: No results found for requested labs within last 365 days.  Recent Lipid Panel    Component Value Date/Time   CHOL 195 01/23/2016 1348   TRIG 107 01/23/2016 1348   HDL 58 01/23/2016 1348     Risk Assessment/Calculations:   {Does this patient have ATRIAL FIBRILLATION?[phone]}  No BP recorded.  {Refresh Note OR Click here to enter BP  :1}***         Physical Exam:    VS:  There were no vitals taken for this visit.    Wt Readings from Last 3 Encounters:  07/27/19 223 lb 15.8 oz (101.6 kg)  07/25/19 224 lb (101.6 kg)  07/17/19 225 lb 8 oz (102.3 kg)     GEN: *** Well nourished, well developed in no acute distress HEENT: Normal NECK: No JVD; No carotid bruits LYMPHATICS: No lymphadenopathy CARDIAC: ***RRR, no murmurs, rubs, gallops RESPIRATORY:  Clear to auscultation without rales, wheezing or rhonchi  ABDOMEN: Soft, non-tender, non-distended MUSCULOSKELETAL:  No edema; No deformity  SKIN: Warm and dry NEUROLOGIC:  Alert and oriented x 3 PSYCHIATRIC:  Normal affect   ASSESSMENT:    No diagnosis found. PLAN:    In order of problems listed above:  #Atypical Chest Pain: Patient is left sided and constant in nature. Developed after an injection at the dentist. Not worsened by exertion. Overall, does not sound cardiac in nature. Will check TTE for further evaluation.      {Are you ordering a CV Procedure (e.g. stress test, cath, DCCV, TEE, etc)?   Press F2        :[id number]    Medication Adjustments/Labs and Tests Ordered: Current medicines are reviewed at length with the patient today.  Concerns regarding medicines are outlined above.  No orders of the  defined types were placed in this encounter.  No orders of the defined types were placed in this encounter.   There are no Patient Instructions on file for this visit.   Signed, Freada Bergeron, MD  11/15/2022 7:41 PM    [place]

## 2022-11-18 ENCOUNTER — Ambulatory Visit: Payer: Medicaid Other | Admitting: Cardiology

## 2023-02-16 ENCOUNTER — Emergency Department (HOSPITAL_BASED_OUTPATIENT_CLINIC_OR_DEPARTMENT_OTHER): Payer: Medicaid Other | Admitting: Radiology

## 2023-02-16 ENCOUNTER — Other Ambulatory Visit: Payer: Self-pay

## 2023-02-16 ENCOUNTER — Encounter (HOSPITAL_BASED_OUTPATIENT_CLINIC_OR_DEPARTMENT_OTHER): Payer: Self-pay | Admitting: Emergency Medicine

## 2023-02-16 ENCOUNTER — Emergency Department (HOSPITAL_BASED_OUTPATIENT_CLINIC_OR_DEPARTMENT_OTHER)
Admission: EM | Admit: 2023-02-16 | Discharge: 2023-02-16 | Disposition: A | Discharge status: Home / Self Care | Payer: Medicaid Other | Attending: Emergency Medicine | Admitting: Emergency Medicine

## 2023-02-16 ENCOUNTER — Other Ambulatory Visit (HOSPITAL_BASED_OUTPATIENT_CLINIC_OR_DEPARTMENT_OTHER): Payer: Self-pay

## 2023-02-16 DIAGNOSIS — R0789 Other chest pain: Secondary | ICD-10-CM | POA: Diagnosis present

## 2023-02-16 DIAGNOSIS — R110 Nausea: Secondary | ICD-10-CM | POA: Diagnosis not present

## 2023-02-16 DIAGNOSIS — Z7984 Long term (current) use of oral hypoglycemic drugs: Secondary | ICD-10-CM | POA: Insufficient documentation

## 2023-02-16 DIAGNOSIS — R5383 Other fatigue: Secondary | ICD-10-CM | POA: Insufficient documentation

## 2023-02-16 DIAGNOSIS — R079 Chest pain, unspecified: Secondary | ICD-10-CM

## 2023-02-16 LAB — COMPREHENSIVE METABOLIC PANEL
ALT: 8 U/L (ref 0–44)
AST: 10 U/L — ABNORMAL LOW (ref 15–41)
Albumin: 4.3 g/dL (ref 3.5–5.0)
Alkaline Phosphatase: 76 U/L (ref 38–126)
Anion gap: 9 (ref 5–15)
BUN: 12 mg/dL (ref 6–20)
CO2: 26 mmol/L (ref 22–32)
Calcium: 9.2 mg/dL (ref 8.9–10.3)
Chloride: 104 mmol/L (ref 98–111)
Creatinine, Ser: 0.61 mg/dL (ref 0.44–1.00)
GFR, Estimated: >60 mL/min (ref >60)
Glucose, Bld: 102 mg/dL — ABNORMAL HIGH (ref 70–99)
Potassium: 4.3 mmol/L (ref 3.5–5.1)
Sodium: 139 mmol/L (ref 135–145)
Total Bilirubin: 0.3 mg/dL (ref 0.3–1.2)
Total Protein: 7.1 g/dL (ref 6.5–8.1)

## 2023-02-16 LAB — CBC WITH DIFFERENTIAL/PLATELET
Abs Immature Granulocytes: 0.01 10*3/uL (ref 0.00–0.07)
Basophils Absolute: 0 10*3/uL (ref 0.0–0.1)
Basophils Relative: 0 %
Eosinophils Absolute: 0.3 10*3/uL (ref 0.0–0.5)
Eosinophils Relative: 5 %
HCT: 41.6 % (ref 36.0–46.0)
Hemoglobin: 13.4 g/dL (ref 12.0–15.0)
Immature Granulocytes: 0 %
Lymphocytes Relative: 25 %
Lymphs Abs: 1.5 10*3/uL (ref 0.7–4.0)
MCH: 26.2 pg (ref 26.0–34.0)
MCHC: 32.2 g/dL (ref 30.0–36.0)
MCV: 81.3 fL (ref 80.0–100.0)
Monocytes Absolute: 0.3 10*3/uL (ref 0.1–1.0)
Monocytes Relative: 6 %
Neutro Abs: 3.6 10*3/uL (ref 1.7–7.7)
Neutrophils Relative %: 64 %
Platelets: 206 10*3/uL (ref 150–400)
RBC: 5.12 MIL/uL — ABNORMAL HIGH (ref 3.87–5.11)
RDW: 14 % (ref 11.5–15.5)
WBC: 5.7 10*3/uL (ref 4.0–10.5)
nRBC: 0 % (ref 0.0–0.2)

## 2023-02-16 LAB — TROPONIN I (HIGH SENSITIVITY)
Troponin I (High Sensitivity): <2 ng/L (ref <18)
Troponin I (High Sensitivity): <2 ng/L (ref <18)

## 2023-02-16 LAB — PREGNANCY, URINE: Preg Test, Ur: NEGATIVE

## 2023-02-16 LAB — BRAIN NATRIURETIC PEPTIDE: B Natriuretic Peptide: 19 pg/mL (ref 0.0–100.0)

## 2023-02-16 LAB — D-DIMER, QUANTITATIVE: D-Dimer, Quant: 0.46 ug/mL-FEU (ref 0.00–0.50)

## 2023-02-16 NOTE — ED Notes (Signed)
Pt ambulatory with steady gait to restroom 

## 2023-02-16 NOTE — ED Notes (Signed)
Pt discharged to home using teachback Method. Discharge instructions have been discussed with patient and/or family members. Pt verbally acknowledges understanding d/c instructions, has been given opportunity for questions to be answered, and endorses comprehension to checkout at registration before leaving.  

## 2023-02-16 NOTE — ED Provider Notes (Signed)
[place] EMERGENCY DEPARTMENT AT [facility] Provider Note   CSN: [id number] Arrival date & time: 02/16/23  3664     History  Chief Complaint  Patient presents with   Chest Pain    Brittany Fritz is a 37 y.o. female.  HPI      Left sided chest pain, feels like pressure and spasms, feels it radiating to left neck, down left arm Feeling fatigue Waking up in the middle of the night short of breath, not feeling short of breath now, when stand up feels better Nausea for 2 days, no vomiting No cough, fever, leg pain or swelling, abdominal pain When moving feels worse, moving, arms, any moving, when sitting down feels better But now not having pain with moving.  Pressure anterior neck and chest Reports she had been doing some weight lifting in the days prior to developing the symptoms.  No smoking, drinking or drugs Father had heart disease in 60s  No long trips, car, airplane, no hx of blood clots Menstruating now, not on birth control  Husband and patient decline interpreter   Past Medical History:  Diagnosis Date   Allergy    Seasonal   BMI 40.0-44.9, adult (HCC)    Chest pain    Gestational diabetes    History of gestational diabetes    Insulin resistance    Medical history non-contributory    Morbid obesity due to excess calories (HCC)      Home Medications Prior to Admission medications   Medication Sig Start Date End Date Taking? Authorizing Provider  ibuprofen (ADVIL) 600 MG tablet Take 1 tablet (600 mg total) by mouth every 6 (six) hours. 07/29/19   Sparacino, Hailey L, DO  metFORMIN (GLUCOPHAGE-XR) 500 MG 24 hr tablet Take 500 mg by mouth daily.    [provider]  predniSONE (STERAPRED UNI-PAK 21 TAB) 10 MG (21) TBPK tablet Take by mouth daily. Take 6 tabs by mouth daily  for 2 days, then 5 tabs for 2 days, then 4 tabs for 2 days, then 3 tabs for 2 days, 2 tabs for 2 days, then 1 tab by mouth daily for 2 days Patient not taking:  Reported on 08/29/2022 08/17/20   Shanon Ace, PA-C  Prenatal Vit-Fe Fum-Fe Bisg-FA (NATACHEW) 28-1 MG CHEW Chew 1 tablet by mouth at bedtime. Patient not taking: Reported on 03/20/2019 04/21/17   Roe Coombs, CNM      Allergies    Lasandra Beech [phentermine hcl] and Penicillins    Review of Systems   Review of Systems  Physical Exam Updated Vital Signs BP 124/79   Pulse 86   Temp 98.6 F (37 C) (Oral)   Resp 18   Wt 100.5 kg   LMP 02/15/2023   SpO2 100%   BMI 38.04 kg/m  Physical Exam Vitals and nursing note reviewed.  Constitutional:      General: She is not in acute distress.    Appearance: She is well-developed. She is not diaphoretic.  HENT:     Head: Normocephalic and atraumatic.  Eyes:     Conjunctiva/sclera: Conjunctivae normal.  Cardiovascular:     Rate and Rhythm: Normal rate and regular rhythm.     Heart sounds: Normal heart sounds. No murmur heard.    No friction rub. No gallop.  Pulmonary:     Effort: Pulmonary effort is normal. No respiratory distress.     Breath sounds: Normal breath sounds. No wheezing or rales.  Abdominal:     General:  There is no distension.     Palpations: Abdomen is soft.     Tenderness: There is no abdominal tenderness. There is no guarding.  Musculoskeletal:        General: No tenderness.     Cervical back: Normal range of motion.     Comments: Normal upper extremity strength  Skin:    General: Skin is warm and dry.     Findings: No erythema or rash.  Neurological:     Mental Status: She is alert and oriented to person, place, and time.     ED Results / Procedures / Treatments   Labs (all labs ordered are listed, but only abnormal results are displayed) Labs Reviewed  CBC WITH DIFFERENTIAL/PLATELET - Abnormal; Notable for the following components:      Result Value   RBC 5.12 (*)    All other components within normal limits  COMPREHENSIVE METABOLIC PANEL - Abnormal; Notable for the following components:    Glucose, Bld 102 (*)    AST 10 (*)    All other components within normal limits  PREGNANCY, URINE  D-DIMER, QUANTITATIVE  BRAIN NATRIURETIC PEPTIDE  TROPONIN I (HIGH SENSITIVITY)  TROPONIN I (HIGH SENSITIVITY)    EKG EKG Interpretation  Date/Time:  Tuesday Feb 16 2023 07:49:17 EDT Ventricular Rate:  82 PR Interval:  126 QRS Duration: 97 QT Interval:  385 QTC Calculation: 450 R Axis:   78 Text Interpretation: Sinus rhythm Low voltage, precordial leads No significant change since last tracing Confirmed by Alvira Monday ([id number]) on 02/16/2023 8:29:48 AM  Radiology DG Chest 2 View  Result Date: 02/16/2023 CLINICAL DATA:  Chest pain EXAM: CHEST - 2 VIEW COMPARISON:  None Available. FINDINGS: The heart size and mediastinal contours are within normal limits. Both lungs are clear. The visualized skeletal structures are unremarkable. IMPRESSION: No radiographic finding to explain chest pain. Electronically Signed   By: Lorenza Cambridge M.D.   On: 02/16/2023 08:03    Procedures Procedures    Medications Ordered in ED Medications - No data to display  ED Course/ Medical Decision Making/ A&P                              37 year old female with history of overweight, gestational diabetes, who presents with concern for chest pain.  Differential diagnosis for chest pain includes pulmonary embolus, dissection, pneumothorax, pneumonia, ACS, myocarditis, pericarditis.    EKG was done and evaluate by me and showed no acute ST changes and no signs of pericarditis.   Chest x-ray was done and evaluated by me and radiology and showed no sign of pneumonia or pneumothorax.   Labs completed and personally evaluated and interpreted by me show no anemia, no leukocytosis, no transaminitis electrolytes within normal limits.  She is low risk Wells with a negative D-dimer and have low suspicion for PE.  BNP within normal limits and doubt CHF.  Troponin less than 2 4.5 hours after symptoms started this  Am and after symptoms for days and doubt ACS.  Pain may be musculoskeletal in setting of recent weight lifting, also may be radicular.  Recommend PCP follow up and discussed strict return precautions.        Final Clinical Impression(s) / ED Diagnoses Final diagnoses:  Chest pain, unspecified type    Rx / DC Orders ED Discharge Orders     None         Alvira Monday, MD 02/16/23 1103

## 2023-02-16 NOTE — ED Triage Notes (Signed)
Pt arrives pov, steady gait with c/o LT side CP that radiates to neck x 2 days. Deneis pain at this time. Endorses LT arm  numbness while sleeping that has resolved. Reports "spasms in LT side neck and shoulder rated at 8/10. Speech clear, VAN -

## 2023-04-18 NOTE — Progress Notes (Signed)
Cardiology Office Note:   Date:  04/20/2023  ID:  Brittany Fritz, DOB 1986-01-31, MRN [id number]  History of Present Illness:   Brittany Fritz is a 37 y.o. female with history of gestational DMII, morbid obesity who was referred by Jarrett Soho for further evaluation of chest pain.  Patient seen at [facility] ER in 01/2023 for chest pain. Notes reviewed. Described left sided chest pressure radiating down her left arm. D-dimer negative. BNP and trop reassuring. ECG nonischemic. Symptoms thought to be MSK in nature following weight lifting.   Today, the patient states that she was having episodes of chest pain radiating to her neck and back in 01/2023. Was seen in the ER, as detailed above with reassuring work-up. Had persistent symptoms for a couple of days after her visit but they have since resolved. Symptoms were not exertional or positional in nature. No current exertional chest pain, SOB, LE edema, orthopnea, or PND. She exercises regularly without exertional symptoms. She is worried about her weight gain following pregnancy and is working hard on diet and exercise but has not lost weight. She is interested in GLP-1 receptor agonist.   Past Medical History:  Diagnosis Date   Allergy    Seasonal   BMI 40.0-44.9, adult (HCC)    Chest pain    Gestational diabetes    History of gestational diabetes    Insulin resistance    Medical history non-contributory    Morbid obesity due to excess calories (HCC)      ROS: As per HPI  Studies Reviewed:    EKG:  NSR with HR 81bpm-personally reviewed       Risk Assessment/Calculations:              Physical Exam:   VS:  BP 112/76   Pulse 93   Ht 5\' 4"  (1.626 m)   Wt 235 lb 6.4 oz (106.8 kg)   SpO2 98%   BMI 40.41 kg/m    Wt Readings from Last 3 Encounters:  04/20/23 235 lb 6.4 oz (106.8 kg)  02/16/23 221 lb 9.6 oz (100.5 kg)  07/27/19 223 lb 15.8 oz (101.6 kg)     GEN: Well nourished, well developed in no acute distress NECK: No  JVD; No carotid bruits CARDIAC: RRR, no murmurs, rubs, gallops RESPIRATORY:  Clear to auscultation without rales, wheezing or rhonchi  ABDOMEN: Soft, non-tender, non-distended EXTREMITIES:  No edema; No deformity   ASSESSMENT AND PLAN:   #Chest Pain: -Reassuring work-up in the ER with normal trop, BNP -ECG nonischemic -Suspect symptoms are noncardiac in nature but will check TTE to ensure no abnormalities -Continue healthy lifestyle modifications as below  #Gestational DM: -Discussed she is higher risk for CV conditions later in life -Will look into coverage of GLP-1 receptor agonist  #Morbid Obesity: -BMI 41 -Continue lifestyle modifications as detailed below -Patient interested in GLP-1 agonist and willing to pay out of pocket; will discuss with Pharm D  Exercise recommendations: Goal of exercising for at least 30 minutes a day, at least 5 times per week.  Please exercise to a moderate exertion.  This means that while exercising it is difficult to speak in full sentences, however you are not so short of breath that you feel you must stop, and not so comfortable that you can carry on a full conversation.  Exertion level should be approximately a 5/10, if 10 is the most exertion you can perform.  Diet recommendations: Recommend a heart healthy diet such as the Mediterranean diet.  This diet consists of plant based foods, healthy fats, lean meats, olive oil.  It suggests limiting the intake of simple carbohydrates such as white breads, pastries, and pastas.  It also limits the amount of red meat, wine, and dairy products such as cheese that one should consume on a daily basis.         Signed, Meriam Sprague, MD

## 2023-04-20 ENCOUNTER — Telehealth: Payer: Self-pay | Admitting: Pharmacist

## 2023-04-20 ENCOUNTER — Ambulatory Visit: Payer: Medicaid Other | Attending: Cardiology | Admitting: Cardiology

## 2023-04-20 ENCOUNTER — Encounter: Payer: Self-pay | Admitting: Cardiology

## 2023-04-20 VITALS — BP 112/76 | HR 93 | Ht 64.0 in | Wt 235.4 lb

## 2023-04-20 DIAGNOSIS — R079 Chest pain, unspecified: Secondary | ICD-10-CM

## 2023-04-20 DIAGNOSIS — Z8632 Personal history of gestational diabetes: Secondary | ICD-10-CM

## 2023-04-20 NOTE — Telephone Encounter (Signed)
Pt interested in starting GLP for weight loss, willing to pay cash if insurance does not cover. Has commercial plan and Medicaid on file. Will submit to prime [employer] primary plan then follow up with pt once coverage is known. She does have history of gestational DM, no CAD.  Zepbound PA key [id number], waiting for clinical questions to submit request.

## 2023-04-20 NOTE — Patient Instructions (Signed)
Medication Instructions:  Your physician recommends that you continue on your current medications as directed. Please refer to the Current Medication list given to you today.  *If you need a refill on your cardiac medications before your next appointment, please call your pharmacy*   Testing/Procedures: ECHO Your physician has requested that you have an echocardiogram. Echocardiography is a painless test that uses sound waves to create images of your heart. It provides your doctor with information about the size and shape of your heart and how well your heart's chambers and valves are working. This procedure takes approximately one hour. There are no restrictions for this procedure. Please do NOT wear cologne, perfume, aftershave, or lotions (deodorant is allowed). Please arrive 15 minutes prior to your appointment time.   Follow-Up: At [facility], you and your health needs are our priority.  As part of our continuing mission to provide you with exceptional heart care, we have created designated Provider Care Teams.  These Care Teams include your primary Cardiologist (physician) and Advanced Practice Providers (APPs -  Physician Assistants and Nurse Practitioners) who all work together to provide you with the care you need, when you need it.  We recommend signing up for the patient portal called "MyChart".  Sign up information is provided on this After Visit Summary.  MyChart is used to connect with patients for Virtual Visits (Telemedicine).  Patients are able to view lab/test results, encounter notes, upcoming appointments, etc.  Non-urgent messages can be sent to your provider as well.   To learn more about what you can do with MyChart, go to [email or web address].    Your next appointment:   6 month(s)  Provider:   Dr Jacques Navy

## 2023-04-21 NOTE — Telephone Encounter (Signed)
Request submitted. Looks like Wegovy/Saxenda may be preferred but will await determination on Zepbound as it's the most effective weight loss medication. 2nd option would be to try for [facility]. If we receive denial that weight loss meds are not covered, can try for [facility] and use gestational DM indication to see if it'll be covered.

## 2023-04-23 NOTE — Telephone Encounter (Addendum)
Prior auth for Zepbound denied - "This health benefit plan does not cover the following services, supplies, drugs or charges: Any treatment or regimen, medical or surgical, for the purpose of reducing or controlling the weight of the member, or for the treatment of obesity, except for surgical treatment of morbid obesity, or as specifically covered by this health benefit plan."  Since plan doesn't cover weight loss meds, will try submitting for [facility] under gestational DM indication. Not sure if it would be covered since GLPs wouldn't be used during pregnancy and her A1c has since returned to normal range. Key: [id number]

## 2023-04-26 NOTE — Telephone Encounter (Signed)
Mounjaro denied for not meeting FDA approved coverage criteria. Called pt to inform her that her insurance does not cover GLPs. Spoke with her husband. He mentioned her insulin resistance. Discussed getting an updated A1c checked, if it came back in the diabetic range then she would have coverage for med. He will discuss with her and call me back to add A1c to our lab schedule if she is interested. Otherwise, he mentioned he is ok with the $550/month Zepbound cash card price.

## 2023-05-13 NOTE — Telephone Encounter (Signed)
Husband called, requested to check A1c, lab ordered. Patient is coming to [facility] office for ECHO on May 14, 2023. Lab app scheduled.

## 2023-05-13 NOTE — Addendum Note (Signed)
Addended by: Tylene Fantasia on: 05/13/2023 09:20 AM   Modules accepted: Orders

## 2023-05-14 ENCOUNTER — Ambulatory Visit (HOSPITAL_COMMUNITY): Payer: Medicaid Other

## 2023-05-14 ENCOUNTER — Ambulatory Visit (HOSPITAL_COMMUNITY): Payer: Medicaid Other | Attending: Internal Medicine

## 2023-05-15 LAB — HEMOGLOBIN A1C
Est. average glucose Bld gHb Est-mCnc: 114 mg/dL
Hgb A1c MFr Bld: 5.6 % (ref 4.8–5.6)

## 2023-05-18 NOTE — Telephone Encounter (Signed)
Spoke with pt's husband. A1c is normal. He will discuss with her potentially trying Zepbound and call me back if she is interested.  I am going to try submitting for [facility] coverage to her medicaid plan as well. It's her secondary plan so typically they respond that we need to submit to her primary insurance, but her primary insurance doesn't cover weight loss GLPs and Medicaid is supposed to now as of July 1. Key [id number].

## 2023-05-19 MED ORDER — WEGOVY 0.25 MG/0.5ML ~~LOC~~ SOAJ: 0.2500 mg | SUBCUTANEOUS | 0 refills | Status: DC

## 2023-05-19 NOTE — Telephone Encounter (Signed)
Prior auth approved through 05/18/24. Prior auth # [id number]. Spoke with pt's husband, pt would like to start [facility]. Rx sent to pharmacy, aware to store med in the fridge, advised him to tell pharmacy to run rx just under [employer]. Appt with PharmD scheduled for this Friday.

## 2023-05-19 NOTE — Addendum Note (Signed)
Addended by: Aarya Quebedeaux E on: 05/19/2023 04:49 PM   Modules accepted: Orders

## 2023-05-21 ENCOUNTER — Ambulatory Visit: Payer: Medicaid Other | Attending: Interventional Cardiology | Admitting: Pharmacist

## 2023-05-21 VITALS — Ht 64.0 in | Wt 237.0 lb

## 2023-05-21 DIAGNOSIS — Z8632 Personal history of gestational diabetes: Secondary | ICD-10-CM

## 2023-05-21 DIAGNOSIS — Z8759 Personal history of other complications of pregnancy, childbirth and the puerperium: Secondary | ICD-10-CM | POA: Diagnosis not present

## 2023-05-21 DIAGNOSIS — O99210 Obesity complicating pregnancy, unspecified trimester: Secondary | ICD-10-CM

## 2023-05-21 NOTE — Patient Instructions (Signed)
GLP-1 Receptor Agonist Counseling Points This medication reduces your appetite and may make you feel fuller longer.  Stop eating when your body tells you that you are full. This will likely happen sooner than you are used to. Fried/greasy food and sweets may upset your stomach - minimize these as much as possible. Store your medication in the fridge until you are ready to use it. Inject your medication in the fatty tissue of your lower abdominal area (2 inches away from belly button) or upper outer thigh. Rotate injection sites. Common side effects include: nausea, diarrhea/constipation, and heartburn, and are more likely to occur if you overeat. Stop your injection for 7 days prior to surgical procedures requiring anesthesia.  Dosing schedule: - Month 1: Inject 0.25 subcutaneously once weekly for 4 weeks - Month 2: Inject 0.5mg   subcutaneously once weekly for 4 weeks - Month 3: Inject 1mg  subcutaneously once weekly for 4 weeks - Month 4: Inject 1.7mg  subcutaneously once weekly for 4 weeks - Month 5: Inject 2.4mg  mg subcutaneously once weekly  Tips for success: Write down the reasons why you want to lose weight and post it in a place where you'll see it often.  Start small and work your way up. Keep in mind that it takes time to achieve goals, and small steps add up.  Any additional movements help to burn calories. Taking the stairs rather than the elevator and parking at the far end of your parking lot are easy ways to start. Brisk walking for at least 30 minutes 4 or more days of the week is an excellent goal to work toward  Understanding what it means to feel full: Did you know that it can take 15 minutes or more for your brain to receive the message that you've eaten? That means that, if you eat less food, but consume it slower, you may still feel satisfied.  Eating a lot of fruits and vegetables can also help you feel fuller.  Eat off of smaller plates so that moderate portions don't  seem too small  Tips for living a healthier life     Building a Healthy and Balanced Diet Make most of your meal vegetables and fruits -  of your plate. Aim for color and variety, and remember that potatoes don't count as vegetables on the Healthy Eating Plate because of their negative impact on blood sugar.  Go for whole grains -  of your plate. Whole and intact grains--whole wheat, barley, wheat berries, quinoa, oats, brown rice, and foods made with them, such as whole wheat pasta--have a milder effect on blood sugar and insulin than white bread, white rice, and other refined grains.  Protein power -  of your plate. Fish, poultry, beans, and nuts are all healthy, versatile protein sources--they can be mixed into salads, and pair well with vegetables on a plate. Limit red meat, and avoid processed meats such as bacon and sausage.  Healthy plant oils - in moderation. Choose healthy vegetable oils like olive, canola, soy, corn, sunflower, peanut, and others, and avoid partially hydrogenated oils, which contain unhealthy trans fats. Remember that low-fat does not mean "healthy."  Drink water, coffee, or tea. Skip sugary drinks, limit milk and dairy products to one to two servings per day, and limit juice to a small glass per day.  Stay active. The red figure running across the Healthy Eating Plate's placemat is a reminder that staying active is also important in weight control.  The main message of the Healthy  Eating Plate is to focus on diet quality:  The type of carbohydrate in the diet is more important than the amount of carbohydrate in the diet, because some sources of carbohydrate--like vegetables (other than potatoes), fruits, whole grains, and beans--are healthier than others. The Healthy Eating Plate also advises consumers to avoid sugary beverages, a major source of calories--usually with little nutritional value--in the American diet. The Healthy Eating Plate encourages  consumers to use healthy oils, and it does not set a maximum on the percentage of calories people should get each day from healthy sources of fat. In this way, the Healthy Eating Plate recommends the opposite of the low-fat message promoted for decades by the USDA.  [employer]  SUGAR  Sugar is a huge problem in the modern day diet. Sugar is a big contributor to heart disease, diabetes, high triglyceride levels, fatty liver disease and obesity. Sugar is hidden in almost all packaged foods/beverages. Added sugar is extra sugar that is added beyond what is naturally found and has no nutritional benefit for your body. The American Heart Association recommends limiting added sugars to no more than 25g for women and 36 grams for men per day. There are many names for sugar including maltose, sucrose (names ending in "ose"), high fructose corn syrup, molasses, cane sugar, corn sweetener, raw sugar, syrup, honey or fruit juice concentrate.   One of the best ways to limit your added sugars is to stop drinking sweetened beverages such as soda, sweet tea, and fruit juice.  There is 65g of added sugars in one 20oz bottle of Coke! That is equal to 7.5 donuts.   Pay attention and read all nutrition facts labels. Below is an examples of a nutrition facts label. The #1 is showing you the total sugars where the # 2 is showing you the added sugars. This one serving has almost the max amount of added sugars per day!   EXERCISE  Exercise is good. We've all heard that. In an ideal world, we would all have time and resources to get plenty of it. When you are active, your heart pumps more efficiently and you will feel better.  Multiple studies show that even walking regularly has benefits that include living a longer life. The American Heart Association recommends 150 minutes per week of exercise (30 minutes per day most days of the week). You can do this in any  increment you wish. Nine or more 10-minute walks count. So does an hour-long exercise class. Break the time apart into what will work in your life. Some of the best things you can do include walking briskly, jogging, cycling or swimming laps. Not everyone is ready to "exercise." Sometimes we need to start with just getting active. Here are some easy ways to be more active throughout the day:  Take the stairs instead of the elevator  Go for a 10-15 minute walk during your lunch break (find a friend to make it more enjoyable)  When shopping, park at the back of the parking lot  If you take public transportation, get off one stop early and walk the extra distance  Pace around while making phone calls  Check with your doctor if you aren't sure what your limitations may be. Always remember to drink plenty of water when doing any type of exercise. Don't feel like a failure if you're not getting the 90-150 minutes per week. If you started by being a couch potato, then just a 10-minute walk each day is a  huge improvement. Start with little victories and work your way up.   HEALTHY EATING TIPS              Plan ahead: make a menu of the meals for a week then create a grocery list to go with that menu. Consider meals that easily stretch into a night of leftovers, such as stews or casseroles. Or consider making two of your favorite meal and put one in the freezer for another night. Try a night or two each week that is "meatless" or "no cook" such as salads. When you get home from the grocery store wash and prepare your vegetables and fruits. Then when you need them they are ready to go.   Tips for going to the grocery store:  Buy store or generic brands  Check the weekly ad from your store on-line or in their in-store flyer  Look at the unit price on the shelf tag to compare/contrast the costs of different items  Buy fruits/vegetables in season  Carrots, bananas and apples are low-cost, naturally healthy  items  If meats or frozen vegetables are on sale, buy some extras and put in your freezer  Limit buying prepared or "ready to eat" items, even if they are pre-made salads or fruit snacks  Do not shop when you're hungry  Foods at eye level tend to be more expensive. Look on the high and low shelves for deals.  Consider shopping at the farmer's market for fresh foods in season.  Avoid the cookie and chip aisles (these are expensive, high in calories and low in nutritional value). Shop on the outside of the grocery store.  Healthy food preparations:  If you can't get lean hamburger, be sure to drain the fat when cooking  Steam, saut (in olive oil), grill or bake foods  Experiment with different seasonings to avoid adding salt to your foods. Kosher salt, sea salt and Himalayan salt are all still salt and should be avoided. Try seasoning food with onion, garlic, thyme, rosemary, basil ect. Onion powder or garlic powder is ok. Avoid if it says salt (ie garlic salt).

## 2023-05-21 NOTE — Progress Notes (Signed)
Patient ID: Brittany Fritz                 DOB: 1986-07-03                    MRN: [id number]      HPI: Brittany Fritz is a 37 y.o. female patient referred to pharmacy clinic by Dr. Shari Prows to initiate weight loss therapy with GLP1-RA. PMH is significant for obesity complicated by chronic medical conditions including gestational DM and chest pain. Most recent BMI 40.6.   Patient presents today with her husband and 3 children. Patient speaks some [place]. Her husband also helps with communication. Patient declines interrupter. Previously seen at weight clinic. She did not loose any weight. Did intermittent fasting. Was put on phentermine which increased her blood pressure. Does exercise, although it sounds like mostly abdominal exercises and light weights.  Had gestational diabetes. A1C is now 5.6. Gained a lot of weight with her last 2 pregnancies and hasn't been able to loose it. Concerned about side effects of medication, especially cancer.  Current weight management medications: none  Previously tried meds: phenteramine.  Baseline weight/BMI:40.6/237 lb  Insurance payor: medicaid  Diet:  -Breakfast: boiled egg, oatmeal  -Lunch: chicken/fish w/ vegetables -Dinner: -Snacks: -Drinks:water, coffee w/ milk  Exercise: reports doing weight machine and ab machine  Family History:  Family History  Problem Relation Age of Onset   Heart disease Father    Stroke Father     Labs: Lab Results  Component Value Date   HGBA1C 5.6 05/14/2023    Wt Readings from Last 1 Encounters:  04/20/23 235 lb 6.4 oz (106.8 kg)    BP Readings from Last 1 Encounters:  04/20/23 112/76   Pulse Readings from Last 1 Encounters:  04/20/23 93       Component Value Date/Time   CHOL 195 01/23/2016 1348   TRIG 107 01/23/2016 1348   HDL 58 01/23/2016 1348    Past Medical History:  Diagnosis Date   Allergy    Seasonal   BMI 40.0-44.9, adult (HCC)    Chest pain    Gestational diabetes    History  of gestational diabetes    Insulin resistance    Medical history non-contributory    Morbid obesity due to excess calories (HCC)     Current Outpatient Medications on File Prior to Visit  Medication Sig Dispense Refill   ibuprofen (ADVIL) 600 MG tablet Take 1 tablet (600 mg total) by mouth every 6 (six) hours. 30 tablet 0   metFORMIN (GLUCOPHAGE-XR) 500 MG 24 hr tablet Take 500 mg by mouth daily.     WEGOVY 0.25 MG/0.5ML SOAJ Inject 0.25 mg into the skin once a week. 2 mL 0   No current facility-administered medications on file prior to visit.    Allergies  Allergen Reactions   Lomaira [Phentermine Hcl]     ELEVATED BP   Penicillins Itching and Rash    Has patient had a PCN reaction causing immediate rash, facial/tongue/throat swelling, SOB or lightheadedness with hypotension: Yes Has patient had a PCN reaction causing severe rash involving mucus membranes or skin necrosis: No Has patient had a PCN reaction that required hospitalization No Has patient had a PCN reaction occurring within the last 10 years: Yes If all of the above answers are "NO", then may proceed with Cephalosporin use.      Assessment/Plan:  1. Weight loss - Patient has not met goal of at least 5% of body weight  loss with comprehensive lifestyle modifications alone in the past 3-6 months. Pharmacotherapy is appropriate to pursue as augmentation. Will start Wegvoy 0.25mg  weekly. Confirmed patient not pregnant and no personal or family history of medullary thyroid carcinoma (MTC) or Multiple Endocrine Neoplasia syndrome type 2 (MEN 2).   Rx was sent to pharmacy a few days ago. Pharmacy said pending. Husband will go by and check on the status and make sure they have her medicaid info. He will call me when she starts. Patient would prefer I call and speak with her husband, not use interrupter.   Advised patient on common side effects including nausea, diarrhea, dyspepsia, decreased appetite, and fatigue. Counseled  patient on reducing meal size and how to titrate medication to minimize side effects. Counseled patient to call if intolerable side effects or if experiencing dehydration, abdominal pain, or dizziness. Patient will adhere to dietary modifications and will target at least 150 minutes of moderate intensity exercise weekly. Recommended patient increase the difficulty of her exercises. The last few reps of the exercise should be very challenging. Reviewed squats, lunges, overhead press. 2-3 days of weight training and 30 min 5 days per week of cardio.  Follow up in 1 month via telephone.  Olene Floss, Pharm.D, BCACP, BCPS, CPP [facility]  [facility] [address] [place], [place] [address]  Phone: [phone]; Fax: [phone]

## 2023-06-02 ENCOUNTER — Ambulatory Visit (HOSPITAL_COMMUNITY): Payer: BLUE CROSS/BLUE SHIELD | Attending: Internal Medicine

## 2023-06-02 DIAGNOSIS — R079 Chest pain, unspecified: Secondary | ICD-10-CM | POA: Diagnosis present

## 2023-06-02 LAB — ECHOCARDIOGRAM COMPLETE
Area-P 1/2: 3.73 cm2
S' Lateral: 2.3 cm

## 2023-06-21 ENCOUNTER — Telehealth: Payer: Self-pay | Admitting: Pharmacist

## 2023-06-21 NOTE — Telephone Encounter (Signed)
I spoke with patients husbad ( per DPR) he said patient took wegovy for 3 weeks, she developed  swelling in her mouth, face around the third week. She stopped the wegovy and is feeling better. Will remain off.

## 2023-08-10 ENCOUNTER — Other Ambulatory Visit: Payer: Self-pay | Admitting: Family Medicine

## 2023-08-10 DIAGNOSIS — N644 Mastodynia: Secondary | ICD-10-CM

## 2023-08-12 ENCOUNTER — Emergency Department (HOSPITAL_BASED_OUTPATIENT_CLINIC_OR_DEPARTMENT_OTHER)
Admission: EM | Admit: 2023-08-12 | Discharge: 2023-08-12 | Disposition: A | Discharge status: Home / Self Care | Payer: BLUE CROSS/BLUE SHIELD | Attending: Emergency Medicine | Admitting: Emergency Medicine

## 2023-08-12 ENCOUNTER — Encounter (HOSPITAL_BASED_OUTPATIENT_CLINIC_OR_DEPARTMENT_OTHER): Payer: Self-pay | Admitting: Emergency Medicine

## 2023-08-12 ENCOUNTER — Emergency Department (HOSPITAL_BASED_OUTPATIENT_CLINIC_OR_DEPARTMENT_OTHER): Payer: BLUE CROSS/BLUE SHIELD

## 2023-08-12 ENCOUNTER — Other Ambulatory Visit: Payer: Self-pay

## 2023-08-12 DIAGNOSIS — M79622 Pain in left upper arm: Secondary | ICD-10-CM | POA: Diagnosis present

## 2023-08-12 NOTE — Discharge Instructions (Signed)
You have been seen and discharged from the emergency department.  The EKG of your heart was normal.  The ultrasound of the arm showed normal blood flow, no blood clots or other acute findings.  It is important to follow-up with the mammogram this Saturday for further evaluation.  Then follow-up with your primary doctor for further guidance.  Take home medications as prescribed. If you have any worsening symptoms or further concerns for your health please return to an emergency department for further evaluation.

## 2023-08-12 NOTE — ED Provider Notes (Signed)
Brittany Fritz Provider Note   CSN: [id number] Arrival date & time: 08/12/23  4098     History  Chief Complaint  Patient presents with   Arm Pain     Pt denies breat pain. Husband inquiring about mammogram and was explained that not done here in ED    Brittany Fritz is a 37 y.o. female.  HPI   37 year old female presents emergency department with left upper extremity discomfort.  Patient states this has been ongoing for the past month.  Recently she had a flu/viral-like illness and felt like the pain in the left axillary area was worse.  She never had any prominent swelling, redness, drainage.  At 1 point the pain did radiate into the left breast but currently denies any chest pain or shortness of breath.  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  Otherwise she denies any persistent numbness, weakness or dysfunction of the left upper extremity.  Home Medications Prior to Admission medications   Medication Sig Start Date End Date Taking? Authorizing Provider  ergocalciferol (VITAMIN D2) 1.25 MG (50000 UT) capsule Take 50,000 Units by mouth once a week. 08/12/23  Yes [provider]  ibuprofen (ADVIL) 600 MG tablet Take 1 tablet (600 mg total) by mouth every 6 (six) hours. 07/29/19   Sparacino, Hailey L, DO  metFORMIN (GLUCOPHAGE-XR) 500 MG 24 hr tablet Take 500 mg by mouth daily.    [provider]      Allergies    Lasandra Beech [phentermine hcl], Semaglutide, and Penicillins    Review of Systems   Review of Systems  Constitutional:  Negative for fever.  Respiratory:  Negative for shortness of breath.   Cardiovascular:  Negative for chest pain and leg swelling.  Gastrointestinal:  Negative for abdominal pain, diarrhea and vomiting.  Musculoskeletal:        LUE (bicep and axillary) pain  Skin:  Negative for rash.  Neurological:  Negative for  headaches.    Physical Exam Updated Vital Signs BP (!) 126/91   Pulse 90   Temp 98.2 F (36.8 C) (Oral)   Resp 16   LMP 07/29/2023   SpO2 99%  Physical Exam Vitals and nursing note reviewed.  Constitutional:      Appearance: Normal appearance.  HENT:     Head: Normocephalic.     Mouth/Throat:     Mouth: Mucous membranes are moist.  Cardiovascular:     Rate and Rhythm: Normal rate.  Pulmonary:     Effort: Pulmonary effort is normal. No respiratory distress.  Abdominal:     Palpations: Abdomen is soft.     Tenderness: There is no abdominal tenderness.  Musculoskeletal:     Comments: Left upper extremity is currently neurovascularly intact, there is no rash or any other overlying skin changes involving the left axillary, left breast or left flank.  There is some mild reproducible tenderness in the mid axillary area without palpated mass/lymphadenopathy.  The left breast is soft without any obvious palpated masses.  Skin:    General: Skin is warm.  Neurological:     Mental Status: She is alert and oriented to person, place, and time. Mental status is at baseline.  Psychiatric:        Mood and Affect: Mood normal.     ED Results / Procedures / Treatments   Labs (all labs ordered are listed, but only abnormal results are displayed)  Labs Reviewed - No data to display  EKG None  Radiology No results found.  Procedures Procedures    Medications Ordered in ED Medications - No data to display  ED Course/ Medical Decision Making/ A&P                                 Medical Decision Making  37 year old female presents emergency department with left axillary pain that radiates to the left arm and sometimes left breast.  This has been ongoing for the past month.  She has not noted any arm swelling or redness/infection or drainage from the left armpit.  Vitals are normal and stable on arrival.  The left upper extremity is neurovascularly intact.  There is some mild  intermittent tenderness to palpation of the axillary area but no noted cyst, abscess or overlying cellulitis.  The left breast is soft without any obvious palpated masses.  With the pain radiating to the left breast/chest and EKG was done which is normal for the patient.  Very low suspicion for ACS.  There is no findings of axillary abscess or infection.  Ultrasound shows no blood clot or other acute finding.  She has scheduled mammogram for this weekend which I encouraged her to follow-up with.  Otherwise we will refer outpatient for further guidance with a primary doctor.  Patient at this time appears safe and stable for discharge and close outpatient follow up. Discharge plan and strict return to ED precautions discussed, patient verbalizes understanding and agreement.        Final Clinical Impression(s) / ED Diagnoses Final diagnoses:  None    Rx / DC Orders ED Discharge Orders     None         Rozelle Logan, DO 08/12/23 1157

## 2023-08-12 NOTE — ED Triage Notes (Signed)
Pt arrives with spouse who provides information. States pt has LT side axillary swelling x 1 month. Also endorses since having flu x 2 weeks pta, has had LT arm and LT side pain.

## 2023-08-12 NOTE — ED Notes (Signed)
Pt denies breat pain. Husband inquiring about mammogram and was explained that not done here in ED. (Note from registration)

## 2023-08-18 ENCOUNTER — Encounter (HOSPITAL_BASED_OUTPATIENT_CLINIC_OR_DEPARTMENT_OTHER): Payer: Self-pay | Admitting: Emergency Medicine

## 2023-08-18 ENCOUNTER — Emergency Department (HOSPITAL_BASED_OUTPATIENT_CLINIC_OR_DEPARTMENT_OTHER)
Admission: EM | Admit: 2023-08-18 | Discharge: 2023-08-18 | Disposition: A | Discharge status: Home / Self Care | Payer: BLUE CROSS/BLUE SHIELD | Attending: Emergency Medicine | Admitting: Emergency Medicine

## 2023-08-18 ENCOUNTER — Other Ambulatory Visit: Payer: Self-pay

## 2023-08-18 DIAGNOSIS — R42 Dizziness and giddiness: Secondary | ICD-10-CM | POA: Diagnosis present

## 2023-08-18 DIAGNOSIS — D509 Iron deficiency anemia, unspecified: Secondary | ICD-10-CM | POA: Diagnosis not present

## 2023-08-18 LAB — CBC
HCT: 41.7 % (ref 36.0–46.0)
Hemoglobin: 13.5 g/dL (ref 12.0–15.0)
MCH: 25.8 pg — ABNORMAL LOW (ref 26.0–34.0)
MCHC: 32.4 g/dL (ref 30.0–36.0)
MCV: 79.7 fL — ABNORMAL LOW (ref 80.0–100.0)
Platelets: 242 10*3/uL (ref 150–400)
RBC: 5.23 MIL/uL — ABNORMAL HIGH (ref 3.87–5.11)
RDW: 14.4 % (ref 11.5–15.5)
WBC: 8.9 10*3/uL (ref 4.0–10.5)
nRBC: 0 % (ref 0.0–0.2)

## 2023-08-18 LAB — BASIC METABOLIC PANEL
Anion gap: 10 (ref 5–15)
BUN: 9 mg/dL (ref 6–20)
CO2: 25 mmol/L (ref 22–32)
Calcium: 9.5 mg/dL (ref 8.9–10.3)
Chloride: 102 mmol/L (ref 98–111)
Creatinine, Ser: 0.49 mg/dL (ref 0.44–1.00)
GFR, Estimated: >60 mL/min (ref >60)
Glucose, Bld: 113 mg/dL — ABNORMAL HIGH (ref 70–99)
Potassium: 4 mmol/L (ref 3.5–5.1)
Sodium: 137 mmol/L (ref 135–145)

## 2023-08-18 LAB — URINALYSIS, ROUTINE W REFLEX MICROSCOPIC
Bacteria, UA: NONE SEEN
Bilirubin Urine: NEGATIVE
Glucose, UA: NEGATIVE mg/dL
Ketones, ur: NEGATIVE mg/dL
Nitrite: NEGATIVE
Specific Gravity, Urine: 1.032 — ABNORMAL HIGH (ref 1.005–1.030)
pH: 5.5 (ref 5.0–8.0)

## 2023-08-18 LAB — PREGNANCY, URINE: Preg Test, Ur: NEGATIVE

## 2023-08-18 MED ORDER — IRON (FERROUS SULFATE) 325 (65 FE) MG PO TABS: 1.0000 | ORAL_TABLET | ORAL | 0 refills | Status: DC

## 2023-08-18 NOTE — Discharge Instructions (Addendum)
Please follow-up with your primary care doctor, if you do not have a primary care doctor, use the number below, to help facilitate getting a new primary care doctor, or you can follow-up with the [place] [facility].  Additionally I have started you on some iron supplementation, please take this every other day, and it will help with your iron deficiency.  Your labs are reassuring as well today.  Make sure you are drinking lots of fluids, and resting

## 2023-08-18 NOTE — ED Notes (Signed)
 RN reviewed discharge instructions with pt. Pt verbalized understanding and had no further questions. VSS upon discharge.  

## 2023-08-18 NOTE — ED Notes (Signed)
Pt ambulatory to restroom at this time. 

## 2023-08-18 NOTE — ED Provider Notes (Signed)
[place] EMERGENCY DEPARTMENT AT [facility] Provider Note   CSN: [id number] Arrival date & time: 08/18/23  1457     History  Chief Complaint  Patient presents with   Dizziness    Brittany Fritz is a 37 y.o. female, history history of iron deficiency anemia, who presents to the ED secondary to an episode where she felt very foggy while she is driving her car, and like she is going to pass out.  Called her husband because she felt unwell, and came to the ER.  Denies any nausea, vomiting, abdominal pain, chest pain.  No urinary symptoms.  Has been eating and drinking fine.  States that the dizziness is worse when getting up, walking.  Denies any ear pain, neck pain, or headache.  States that she was prescribed iron infusion, and but has not started on iron pills yet.  Home Medications Prior to Admission medications   Medication Sig Start Date End Date Taking? Authorizing Provider  Iron, Ferrous Sulfate, 325 (65 Fe) MG TABS Take 1 tablet by mouth every other day. 08/18/23  Yes Courteney Alderete L, PA  ergocalciferol (VITAMIN D2) 1.25 MG (50000 UT) capsule Take 50,000 Units by mouth once a week. 08/12/23   [provider]  ibuprofen (ADVIL) 600 MG tablet Take 1 tablet (600 mg total) by mouth every 6 (six) hours. 07/29/19   Sparacino, Hailey L, DO  metFORMIN (GLUCOPHAGE-XR) 500 MG 24 hr tablet Take 500 mg by mouth daily.    [provider]      Allergies    Lasandra Beech [phentermine hcl], Semaglutide, and Penicillins    Review of Systems   Review of Systems  Neurological:  Positive for dizziness.    Physical Exam Updated Vital Signs BP 113/79   Pulse 77   Temp 97.6 F (36.4 C)   Resp 18   Ht 5\' 4"  (1.626 m)   Wt 107.5 kg   LMP 07/29/2023 (Approximate)   SpO2 97%   BMI 40.68 kg/m  Physical Exam Vitals and nursing note reviewed.  Constitutional:      General: She is not in acute distress.    Appearance: She is well-developed.  HENT:     Head:  Normocephalic and atraumatic.  Eyes:     Conjunctiva/sclera: Conjunctivae normal.  Cardiovascular:     Rate and Rhythm: Normal rate and regular rhythm.     Heart sounds: No murmur heard. Pulmonary:     Effort: Pulmonary effort is normal. No respiratory distress.     Breath sounds: Normal breath sounds.  Abdominal:     Palpations: Abdomen is soft.     Tenderness: There is no abdominal tenderness.  Musculoskeletal:        General: No swelling.     Cervical back: Neck supple.  Skin:    General: Skin is warm and dry.     Capillary Refill: Capillary refill takes less than 2 seconds.  Neurological:     Mental Status: She is alert.  Psychiatric:        Mood and Affect: Mood normal.     ED Results / Procedures / Treatments   Labs (all labs ordered are listed, but only abnormal results are displayed) Labs Reviewed  BASIC METABOLIC PANEL - Abnormal; Notable for the following components:      Result Value   Glucose, Bld 113 (*)    All other components within normal limits  CBC - Abnormal; Notable for the following components:   RBC 5.23 (*)  MCV 79.7 (*)    MCH 25.8 (*)    All other components within normal limits  URINALYSIS, ROUTINE W REFLEX MICROSCOPIC - Abnormal; Notable for the following components:   APPearance HAZY (*)    Specific Gravity, Urine 1.032 (*)    Hgb urine dipstick TRACE (*)    Protein, ur TRACE (*)    Leukocytes,Ua Demri Poulton (*)    All other components within normal limits  PREGNANCY, URINE  CBG MONITORING, ED    EKG None  Radiology No results found.  Procedures Procedures    Medications Ordered in ED Medications - No data to display  ED Course/ Medical Decision Making/ A&P                                 Medical Decision Making Patient is a 37 year old female, here for lightheadedness, driving, worse when getting up and walking.  She has no ear pain, no headache.  Is overall well-appearing EKG, blood work ordered, as well as Barista.   She has a history of microcytic anemia, which is not currently being treated.  Is declining an IV transfusion, for iron, per her primary care doctor's request, we will send her home on p.o. iron  Amount and/or Complexity of Data Reviewed Labs: ordered.    Details: Unremarkable except for a slight haziness of her urine Radiology:     Details: Sinus tachycardia ECG/medicine tests:     Details: Sinus tachycardia Discussion of management or test interpretation with external provider(s): Orthostatics are within normal limits, she is well-appearing there is no focal deficits, and she is not dizzy any longer.  Sure TMs are within normal limits, and she has no headache, this I think this is unlikely to be a vertiginous migraine.  She did have some slight haziness of her urine, and slight ketones, this may indicate some type of dehydration, we discussed the importance of hydration, and supplementing the microcytic anemia, with iron.  I prescribed her some iron pills, to help with this, and may increase her strength.  We discharged her home with strict return precautions with her primary care doctor.  She denies any shortness of breath, abdominal pain or chest pain for me  Risk OTC drugs.    Final Clinical Impression(s) / ED Diagnoses Final diagnoses:  Microcytic anemia  Lightheadedness    Rx / DC Orders ED Discharge Orders          Ordered    Iron, Ferrous Sulfate, 325 (65 Fe) MG TABS  Every other day        08/18/23 1907              Pete Pelt, [place] 08/18/23 1911    Gwyneth Sprout, MD 08/23/23 1156

## 2023-08-18 NOTE — ED Triage Notes (Signed)
Pt via pov from home with husband, who speaks for her. He states that she felt dizzy while driving to pick up children from school and called him to come and get her. She was seen last week at pcp office and told her iron was low and she would be referred to clinic for infusion, but they haven't heard anything yet. Pt alert & oriented, nad noted.

## 2023-09-06 ENCOUNTER — Ambulatory Visit (HOSPITAL_BASED_OUTPATIENT_CLINIC_OR_DEPARTMENT_OTHER): Payer: BLUE CROSS/BLUE SHIELD | Admitting: Family Medicine

## 2023-09-23 ENCOUNTER — Encounter (HOSPITAL_BASED_OUTPATIENT_CLINIC_OR_DEPARTMENT_OTHER): Payer: Self-pay | Admitting: Family Medicine

## 2023-09-23 ENCOUNTER — Ambulatory Visit (INDEPENDENT_AMBULATORY_CARE_PROVIDER_SITE_OTHER): Payer: BLUE CROSS/BLUE SHIELD | Admitting: Family Medicine

## 2023-09-23 VITALS — BP 109/75 | HR 84 | Ht 64.57 in | Wt 239.0 lb

## 2023-09-23 DIAGNOSIS — H539 Unspecified visual disturbance: Secondary | ICD-10-CM

## 2023-09-23 DIAGNOSIS — D509 Iron deficiency anemia, unspecified: Secondary | ICD-10-CM | POA: Diagnosis not present

## 2023-09-23 DIAGNOSIS — E559 Vitamin D deficiency, unspecified: Secondary | ICD-10-CM | POA: Insufficient documentation

## 2023-09-23 DIAGNOSIS — Z8632 Personal history of gestational diabetes: Secondary | ICD-10-CM

## 2023-09-23 DIAGNOSIS — Z23 Encounter for immunization: Secondary | ICD-10-CM | POA: Diagnosis not present

## 2023-09-23 DIAGNOSIS — Z7689 Persons encountering health services in other specified circumstances: Secondary | ICD-10-CM

## 2023-09-23 NOTE — Progress Notes (Addendum)
New Patient Office Visit  Subjective:   Brittany Fritz 05-26-1986 09/23/2023  Chief Complaint  Patient presents with   New Patient (Initial Visit)    Patient is here today to get established with the practice.     HPI: Brittany Fritz presents today to establish care at [facility] at [facility]. Introduced to Publishing rights manager role and practice setting.  All questions answered.    Patient went to the ER on 08/18/23 for dizziness and concern for iron deficiency anemia. Patient had full cardiac workup in ER which was unremarkable. She was started on daily iron replacement but was unable to tolerate prescription tablet and is now taking OTC iron replacement. She was also started on weekly Vitamin D replacement for vitamin D deficiency. She is asymptomatic today. Reports a hx of gestational diabetes but does not take Metformin or check glucose levels at this time. Denies polyuria, polyphagia or polydipsia.   She is establishing with OBGYN in Jan 2025.    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 Would like referral to eye doctor for further evaluation.   The following portions of the patient's history were reviewed and updated as appropriate: past medical history, past surgical history, family history, social history, allergies, medications, and problem list.   Patient Active Problem List   Diagnosis Date Noted   Vaginal bleeding in pregnancy, third trimester 07/27/2019   NSVD (normal spontaneous vaginal delivery) 07/27/2019   Hemorrhoids during pregnancy 07/16/2019   Gestational diabetes mellitus 05/22/2019   GBS bacteriuria 03/27/2019   Maternal obesity, antepartum  03/20/2019   Supervision of other normal pregnancy, antepartum 03/03/2019   History of gestational diabetes mellitus (GDM) in prior pregnancy, currently pregnant 12/17/2016   Immigrant with language difficulty 01/23/2016   Past Medical History:  Diagnosis Date   Allergy    Seasonal   BMI 40.0-44.9, adult (HCC)    Chest pain    Gestational diabetes    History of gestational diabetes    Insulin resistance    Medical history non-contributory    Morbid obesity due to excess calories (HCC)    Past Surgical History:  Procedure Laterality Date   NO PAST SURGERIES     Family History  Problem Relation Age of Onset   Heart disease Father    Stroke Father    Social History   Socioeconomic History   Marital status: Married    Spouse name: Not on file   Number of children: Not on file   Years of education: Not on file   Highest education level: Not on file  Occupational History   Occupation: unemployed  Tobacco Use   Smoking status: Never   Smokeless tobacco: Never  Vaping Use   Vaping status: Never Used  Substance and Sexual Activity   Alcohol use: No   Drug use: No   Sexual activity: Yes    Birth control/protection: None  Other Topics Concern   Not on file  Social History Narrative   Not on file   Social Drivers of Health   Financial Resource Strain: Low Risk  (09/23/2023)   Overall Financial Resource Strain (CARDIA)    Difficulty of Paying Living Expenses: Not hard  at all  Food Insecurity: No Food Insecurity (09/23/2023)   Hunger Vital Sign    Worried About Running Out of Food in the Last Year: Never true    Ran Out of Food in the Last Year: Never true  Transportation Needs: No Transportation Needs (09/23/2023)   PRAPARE - Administrator, Civil Service (Medical): No    Lack of Transportation (Non-Medical): No  Physical Activity: Inactive (09/23/2023)   Exercise Vital Sign    Days of Exercise per Week: 0 days    Minutes of Exercise per Session: 0 min   Stress: No Stress Concern Present (09/23/2023)   [employer] of Occupational Health - Occupational Stress Questionnaire    Feeling of Stress : Not at all  Social Connections: Moderately Isolated (09/23/2023)   Social Connection and Isolation Panel [NHANES]    Frequency of Communication with Friends and Family: More than three times a week    Frequency of Social Gatherings with Friends and Family: Never    Attends Religious Services: Never    Database administrator or Organizations: No    Attends Banker Meetings: Never    Marital Status: Married  Catering manager Violence: Not At Risk (09/23/2023)   Humiliation, Afraid, Rape, and Kick questionnaire    Fear of Current or Ex-Partner: No    Emotionally Abused: No    Physically Abused: No    Sexually Abused: No   Outpatient Medications Prior to Visit  Medication Sig Dispense Refill   ergocalciferol (VITAMIN D2) 1.25 MG (50000 UT) capsule Take 50,000 Units by mouth once a week.     ibuprofen (ADVIL) 600 MG tablet Take 1 tablet (600 mg total) by mouth every 6 (six) hours. 30 tablet 0   Iron, Ferrous Sulfate, 325 (65 Fe) MG TABS Take 1 tablet by mouth every other day. (Patient not taking: Reported on 09/23/2023) 30 tablet 0   metFORMIN (GLUCOPHAGE-XR) 500 MG 24 hr tablet Take 500 mg by mouth daily. (Patient not taking: Reported on 09/23/2023)     No facility-administered medications prior to visit.   Allergies  Allergen Reactions   Lomaira [Phentermine Hcl]     ELEVATED BP   Semaglutide Swelling    Gum and cheek swelling   Penicillins Itching and Rash    Has patient had a PCN reaction causing immediate rash, facial/tongue/throat swelling, SOB or lightheadedness with hypotension: Yes Has patient had a PCN reaction causing severe rash involving mucus membranes or skin necrosis: No Has patient had a PCN reaction that required hospitalization No Has patient had a PCN reaction occurring within the last 10 years: Yes If  all of the above answers are "NO", then may proceed with Cephalosporin use.     ROS: A complete ROS was performed with pertinent positives/negatives noted in the HPI. The remainder of the ROS are negative.   Objective:   Today's Vitals   09/23/23 0833  BP: 109/75  Pulse: 84  SpO2: 100%  Weight: 239 lb (108.4 kg)  Height: 5' 4.57" (1.64 m)    GENERAL: Well-appearing, in NAD. Well nourished.  SKIN: Pink, warm and dry. No rash, lesion, ulceration, or ecchymoses.  Head: Normocephalic. NECK: Trachea midline. Full ROM w/o pain or tenderness.  RESPIRATORY: Chest wall symmetrical. Respirations even and non-labored. Breath sounds clear to auscultation bilaterally.  CARDIAC: S1, S2 present, regular rate and rhythm without murmur or gallops. Peripheral pulses 2+ bilaterally.  MSK: Muscle tone and strength appropriate for age.  NEUROLOGIC: No motor  or sensory deficits. Steady, even gait. C2-C12 intact.  PSYCH/MENTAL STATUS: Alert, oriented x 3. Cooperative, appropriate mood and affect.    Health Maintenance Due  Topic Date Due   INFLUENZA VACCINE  04/29/2023    No results found for any visits on 09/23/23.     Assessment & Plan:  1. Encounter to establish care with new doctor (Primary) Discussed role of PCP with patient and need for AE w/ fastling labs. Pt is fasting today and will obtain lab work. Pt will complete pap w/ OBGYN in Jan 2025.   2. History of gestational diabetes mellitus  Discussed possible T2DM due to hx of gestational DM and proper nutrition, exercise. Will obtian A1C and thyroid post partum to evaluate for disease.  - Comprehensive metabolic panel - Lipid panel - TSH - Hemoglobin A1c  3. Iron deficiency anemia, unspecified iron deficiency anemia type Pt to continue on daily OTC iron supplement and will assess levels w/ labs today.  - CBC with Differential/Platelet - Iron, TIBC and Ferritin Panel  4. Vitamin D deficiency Will prescribe supplement pending Vit d  level w/ lab draw today.  - VITAMIN D 25 Hydroxy (Vit-D Deficiency, Fractures)  5. Immunization due - Flu vaccine trivalent PF, 6mos and older(Flulaval,Afluria,Fluarix,Fluzone)  6. Vision changes Possibly age related changes to vision, will refer to optometry for further evaluation.  - Ambulatory referral to Optometry   Patient to reach out to office if new, worrisome, or unresolved symptoms arise or if no improvement in patient's condition. Patient verbalized understanding and is agreeable to treatment plan. All questions answered to patient's satisfaction.    Return in about 6 weeks (around 11/04/2023) for ANNUAL PHYSICAL.    Hilbert Bible, [place]

## 2023-09-23 NOTE — Addendum Note (Signed)
Addended byJerre Simon on: 09/23/2023 09:21 AM   Modules accepted: Orders

## 2023-09-24 LAB — COMPREHENSIVE METABOLIC PANEL
ALT: 13 [IU]/L (ref 0–32)
AST: 14 [IU]/L (ref 0–40)
Albumin: 4.2 g/dL (ref 3.9–4.9)
Alkaline Phosphatase: 103 [IU]/L (ref 44–121)
BUN/Creatinine Ratio: 16 (ref 9–23)
BUN: 9 mg/dL (ref 6–20)
Bilirubin Total: <0.2 mg/dL (ref 0.0–1.2)
CO2: 23 mmol/L (ref 20–29)
Calcium: 9.1 mg/dL (ref 8.7–10.2)
Chloride: 104 mmol/L (ref 96–106)
Creatinine, Ser: 0.57 mg/dL (ref 0.57–1.00)
Globulin, Total: 2.4 g/dL (ref 1.5–4.5)
Glucose: 101 mg/dL — ABNORMAL HIGH (ref 70–99)
Potassium: 4.8 mmol/L (ref 3.5–5.2)
Sodium: 143 mmol/L (ref 134–144)
Total Protein: 6.6 g/dL (ref 6.0–8.5)
eGFR: 121 mL/min/{1.73_m2} (ref >59)

## 2023-09-24 LAB — IRON,TIBC AND FERRITIN PANEL
Ferritin: 55 ng/mL (ref 15–150)
Iron Saturation: 13 % — ABNORMAL LOW (ref 15–55)
Iron: 45 ug/dL (ref 27–159)
Total Iron Binding Capacity: 348 ug/dL (ref 250–450)
UIBC: 303 ug/dL (ref 131–425)

## 2023-09-24 LAB — CBC WITH DIFFERENTIAL/PLATELET
Basophils Absolute: 0.1 10*3/uL (ref 0.0–0.2)
Basos: 1 %
EOS (ABSOLUTE): 0.5 10*3/uL — ABNORMAL HIGH (ref 0.0–0.4)
Eos: 7 %
Hematocrit: 43.1 % (ref 34.0–46.6)
Hemoglobin: 14 g/dL (ref 11.1–15.9)
Immature Grans (Abs): 0 10*3/uL (ref 0.0–0.1)
Immature Granulocytes: 0 %
Lymphocytes Absolute: 2 10*3/uL (ref 0.7–3.1)
Lymphs: 31 %
MCH: 26.7 pg (ref 26.6–33.0)
MCHC: 32.5 g/dL (ref 31.5–35.7)
MCV: 82 fL (ref 79–97)
Monocytes Absolute: 0.4 10*3/uL (ref 0.1–0.9)
Monocytes: 6 %
Neutrophils Absolute: 3.5 10*3/uL (ref 1.4–7.0)
Neutrophils: 55 %
Platelets: 215 10*3/uL (ref 150–450)
RBC: 5.25 x10E6/uL (ref 3.77–5.28)
RDW: 14.8 % (ref 11.7–15.4)
WBC: 6.5 10*3/uL (ref 3.4–10.8)

## 2023-09-24 LAB — LIPID PANEL
Chol/HDL Ratio: 4.4 {ratio} (ref 0.0–4.4)
Cholesterol, Total: 210 mg/dL — ABNORMAL HIGH (ref 100–199)
HDL: 48 mg/dL (ref >39)
LDL Chol Calc (NIH): 129 mg/dL — ABNORMAL HIGH (ref 0–99)
Triglycerides: 187 mg/dL — ABNORMAL HIGH (ref 0–149)
VLDL Cholesterol Cal: 33 mg/dL (ref 5–40)

## 2023-09-24 LAB — TSH: TSH: 2.27 u[IU]/mL (ref 0.450–4.500)

## 2023-09-24 LAB — HEMOGLOBIN A1C
Est. average glucose Bld gHb Est-mCnc: 111 mg/dL
Hgb A1c MFr Bld: 5.5 % (ref 4.8–5.6)

## 2023-09-24 LAB — VITAMIN D 25 HYDROXY (VIT D DEFICIENCY, FRACTURES): Vit D, 25-Hydroxy: 43.3 ng/mL (ref 30.0–100.0)

## 2023-10-26 ENCOUNTER — Ambulatory Visit: Payer: BLUE CROSS/BLUE SHIELD | Admitting: Advanced Practice Midwife

## 2023-10-26 ENCOUNTER — Encounter (HOSPITAL_BASED_OUTPATIENT_CLINIC_OR_DEPARTMENT_OTHER): Payer: Self-pay | Admitting: Family Medicine

## 2023-10-29 ENCOUNTER — Other Ambulatory Visit (HOSPITAL_COMMUNITY)
Admission: RE | Admit: 2023-10-29 | Discharge: 2023-10-29 | Disposition: A | Discharge status: Home / Self Care | Payer: Medicaid Other | Source: Ambulatory Visit | Attending: Obstetrics and Gynecology | Admitting: Obstetrics and Gynecology

## 2023-10-29 ENCOUNTER — Ambulatory Visit: Payer: Medicaid Other | Admitting: Obstetrics and Gynecology

## 2023-10-29 ENCOUNTER — Encounter: Payer: Self-pay | Admitting: Obstetrics and Gynecology

## 2023-10-29 VITALS — BP 128/84 | HR 96 | Wt 246.6 lb

## 2023-10-29 DIAGNOSIS — Z01419 Encounter for gynecological examination (general) (routine) without abnormal findings: Secondary | ICD-10-CM | POA: Diagnosis present

## 2023-10-29 DIAGNOSIS — N898 Other specified noninflammatory disorders of vagina: Secondary | ICD-10-CM | POA: Diagnosis present

## 2023-10-29 NOTE — Progress Notes (Signed)
Pt. presents for annual. Pt. Is having white discharge, itching, burning, and odor.

## 2023-10-29 NOTE — Progress Notes (Signed)
ANNUAL EXAM Patient name: Brittany Fritz MRN [id number]  Date of birth: 19-Jan-1986 Chief Complaint:   Annual Exam  History of Present Illness:   Brittany Fritz is a 38 y.o. [phone] with Patient's last menstrual period was 10/15/2023 (approximate). being seen today for a routine annual exam.  Current complaints:   Vaginal discharge with itching/odor Last period was newly heavy   Upstream - 10/29/23 1124       Pregnancy Intention Screening   Does the patient want to become pregnant in the next year? No    Does the patient's partner want to become pregnant in the next year? Unsure    Would the patient like to discuss contraceptive options today? No      Contraception Wrap Up   Current Method Withdrawal or Other Method    End Method Withdrawal or Other Method   cycle tracking - encouraged PNV   Contraception Counseling Provided No    How was the end contraceptive method provided? N/A            The pregnancy intention screening data noted above was reviewed. Potential methods of contraception were discussed. The patient elected to proceed with Withdrawal or Other Method (cycle tracking - encouraged PNV).   Last pap 2018. Results were:  NILM . H/O abnormal pap: no Last mammogram: n/a. Results were: N/A. Family h/o breast cancer: no Last colonoscopy: n/a. Results were: N/A. Family h/o colorectal cancer: no HPV vaccine: unsure     09/23/2023    8:41 AM  Depression screen PHQ 2/9  Decreased Interest 0  Down, Depressed, Hopeless 0  PHQ - 2 Score 0  Altered sleeping 0  Tired, decreased energy 2  Change in appetite 0  Feeling bad or failure about yourself  0  Trouble concentrating 0  Moving slowly or fidgety/restless 0  Suicidal thoughts 0  PHQ-9 Score 2  Difficult doing work/chores Not difficult at all        09/23/2023    8:42 AM  GAD 7 : Generalized Anxiety Score  Nervous, Anxious, on Edge 0  Control/stop worrying 0  Worry too much - different things 0  Trouble  relaxing 0  Restless 0  Easily annoyed or irritable 0  Afraid - awful might happen 0  Total GAD 7 Score 0  Anxiety Difficulty Not difficult at all     Review of Systems:   Pertinent items are noted in HPI Denies any headaches, blurred vision, fatigue, shortness of breath, chest pain, abdominal pain, abnormal vaginal discharge/itching/odor/irritation, problems with periods, bowel movements, urination, or intercourse unless otherwise stated above. Pertinent History Reviewed:  Reviewed past medical,surgical, social and family history.  Reviewed problem list, medications and allergies. Physical Assessment:   Vitals:   10/29/23 1051  BP: 128/84  Pulse: 96  Weight: 246 lb 9.6 oz (111.9 kg)  Body mass index is 41.59 kg/m.        Physical Examination:   General appearance - well appearing, and in no distress  Mental status - alert, oriented to person, place, and time  Chest - respiratory effort normal  Heart - normal peripheral perfusion  Breasts - deferred per pt request  Abdomen - soft, nontender, nondistended, no masses or organomegaly  Pelvic - VULVA: normal appearing vulva with no masses, tenderness or lesions  VAGINA: normal appearing vagina with normal color and discharge, no lesions  CERVIX: normal appearing cervix without abnormal discharge or lesions, no CMT  Thin prep pap is done with HR HPV cotesting  Chaperone present for exam  No results found for this or any previous visit (from the past 24 hours).  Assessment & Plan:  1) Well-Woman Exam Mammogram: @ 36yo, or sooner if problems Colonoscopy: @ 36yo, or sooner if problems Pap: Collected today Gardasil: Information provided GC/CT: Collected d/t discharge HIV/HCV: Declines Cycle tracking for contraception Return to care prn if heavy cycles become bothersome/persistent  2) Vaginal discharge Swab sent  Follow-up: Return in about 1 year (around 10/28/2024) for annual exam or sooner as needed.  Lennart Pall,  MD 10/29/2023 11:55 AM

## 2023-10-29 NOTE — Patient Instructions (Signed)
 It was nice meeting you today. You will see your results in the MyChart app within 1 week.

## 2023-11-01 ENCOUNTER — Encounter: Payer: Self-pay | Admitting: Obstetrics and Gynecology

## 2023-11-01 LAB — CERVICOVAGINAL ANCILLARY ONLY
Bacterial Vaginitis (gardnerella): NEGATIVE
Candida Glabrata: NEGATIVE
Candida Vaginitis: POSITIVE — AB
Chlamydia: NEGATIVE
Comment: NEGATIVE
Comment: NEGATIVE
Comment: NEGATIVE
Comment: NEGATIVE
Comment: NEGATIVE
Comment: NORMAL
Neisseria Gonorrhea: NEGATIVE
Trichomonas: NEGATIVE

## 2023-11-01 MED ORDER — FLUCONAZOLE 150 MG PO TABS: 150.0000 mg | ORAL_TABLET | Freq: Once | ORAL | 0 refills | Status: AC

## 2023-11-01 NOTE — Addendum Note (Signed)
Addended by: Harvie Bridge on: 11/01/2023 02:03 PM   Modules accepted: Orders

## 2023-11-03 ENCOUNTER — Encounter: Payer: Self-pay | Admitting: Obstetrics and Gynecology

## 2023-11-03 LAB — CYTOLOGY - PAP
Comment: NEGATIVE
Diagnosis: NEGATIVE
High risk HPV: NEGATIVE

## 2023-11-04 ENCOUNTER — Telehealth (HOSPITAL_BASED_OUTPATIENT_CLINIC_OR_DEPARTMENT_OTHER): Payer: Self-pay | Admitting: *Deleted

## 2023-11-04 NOTE — Telephone Encounter (Signed)
 Copied from CRM [id number]. Topic: Clinical - Medical Advice >> Nov 04, 2023  8:17 AM Myrick T wrote: Reason for CRM: Patients spouse called stated patient is experiencing pain in her left ear and the sound of the ocean. No appt available, please f//u with patient at [phone]

## 2023-11-04 NOTE — Telephone Encounter (Signed)
 Called pt's spouse back letting him know the info per [employer]. He said he would discuss this with pt to see if she wants to wait until next week to be seen for an appt or if she cannot wait. Told him if pt cannot wait until next week that he will need to take pt to UC and he verbalized understanding. They will call back if they want to schedule an appt with office next week.

## 2023-11-04 NOTE — Telephone Encounter (Signed)
 Called and spoke with pt's spouse who states that patient is having pain in her ears and states that they are also feeling full almost like she can hear a sound similar to the ocean in her ears.  Denies any current drainage from the ears. Spouse states that this began about a week ago but states that it started getting worse about 2 days ago.  Spouse was wanting pt to be seen but no appointments available. Pt and spouse are wanting recommendations.   Alexis, please advise.

## 2023-11-09 ENCOUNTER — Encounter (HOSPITAL_BASED_OUTPATIENT_CLINIC_OR_DEPARTMENT_OTHER): Payer: BLUE CROSS/BLUE SHIELD | Admitting: Family Medicine

## 2023-11-11 NOTE — Progress Notes (Signed)
Please let pt know we recommend follow up in 5 months for her elevated cholesterol. Her glucose was slightly elevated and is borderline prediabetic  and would recommend recheck at that time as well. Recommend diet and exercise changes.  Avoid greasy/fatty foods. Adhere to a diet with lean proteins, vegetables, fruits, and low carbohydrates. Drink plenty of water. Regular exercise.

## 2023-12-10 ENCOUNTER — Inpatient Hospital Stay (HOSPITAL_COMMUNITY)
Admission: AD | Admit: 2023-12-10 | Discharge: 2023-12-10 | Disposition: A | Discharge status: Home / Self Care | Attending: Obstetrics and Gynecology | Admitting: Obstetrics and Gynecology

## 2023-12-10 ENCOUNTER — Telehealth (HOSPITAL_BASED_OUTPATIENT_CLINIC_OR_DEPARTMENT_OTHER): Payer: Self-pay | Admitting: Family Medicine

## 2023-12-10 DIAGNOSIS — N939 Abnormal uterine and vaginal bleeding, unspecified: Secondary | ICD-10-CM | POA: Insufficient documentation

## 2023-12-10 DIAGNOSIS — B3731 Acute candidiasis of vulva and vagina: Secondary | ICD-10-CM | POA: Diagnosis not present

## 2023-12-10 DIAGNOSIS — N920 Excessive and frequent menstruation with regular cycle: Secondary | ICD-10-CM | POA: Diagnosis present

## 2023-12-10 LAB — CBC
HCT: 38.9 % (ref 36.0–46.0)
Hemoglobin: 12.7 g/dL (ref 12.0–15.0)
MCH: 26.5 pg (ref 26.0–34.0)
MCHC: 32.6 g/dL (ref 30.0–36.0)
MCV: 81.2 fL (ref 80.0–100.0)
Platelets: 195 10*3/uL (ref 150–400)
RBC: 4.79 MIL/uL (ref 3.87–5.11)
RDW: 14.1 % (ref 11.5–15.5)
WBC: 7.3 10*3/uL (ref 4.0–10.5)
nRBC: 0 % (ref 0.0–0.2)

## 2023-12-10 LAB — HCG, QUANTITATIVE, PREGNANCY: hCG, Beta Chain, Quant, S: <1 m[IU]/mL (ref <5)

## 2023-12-10 MED ORDER — FLUCONAZOLE 150 MG PO TABS
150.0000 mg | ORAL_TABLET | Freq: Once | ORAL | Status: AC
  Administered 2023-12-10: 150 mg via ORAL
  Filled 2023-12-10: qty 1

## 2023-12-10 MED ORDER — MEGESTROL ACETATE 40 MG PO TABS
40.0000 mg | ORAL_TABLET | Freq: Once | ORAL | Status: AC
  Administered 2023-12-10: 40 mg via ORAL
  Filled 2023-12-10: qty 1

## 2023-12-10 MED ORDER — MEGESTROL ACETATE 40 MG PO TABS: 40.0000 mg | ORAL_TABLET | Freq: Every day | ORAL | 0 refills | Status: AC

## 2023-12-10 NOTE — MAU Note (Signed)
.  Brittany Fritz is a 38 y.o. at Unknown here in MAU reporting her period started Sunday night. Monday and Tues her bleeding was heavy with clots. She has to wear diapers and whenever she moves a lot of blood and sometimes clots, will come out. She has low iron and has been taking OTC iron. Feels very tired and thinks the bleeding may have made her iron lower. No pain. Pt states she has never bled heavily like this period  LMP: 12/05/23 Onset of complaint: Sunday Pain score: 0 Vitals:   12/10/23 0442 12/10/23 0445  BP:  116/65  Pulse: 96   Resp: 18   Temp: 97.6 F (36.4 C)   SpO2: 100%      FHT: n/a  Lab orders placed from triage: none

## 2023-12-10 NOTE — Progress Notes (Signed)
 Written and verbal d/c instructions given and understanding voiced.

## 2023-12-10 NOTE — MAU Provider Note (Signed)
 Chief Complaint:  Vaginal Bleeding   None      HPI: Brittany Fritz is a 38 y.o. [phone] who reports being not pregnant,  presents to maternity admissions reporting heavy menstrual bleeding.  Had her GYN annual exam in January with Dr Berton Lan and mentioned having new onset of heavy menses.  Also c/o yeast infection with itching at beginning of periods. She reports vaginal bleeding, vaginal itching/burning, denies urinary symptoms, h/a, dizziness, n/v, or fever/chills.    Not using any contraception  Vaginal Bleeding The patient's primary symptoms include genital itching and vaginal bleeding. The patient's pertinent negatives include no genital odor. This is a recurrent problem. The current episode started in the past 7 days. The problem has been unchanged. The patient is experiencing no pain. She is not pregnant. Pertinent negatives include no chills, diarrhea, dysuria, fever or frequency. The vaginal discharge was bloody. The vaginal bleeding is heavier than menses. She has been passing clots. She has not been passing tissue. She has tried nothing for the symptoms.   RN Note: Brittany Fritz is a 38 y.o. at Unknown here in MAU reporting her period started Sunday night. Monday and Tues her bleeding was heavy with clots. She has to wear diapers and whenever she moves a lot of blood and sometimes clots, will come out. She has low iron and has been taking OTC iron. Feels very tired and thinks the bleeding may have made her iron lower. No pain. Pt states she has never bled heavily like this period   LMP: 12/05/23  Past Medical History: Past Medical History:  Diagnosis Date   Allergy    Seasonal   BMI 40.0-44.9, adult (HCC)    Chest pain    Gestational diabetes    History of gestational diabetes    Insulin resistance    Medical history non-contributory    Morbid obesity due to excess calories (HCC)     Past obstetric history: OB History  Gravida Para Term Preterm AB Living  4 3 3  1 3   SAB  IAB Ectopic Multiple Live Births  1   0 3    # Outcome Date GA Lbr Len/2nd Weight Sex Type Anes PTL Lv  4 Term 07/27/19 [redacted]w[redacted]d 00:57 / 00:04 3670 g F Vag-Spont Local  LIV  3 Term 03/01/17 [redacted]w[redacted]d 08:47 / 00:04 4160 g M Vag-Spont Local  LIV  2 Term 07/25/13   3600 g M Vag-Spont   LIV  1 SAB             Past Surgical History: Past Surgical History:  Procedure Laterality Date   NO PAST SURGERIES      Family History: Family History  Problem Relation Age of Onset   Heart disease Father    Stroke Father     Social History: Social History   Tobacco Use   Smoking status: Never   Smokeless tobacco: Never  Vaping Use   Vaping status: Never Used  Substance Use Topics   Alcohol use: No   Drug use: No    Allergies:  Allergies  Allergen Reactions   Lomaira [Phentermine Hcl]     ELEVATED BP   Semaglutide Swelling    Gum and cheek swelling   Penicillins Itching and Rash    Has patient had a PCN reaction causing immediate rash, facial/tongue/throat swelling, SOB or lightheadedness with hypotension: Yes Has patient had a PCN reaction causing severe rash involving mucus membranes or skin necrosis: No Has patient had a PCN reaction that  required hospitalization No Has patient had a PCN reaction occurring within the last 10 years: Yes If all of the above answers are "NO", then may proceed with Cephalosporin use.     Meds:  Medications Prior to Admission  Medication Sig Dispense Refill Last Dose/Taking   ergocalciferol (VITAMIN D2) 1.25 MG (50000 UT) capsule Take 50,000 Units by mouth once a week.      ibuprofen (ADVIL) 600 MG tablet Take 1 tablet (600 mg total) by mouth every 6 (six) hours. 30 tablet 0    Iron, Ferrous Sulfate, 325 (65 Fe) MG TABS Take 1 tablet by mouth every other day. 30 tablet 0    metFORMIN (GLUCOPHAGE-XR) 500 MG 24 hr tablet Take 500 mg by mouth daily. (Patient not taking: Reported on 10/29/2023)       I have reviewed patient's Past Medical Hx, Surgical Hx,  Family Hx, Social Hx, medications and allergies.  ROS:  Review of Systems  Constitutional:  Negative for chills and fever.  Gastrointestinal:  Negative for diarrhea.  Genitourinary:  Positive for vaginal bleeding. Negative for dysuria and frequency.   Other systems negative     Physical Exam  Patient Vitals for the past 24 hrs:  BP Temp Pulse Resp SpO2 Height Weight  12/10/23 0445 116/65 -- -- -- -- -- --  12/10/23 0442 -- 97.6 F (36.4 C) 96 18 100 % 5\' 2"  (1.575 m) 111.1 kg   Constitutional: Well-developed, well-nourished female in no acute distress.  Cardiovascular: normal rate and rhythm, no ectopy audible, S1 & S2 heard, no murmur Respiratory: normal effort, no distress.  GI: Abd soft, non-tender.  Nondistended.  No rebound, No guarding.  Bowel Sounds audible  MS: Extremities nontender, no edema, normal ROM Neurologic: Alert and oriented x 4.   Grossly nonfocal. GU: Neg CVAT. Skin:  Warm and Dry Psych:  Affect appropriate.  PELVIC EXAM: deferred   Labs: Results for orders placed or performed during the hospital encounter of 12/10/23 (from the past 24 hours)  CBC     Status: None   Collection Time: 12/10/23  4:59 AM  Result Value Ref Range   WBC 7.3 4.0 - 10.5 K/uL   RBC 4.79 3.87 - 5.11 MIL/uL   Hemoglobin 12.7 12.0 - 15.0 g/dL   HCT 09.8 11.9 - 14.7 %   MCV 81.2 80.0 - 100.0 fL   MCH 26.5 26.0 - 34.0 pg   MCHC 32.6 30.0 - 36.0 g/dL   RDW 82.9 56.2 - 13.0 %   Platelets 195 150 - 400 K/uL   nRBC 0.0 0.0 - 0.2 %  hCG, quantitative, pregnancy     Status: None   Collection Time: 12/10/23  4:59 AM  Result Value Ref Range   hCG, Beta Chain, Quant, S <1 <5 mIU/mL     Last Hemoglobin was 14 in December  Imaging:  No results found.  MAU Course/MDM: I have reviewed the triage vital signs and the nursing notes.   Pertinent labs & imaging results that were available during my care of the patient were reviewed by me and considered in my medical decision making (see  chart for details).      I have reviewed her medical records including past results, notes and treatments.   I have ordered labs as follows: CBC and HCG.  Hemoglobin has dropped somewhat but is still good Imaging ordered: none Results reviewed.   Treatments in MAU included Diflucan and Megace 40mg .   Pt stable at time of discharge.  Assessment: 1. Abnormal uterine bleeding (AUB)   2. Vaginal yeast infection     Plan: Discharge home Recommend follow up with Dr Berton Lan Rx sent for Megace for AUB (instructed to take one per day for 7 days each menses  Encouraged to return here or to other Urgent Care/ED if she develops worsening of symptoms, increase in pain, fever, or other concerning symptoms.   Wynelle Bourgeois CNM, MSN Certified Nurse-Midwife 12/10/2023 5:28 AM

## 2023-12-10 NOTE — Telephone Encounter (Signed)
 Copied from CRM [id number]. Topic: Appointments - Appointment Scheduling >> Dec 10, 2023 12:56 PM Brittany Fritz wrote: Menstruation is heavy and she is feeling tired  maybe she needs iron infusion spouse said  pls call him [phone] No sooner appt before april.. was in er.. pls contact for earlier appt

## 2023-12-13 ENCOUNTER — Ambulatory Visit: Admitting: Obstetrics and Gynecology

## 2023-12-13 ENCOUNTER — Other Ambulatory Visit (HOSPITAL_COMMUNITY)
Admission: RE | Admit: 2023-12-13 | Discharge: 2023-12-13 | Disposition: A | Discharge status: Home / Self Care | Source: Ambulatory Visit | Attending: Obstetrics and Gynecology | Admitting: Obstetrics and Gynecology

## 2023-12-13 VITALS — BP 116/80 | HR 96 | Wt 244.0 lb

## 2023-12-13 DIAGNOSIS — N939 Abnormal uterine and vaginal bleeding, unspecified: Secondary | ICD-10-CM | POA: Diagnosis present

## 2023-12-13 MED ORDER — TRANEXAMIC ACID 650 MG PO TABS: 1300.0000 mg | ORAL_TABLET | Freq: Three times a day (TID) | ORAL | 2 refills | Status: DC

## 2023-12-13 NOTE — Progress Notes (Signed)
 Pt is in office for heavy bleeding with clots. Pt states no bleeding today, taking medication.  Pt still having vaginal itching- had Diflucan in MAU 3/14.  UPT in office today is Negative.

## 2023-12-13 NOTE — Telephone Encounter (Signed)
 Alexis, please advise if you want [place] to get pt scheduled for an appt or if you want her to come for labs to see what those look like?

## 2023-12-13 NOTE — Progress Notes (Signed)
   RETURN GYNECOLOGY VISIT  Subjective:  Brittany Fritz is a 38 y.o. [id number] with LMP 12/03/23 presenting for heavy periods  Seen in MAU 3/14 with HVB. 1 week of bleeding heavier than her usual period and passing clots (golf ball to clementine sized). Was wearing a diaper to deal with the bleeding. In MAU pt was HDS, hcg negative, Hgb 12.7. Was given megace 40mg  x 1 and diflucan for vaginal itching.  Today, bleeding has resolved. She did not take megace beyond what was given in MAU. Notes persistent vaginal itching. Notes her husband has frequent prostatitis and would like testing for possible infections.   I personally reviewed: - MAU note by Wynelle Bourgeois CNM 12/10/23 - CBC 12/10/23 - hCG 12/10/23  Objective:   Vitals:   12/13/23 0858  BP: 116/80  Pulse: 96  Weight: 244 lb (110.7 kg)   General:  Alert, oriented and cooperative. Patient is in no acute distress.  Skin: Skin is warm and dry. No rash noted.   Cardiovascular: Normal heart rate noted  Respiratory: Normal respiratory effort, no problems with respiration noted  Abdomen: Soft, non-tender, non-distended   Pelvic: NEFG. Swabs collected  Exam performed in the presence of a chaperone  Assessment and Plan:  Brittany Fritz is a 38 y.o. with AUB  We reviewed a focused differential diagnosis for AUB that includes, but is not limited to endometrial/endocervical polyps, adenomyosis, fibroids, endometrial hyperplasia/endometrial cancer, infection (e.g., endometritis), and thyroid disease.  UPT GC/CT/trich collected TSH Pelvic ultrasound Given her age < 83 with BMI >/= 30, EMB was recommended. She declines at this time We reviewed management options including Lysteda (TXA) and scheduled NSAIDs. Dicussed role for hormonal management which she declines for now. She opts for lysteda. -     Cervicovaginal ancillary only( [facility]) -     TSH Rfx on Abnormal to Free T4 -     US PELVIC COMPLETE WITH TRANSVAGINAL; Future -     tranexamic  acid (LYSTEDA) 650 MG TABS tablet; Take 2 tablets (1,300 mg total) by mouth 3 (three) times daily. Take during menses for a maximum of five days  Repeat testing for VVC collected  Return for ultrasound follow up.  Future Appointments  Date Time Provider Department Center  01/21/2024  9:10 AM Caudle, Shelton Silvas, FNP [facility]    Lennart Pall, MD

## 2023-12-14 LAB — CERVICOVAGINAL ANCILLARY ONLY
Bacterial Vaginitis (gardnerella): NEGATIVE
Candida Glabrata: NEGATIVE
Candida Vaginitis: POSITIVE — AB
Chlamydia: NEGATIVE
Comment: NEGATIVE
Comment: NEGATIVE
Comment: NEGATIVE
Comment: NEGATIVE
Comment: NEGATIVE
Comment: NORMAL
Neisseria Gonorrhea: NEGATIVE
Trichomonas: NEGATIVE

## 2023-12-14 LAB — TSH RFX ON ABNORMAL TO FREE T4: TSH: 2.4 u[IU]/mL (ref 0.450–4.500)

## 2023-12-15 ENCOUNTER — Encounter: Payer: Self-pay | Admitting: Obstetrics and Gynecology

## 2023-12-15 ENCOUNTER — Ambulatory Visit (HOSPITAL_COMMUNITY)
Admission: RE | Admit: 2023-12-15 | Discharge: 2023-12-15 | Disposition: A | Discharge status: Home / Self Care | Source: Ambulatory Visit | Attending: Obstetrics and Gynecology | Admitting: Obstetrics and Gynecology

## 2023-12-15 DIAGNOSIS — N939 Abnormal uterine and vaginal bleeding, unspecified: Secondary | ICD-10-CM | POA: Insufficient documentation

## 2023-12-15 MED ORDER — FLUCONAZOLE 150 MG PO TABS: 150.0000 mg | ORAL_TABLET | Freq: Once | ORAL | 0 refills | Status: AC

## 2023-12-15 NOTE — Addendum Note (Signed)
 Addended by: Harvie Bridge on: 12/15/2023 01:40 PM   Modules accepted: Orders

## 2023-12-15 NOTE — Telephone Encounter (Signed)
 Attempted to call pt's spouse Osama but unable to reach. Left message for him to return call and also told him I would send mychart message in case he wanted to respond to the [place] message instead. Mychart message sent to pt/spouse.

## 2024-01-11 ENCOUNTER — Ambulatory Visit: Admitting: Obstetrics and Gynecology

## 2024-01-17 ENCOUNTER — Ambulatory Visit (HOSPITAL_COMMUNITY)
Admission: EM | Admit: 2024-01-17 | Discharge: 2024-01-17 | Disposition: A | Discharge status: Home / Self Care | Attending: Emergency Medicine | Admitting: Emergency Medicine

## 2024-01-17 ENCOUNTER — Encounter (HOSPITAL_COMMUNITY): Payer: Self-pay | Admitting: Emergency Medicine

## 2024-01-17 DIAGNOSIS — K047 Periapical abscess without sinus: Secondary | ICD-10-CM | POA: Diagnosis not present

## 2024-01-17 DIAGNOSIS — K0889 Other specified disorders of teeth and supporting structures: Secondary | ICD-10-CM

## 2024-01-17 MED ORDER — IBUPROFEN 800 MG PO TABS: 800.0000 mg | ORAL_TABLET | Freq: Three times a day (TID) | ORAL | 0 refills | Status: DC

## 2024-01-17 MED ORDER — KETOROLAC TROMETHAMINE 30 MG/ML IJ SOLN
30.0000 mg | Freq: Once | INTRAMUSCULAR | Status: AC
  Administered 2024-01-17: 30 mg via INTRAMUSCULAR

## 2024-01-17 MED ORDER — CLINDAMYCIN HCL 300 MG PO CAPS: 300.0000 mg | ORAL_CAPSULE | Freq: Three times a day (TID) | ORAL | 0 refills | Status: AC

## 2024-01-17 MED ORDER — NYSTATIN 100000 UNIT/ML MT SUSP: 5.0000 mL | Freq: Three times a day (TID) | OROMUCOSAL | 0 refills | Status: DC | PRN

## 2024-01-17 MED ORDER — KETOROLAC TROMETHAMINE 30 MG/ML IJ SOLN
INTRAMUSCULAR | Status: AC
  Filled 2024-01-17: qty 1

## 2024-01-17 NOTE — ED Triage Notes (Signed)
 Pt c/o left dental pain for 2 days. Had Tylenol  500 mg earlier.

## 2024-01-17 NOTE — ED Provider Notes (Signed)
 [facility]    CSN: [id number] Arrival date & time: 01/17/24  1918      History   Chief Complaint Chief Complaint  Patient presents with   Dental Pain    HPI Brittany Fritz is a 38 y.o. female.   Patient presents with left lower dental pain x 2 days.  Patient states that she had a crown placed to her back molar about 6 months ago and has not had any issues since, but reports pain to and around this tooth that began 2 days ago  Patient reports she has been taking Tylenol  with minimal relief.  The history is provided by the patient, the spouse and medical records. The history is limited by a language barrier. No language interpreter was used (Declined interpreter).  Dental Pain   Past Medical History:  Diagnosis Date   Allergy    Seasonal   BMI 40.0-44.9, adult (HCC)    Chest pain    Gestational diabetes    History of gestational diabetes    Insulin resistance    Medical history non-contributory    Morbid obesity due to excess calories [facility])     Patient Active Problem List   Diagnosis Date Noted   Vitamin D  deficiency 09/23/2023   Iron  deficiency anemia 09/23/2023   Vaginal bleeding in pregnancy, third trimester 07/27/2019   NSVD (normal spontaneous vaginal delivery) 07/27/2019   Hemorrhoids during pregnancy 07/16/2019   Gestational diabetes mellitus 05/22/2019   GBS bacteriuria 03/27/2019   Maternal obesity, antepartum 03/20/2019   Supervision of other normal pregnancy, antepartum 03/03/2019   History of gestational diabetes mellitus (GDM) in prior pregnancy, currently pregnant 12/17/2016   Immigrant with language difficulty 01/23/2016    Past Surgical History:  Procedure Laterality Date   NO PAST SURGERIES      OB History     Gravida  4   Para  3   Term  3   Preterm      AB  1   Living  3      SAB  1   IAB      Ectopic      Multiple  0   Live Births  3            Home Medications    Prior to Admission medications    Medication Sig Start Date End Date Taking? Authorizing Provider  clindamycin  (CLEOCIN ) 300 MG capsule Take 1 capsule (300 mg total) by mouth 3 (three) times daily for 7 days. 01/17/24 01/24/24 Yes Rosevelt Constable, Elena Cothern A, NP  ibuprofen  (ADVIL ) 800 MG tablet Take 1 tablet (800 mg total) by mouth 3 (three) times daily. 01/17/24  Yes Levora Reas A, NP  magic mouthwash (nystatin , lidocaine , diphenhydrAMINE , alum & mag hydroxide) suspension Swish and spit 5 mLs 3 (three) times daily as needed for mouth pain. 01/17/24  Yes Rosevelt Constable, Mukhtar Shams A, NP  ergocalciferol  (VITAMIN D2) 1.25 MG (50000 UT) capsule Take 50,000 Units by mouth once a week. 08/12/23   [provider]  Iron , Ferrous Sulfate , 325 (65 Fe) MG TABS Take 1 tablet by mouth every other day. 08/18/23   Small, Brooke L, PA  metFORMIN (GLUCOPHAGE-XR) 500 MG 24 hr tablet Take 500 mg by mouth daily. Patient not taking: Reported on 10/29/2023    [provider]  tranexamic acid  (LYSTEDA ) 650 MG TABS tablet Take 2 tablets (1,300 mg total) by mouth 3 (three) times daily. Take during menses for a maximum of five days 12/13/23   Donetta Furl,  Taffy Fabian, MD    Family History Family History  Problem Relation Age of Onset   Heart disease Father    Stroke Father     Social History Social History   Tobacco Use   Smoking status: Never   Smokeless tobacco: Never  Vaping Use   Vaping status: Never Used  Substance Use Topics   Alcohol use: No   Drug use: No     Allergies   Lomaira [phentermine hcl], Semaglutide , and Penicillins   Review of Systems Review of Systems  Per HPI  Physical Exam Triage Vital Signs ED Triage Vitals [01/17/24 2008]  Encounter Vitals Group     BP 104/71     Systolic BP Percentile      Diastolic BP Percentile      Pulse Rate 95     Resp 18     Temp 98.2 F (36.8 C)     Temp Source Oral     SpO2 97 %     Weight      Height      Head Circumference      Peak Flow      Pain Score 10     Pain Loc       Pain Education      Exclude from Growth Chart    No data found.  Updated Vital Signs BP 104/71 (BP Location: Left Arm)   Pulse 95   Temp 98.2 F (36.8 C) (Oral)   Resp 18   SpO2 97%   Visual Acuity Right Eye Distance:   Left Eye Distance:   Bilateral Distance:    Right Eye Near:   Left Eye Near:    Bilateral Near:     Physical Exam Vitals and nursing note reviewed.  Constitutional:      General: She is awake. She is not in acute distress.    Appearance: Normal appearance. She is well-developed and well-groomed. She is not ill-appearing.  HENT:     Mouth/Throat:     Dentition: Abnormal dentition. Dental tenderness and gingival swelling present.  Neurological:     Mental Status: She is alert.  Psychiatric:        Behavior: Behavior is cooperative.      UC Treatments / Results  Labs (all labs ordered are listed, but only abnormal results are displayed) Labs Reviewed - No data to display  EKG   Radiology No results found.  Procedures Procedures (including critical care time)  Medications Ordered in UC Medications  ketorolac  (TORADOL ) 30 MG/ML injection 30 mg (30 mg Intramuscular Given 01/17/24 2030)    Initial Impression / Assessment and Plan / UC Course  I have reviewed the triage vital signs and the nursing notes.  Pertinent labs & imaging results that were available during my care of the patient were reviewed by me and considered in my medical decision making (see chart for details).     Patient is well-appearing.  Vitals are stable.  Crown noted to left lower third molar.  Dental tenderness to left lower third molar with surrounding gingival swelling and tenderness to left lower jaw  Given IM Toradol  in clinic.  Prescribed clindamycin  for dental infection coverage.  Prescribed and milligram ibuprofen  as needed for pain.  Prescribed Magic mouthwash for additional dental pain relief.  Discussed follow-up and return precautions. Final Clinical  Impressions(s) / UC Diagnoses   Final diagnoses:  Pain, dental  Dental infection     Discharge Instructions      Start  taking clindamycin  3 times daily for 7 days for dental infection. You can take 800 mg ibuprofen  every 3 hours as needed for pain.  You can take 650 mg of Tylenol  as needed for breakthrough pain. Use Magic mouthwash 3 times daily as needed to help with dental pain. Call your dentist to schedule a follow-up appointment sooner than later for further evaluation of dental pain.  Return here as needed.    ED Prescriptions     Medication Sig Dispense Auth. Provider   clindamycin  (CLEOCIN ) 300 MG capsule Take 1 capsule (300 mg total) by mouth 3 (three) times daily for 7 days. 21 capsule Levora Reas A, NP   ibuprofen  (ADVIL ) 800 MG tablet Take 1 tablet (800 mg total) by mouth 3 (three) times daily. 21 tablet Levora Reas A, NP   magic mouthwash (nystatin , lidocaine , diphenhydrAMINE , alum & mag hydroxide) suspension Swish and spit 5 mLs 3 (three) times daily as needed for mouth pain. 180 mL Levora Reas A, NP      PDMP not reviewed this encounter.   Levora Reas A, NP 01/17/24 2059

## 2024-01-17 NOTE — Discharge Instructions (Signed)
 Start taking clindamycin  3 times daily for 7 days for dental infection. You can take 800 mg ibuprofen  every 3 hours as needed for pain.  You can take 650 mg of Tylenol  as needed for breakthrough pain. Use Magic mouthwash 3 times daily as needed to help with dental pain. Call your dentist to schedule a follow-up appointment sooner than later for further evaluation of dental pain.  Return here as needed.

## 2024-01-21 ENCOUNTER — Encounter (HOSPITAL_BASED_OUTPATIENT_CLINIC_OR_DEPARTMENT_OTHER): Payer: Medicaid Other | Admitting: Family Medicine

## 2024-02-15 ENCOUNTER — Other Ambulatory Visit: Payer: Self-pay

## 2024-02-15 ENCOUNTER — Emergency Department (HOSPITAL_BASED_OUTPATIENT_CLINIC_OR_DEPARTMENT_OTHER)
Admission: EM | Admit: 2024-02-15 | Discharge: 2024-02-16 | Disposition: A | Discharge status: Home / Self Care | Attending: Emergency Medicine | Admitting: Emergency Medicine

## 2024-02-15 ENCOUNTER — Encounter (HOSPITAL_BASED_OUTPATIENT_CLINIC_OR_DEPARTMENT_OTHER): Payer: Self-pay | Admitting: *Deleted

## 2024-02-15 DIAGNOSIS — R109 Unspecified abdominal pain: Secondary | ICD-10-CM | POA: Diagnosis present

## 2024-02-15 DIAGNOSIS — R1012 Left upper quadrant pain: Secondary | ICD-10-CM | POA: Insufficient documentation

## 2024-02-15 DIAGNOSIS — R079 Chest pain, unspecified: Secondary | ICD-10-CM | POA: Insufficient documentation

## 2024-02-15 LAB — CBC WITH DIFFERENTIAL/PLATELET
Abs Immature Granulocytes: 0.02 10*3/uL (ref 0.00–0.07)
Basophils Absolute: 0 10*3/uL (ref 0.0–0.1)
Basophils Relative: 0 %
Eosinophils Absolute: 0.3 10*3/uL (ref 0.0–0.5)
Eosinophils Relative: 5 %
HCT: 40.8 % (ref 36.0–46.0)
Hemoglobin: 12.9 g/dL (ref 12.0–15.0)
Immature Granulocytes: 0 %
Lymphocytes Relative: 32 %
Lymphs Abs: 2.2 10*3/uL (ref 0.7–4.0)
MCH: 25.6 pg — ABNORMAL LOW (ref 26.0–34.0)
MCHC: 31.6 g/dL (ref 30.0–36.0)
MCV: 81.1 fL (ref 80.0–100.0)
Monocytes Absolute: 0.5 10*3/uL (ref 0.1–1.0)
Monocytes Relative: 7 %
Neutro Abs: 4 10*3/uL (ref 1.7–7.7)
Neutrophils Relative %: 56 %
Platelets: 213 10*3/uL (ref 150–400)
RBC: 5.03 MIL/uL (ref 3.87–5.11)
RDW: 13.9 % (ref 11.5–15.5)
WBC: 7.1 10*3/uL (ref 4.0–10.5)
nRBC: 0 % (ref 0.0–0.2)

## 2024-02-15 LAB — COMPREHENSIVE METABOLIC PANEL WITH GFR
ALT: 15 U/L (ref 0–44)
AST: 16 U/L (ref 15–41)
Albumin: 4 g/dL (ref 3.5–5.0)
Alkaline Phosphatase: 105 U/L (ref 38–126)
Anion gap: 11 (ref 5–15)
BUN: 12 mg/dL (ref 6–20)
CO2: 26 mmol/L (ref 22–32)
Calcium: 9.4 mg/dL (ref 8.9–10.3)
Chloride: 101 mmol/L (ref 98–111)
Creatinine, Ser: 0.67 mg/dL (ref 0.44–1.00)
GFR, Estimated: >60 mL/min (ref >60)
Glucose, Bld: 108 mg/dL — ABNORMAL HIGH (ref 70–99)
Potassium: 4.1 mmol/L (ref 3.5–5.1)
Sodium: 138 mmol/L (ref 135–145)
Total Bilirubin: 0.3 mg/dL (ref 0.0–1.2)
Total Protein: 7.1 g/dL (ref 6.5–8.1)

## 2024-02-15 LAB — D-DIMER, QUANTITATIVE: D-Dimer, Quant: 1 ug{FEU}/mL — ABNORMAL HIGH (ref 0.00–0.50)

## 2024-02-15 LAB — HCG, SERUM, QUALITATIVE: Preg, Serum: NEGATIVE

## 2024-02-15 LAB — LIPASE, BLOOD: Lipase: 28 U/L (ref 11–51)

## 2024-02-15 LAB — TROPONIN T, HIGH SENSITIVITY: Troponin T High Sensitivity: <15 ng/L (ref <19)

## 2024-02-15 MED ORDER — SODIUM CHLORIDE 0.9 % IV BOLUS
1000.0000 mL | Freq: Once | INTRAVENOUS | Status: AC
  Administered 2024-02-15: 1000 mL via INTRAVENOUS

## 2024-02-15 MED ORDER — ONDANSETRON HCL 4 MG/2ML IJ SOLN
4.0000 mg | Freq: Once | INTRAMUSCULAR | Status: AC
  Administered 2024-02-15: 4 mg via INTRAVENOUS
  Filled 2024-02-15: qty 2

## 2024-02-15 MED ORDER — HYDROMORPHONE HCL 1 MG/ML IJ SOLN
1.0000 mg | Freq: Once | INTRAMUSCULAR | Status: AC
  Administered 2024-02-15: 1 mg via INTRAVENOUS
  Filled 2024-02-15: qty 1

## 2024-02-15 NOTE — ED Provider Notes (Signed)
 [place] [facility]GENCY DEPARTMENT AT [facility] Provider Note   CSN: [id number] Arrival date & time: 02/15/24  2249     History  Chief Complaint  Patient presents with   Abdominal Pain    Brittany Fritz is a 38 y.o. female.  Patient presents for evaluation of abdominal pain.  Pain present through the day, worsened approximately 15 minutes before coming to the ED.  At that point pain became severe, sharp and stabbing.  No cough or shortness of breath.  No vomiting or diarrhea.       Home Medications Prior to Admission medications   Medication Sig Start Date End Date Taking? Authorizing Provider  methocarbamol (ROBAXIN) 500 MG tablet Take 1 tablet (500 mg total) by mouth every 8 (eight) hours as needed for muscle spasms. 02/16/24  Yes Rateel Beldin, Marine Sia, MD  pantoprazole (PROTONIX) 40 MG tablet Take 1 tablet (40 mg total) by mouth daily. 02/16/24  Yes Brad Lieurance, Marine Sia, MD  ergocalciferol  (VITAMIN D2) 1.25 MG (50000 UT) capsule Take 50,000 Units by mouth once a week. 08/12/23   [provider]  ibuprofen  (ADVIL ) 800 MG tablet Take 1 tablet (800 mg total) by mouth 3 (three) times daily. 01/17/24   Levora Reas A, NP  Iron , Ferrous Sulfate , 325 (65 Fe) MG TABS Take 1 tablet by mouth every other day. 08/18/23   Small, Brooke L, PA  magic mouthwash (nystatin , lidocaine , diphenhydrAMINE , alum & mag hydroxide) suspension Swish and spit 5 mLs 3 (three) times daily as needed for mouth pain. 01/17/24   Levora Reas A, NP  metFORMIN (GLUCOPHAGE-XR) 500 MG 24 hr tablet Take 500 mg by mouth daily. Patient not taking: Reported on 10/29/2023    [provider]  tranexamic acid  (LYSTEDA ) 650 MG TABS tablet Take 2 tablets (1,300 mg total) by mouth 3 (three) times daily. Take during menses for a maximum of five days 12/13/23   Izell Marsh, MD      Allergies    Lomaira [phentermine hcl], Semaglutide , and Penicillins    Review of Systems   Review of  Systems  Physical Exam Updated Vital Signs BP 122/77   Pulse (!) 115   Temp 98.4 F (36.9 C) (Oral)   Resp 18   LMP 02/15/2024   SpO2 96%  Physical Exam Vitals and nursing note reviewed.  Constitutional:      General: She is not in acute distress.    Appearance: She is well-developed.  HENT:     Head: Normocephalic and atraumatic.     Mouth/Throat:     Mouth: Mucous membranes are moist.  Eyes:     General: Vision grossly intact. Gaze aligned appropriately.     Extraocular Movements: Extraocular movements intact.     Conjunctiva/sclera: Conjunctivae normal.  Cardiovascular:     Rate and Rhythm: Normal rate and regular rhythm.     Pulses: Normal pulses.     Heart sounds: Normal heart sounds, S1 normal and S2 normal. No murmur heard.    No friction rub. No gallop.  Pulmonary:     Effort: Pulmonary effort is normal. No respiratory distress.     Breath sounds: Normal breath sounds.  Chest:    Abdominal:     General: Bowel sounds are normal.     Palpations: Abdomen is soft.     Tenderness: There is abdominal tenderness in the left upper quadrant. There is no guarding or rebound.     Hernia: No hernia is present.  Musculoskeletal:  General: No swelling.     Cervical back: Full passive range of motion without pain, normal range of motion and neck supple. No spinous process tenderness or muscular tenderness. Normal range of motion.     Right lower leg: No edema.     Left lower leg: No edema.  Skin:    General: Skin is warm and dry.     Capillary Refill: Capillary refill takes less than 2 seconds.     Findings: No ecchymosis, erythema, rash or wound.  Neurological:     General: No focal deficit present.     Mental Status: She is alert and oriented to person, place, and time.     GCS: GCS eye subscore is 4. GCS verbal subscore is 5. GCS motor subscore is 6.     Cranial Nerves: Cranial nerves 2-12 are intact.     Sensory: Sensation is intact.     Motor: Motor function  is intact.     Coordination: Coordination is intact.  Psychiatric:        Attention and Perception: Attention normal.        Mood and Affect: Mood normal.        Speech: Speech normal.        Behavior: Behavior normal.     ED Results / Procedures / Treatments   Labs (all labs ordered are listed, but only abnormal results are displayed) Labs Reviewed  CBC WITH DIFFERENTIAL/PLATELET - Abnormal; Notable for the following components:      Result Value   MCH 25.6 (*)    All other components within normal limits  COMPREHENSIVE METABOLIC PANEL WITH GFR - Abnormal; Notable for the following components:   Glucose, Bld 108 (*)    All other components within normal limits  URINALYSIS, ROUTINE W REFLEX MICROSCOPIC - Abnormal; Notable for the following components:   Color, Urine COLORLESS (*)    Hgb urine dipstick TRACE (*)    Bacteria, UA RARE (*)    All other components within normal limits  D-DIMER, QUANTITATIVE - Abnormal; Notable for the following components:   D-Dimer, Quant 1.00 (*)    All other components within normal limits  LIPASE, BLOOD  HCG, SERUM, QUALITATIVE  TROPONIN T, HIGH SENSITIVITY  TROPONIN T, HIGH SENSITIVITY    EKG None  Radiology CT Angio Chest Pulmonary Embolism (PE) W or WO Contrast Result Date: 02/16/2024 CLINICAL DATA:  Left-sided chest and abdominal pain, initial encounter EXAM: CT ANGIOGRAPHY CHEST CT ABDOMEN AND PELVIS WITH CONTRAST TECHNIQUE: Multidetector CT imaging of the chest was performed using the standard protocol during bolus administration of intravenous contrast. Multiplanar CT image reconstructions and MIPs were obtained to evaluate the vascular anatomy. Multidetector CT imaging of the abdomen and pelvis was performed using the standard protocol during bolus administration of intravenous contrast. RADIATION DOSE REDUCTION: This exam was performed according to the departmental dose-optimization program which includes automated exposure control,  adjustment of the mA and/or kV according to patient size and/or use of iterative reconstruction technique. CONTRAST:  OMNIPAQUE  IOHEXOL  350 MG/ML SOLN COMPARISON:  None Available. FINDINGS: CTA CHEST FINDINGS Cardiovascular: Thoracic aorta is within normal limits. No cardiac enlargement is noted. Pulmonary artery shows no filling defect to suggest pulmonary embolism. Mediastinum/Nodes: Thoracic inlet is within normal limits. No hilar or mediastinal adenopathy is noted. The esophagus as visualized is within normal limits. Lungs/Pleura: Lungs demonstrate mild atelectatic changes in the left base. No focal confluent infiltrate or effusion is seen. No bony abnormality is noted. Musculoskeletal: No  chest wall abnormality. No acute or significant osseous findings. Review of the MIP images confirms the above findings. CT ABDOMEN and PELVIS FINDINGS Hepatobiliary: No focal liver abnormality is seen. No gallstones, gallbladder wall thickening, or biliary dilatation. Pancreas: Unremarkable. No pancreatic ductal dilatation or surrounding inflammatory changes. Spleen: Normal in size without focal abnormality. Adrenals/Urinary Tract: Adrenal glands are within normal limits. Kidneys demonstrate a normal enhancement pattern. Punctate nonobstructing right renal stone is seen. The bladder is decompressed. Stomach/Bowel: Appendix is within normal limits. No obstructive or inflammatory changes of the colon are seen. Stomach and small bowel are within normal limits. Vascular/Lymphatic: No significant vascular findings are present. No enlarged abdominal or pelvic lymph nodes. Reproductive: Uterus and bilateral adnexa are unremarkable. Other: No abdominal wall hernia or abnormality. No abdominopelvic ascites. Musculoskeletal: No acute or significant osseous findings. Review of the MIP images confirms the above findings. IMPRESSION: CTA of the chest: No evidence of pulmonary embolism. Mild left basilar atelectasis. CT of the abdomen  and pelvis: Punctate nonobstructing renal stone on the right. No other focal abnormality is noted. Electronically Signed   By: Violeta Grey M.D.   On: 02/16/2024 00:48   CT ABDOMEN PELVIS W CONTRAST Result Date: 02/16/2024 CLINICAL DATA:  Left-sided chest and abdominal pain, initial encounter EXAM: CT ANGIOGRAPHY CHEST CT ABDOMEN AND PELVIS WITH CONTRAST TECHNIQUE: Multidetector CT imaging of the chest was performed using the standard protocol during bolus administration of intravenous contrast. Multiplanar CT image reconstructions and MIPs were obtained to evaluate the vascular anatomy. Multidetector CT imaging of the abdomen and pelvis was performed using the standard protocol during bolus administration of intravenous contrast. RADIATION DOSE REDUCTION: This exam was performed according to the departmental dose-optimization program which includes automated exposure control, adjustment of the mA and/or kV according to patient size and/or use of iterative reconstruction technique. CONTRAST:  OMNIPAQUE  IOHEXOL  350 MG/ML SOLN COMPARISON:  None Available. FINDINGS: CTA CHEST FINDINGS Cardiovascular: Thoracic aorta is within normal limits. No cardiac enlargement is noted. Pulmonary artery shows no filling defect to suggest pulmonary embolism. Mediastinum/Nodes: Thoracic inlet is within normal limits. No hilar or mediastinal adenopathy is noted. The esophagus as visualized is within normal limits. Lungs/Pleura: Lungs demonstrate mild atelectatic changes in the left base. No focal confluent infiltrate or effusion is seen. No bony abnormality is noted. Musculoskeletal: No chest wall abnormality. No acute or significant osseous findings. Review of the MIP images confirms the above findings. CT ABDOMEN and PELVIS FINDINGS Hepatobiliary: No focal liver abnormality is seen. No gallstones, gallbladder wall thickening, or biliary dilatation. Pancreas: Unremarkable. No pancreatic ductal dilatation or surrounding  inflammatory changes. Spleen: Normal in size without focal abnormality. Adrenals/Urinary Tract: Adrenal glands are within normal limits. Kidneys demonstrate a normal enhancement pattern. Punctate nonobstructing right renal stone is seen. The bladder is decompressed. Stomach/Bowel: Appendix is within normal limits. No obstructive or inflammatory changes of the colon are seen. Stomach and small bowel are within normal limits. Vascular/Lymphatic: No significant vascular findings are present. No enlarged abdominal or pelvic lymph nodes. Reproductive: Uterus and bilateral adnexa are unremarkable. Other: No abdominal wall hernia or abnormality. No abdominopelvic ascites. Musculoskeletal: No acute or significant osseous findings. Review of the MIP images confirms the above findings. IMPRESSION: CTA of the chest: No evidence of pulmonary embolism. Mild left basilar atelectasis. CT of the abdomen and pelvis: Punctate nonobstructing renal stone on the right. No other focal abnormality is noted. Electronically Signed   By: Violeta Grey M.D.   On: 02/16/2024 00:48  Procedures Procedures    Medications Ordered in ED Medications  sodium chloride  0.9 % bolus 1,000 mL (0 mLs Intravenous Stopped 02/16/24 0022)  ondansetron  (ZOFRAN ) injection 4 mg (4 mg Intravenous Given 02/15/24 2321)  HYDROmorphone (DILAUDID) injection 1 mg (1 mg Intravenous Given 02/15/24 2321)  iohexol  (OMNIPAQUE ) 350 MG/ML injection 100 mL (100 mLs Intravenous Contrast Given 02/16/24 0026)    ED Course/ Medical Decision Making/ A&P                                 Medical Decision Making Amount and/or Complexity of Data Reviewed Labs: ordered. Radiology: ordered.  Risk Prescription drug management.   Differential Diagnosis considered includes, but not limited to: STEMI; NSTEMI; myocarditis; pericarditis; pulmonary embolism; aortic dissection; pneumothorax; pneumonia; gastritis; musculoskeletal pain; cholelithiasis; cholecystitis;  cholangitis; bowel obstruction; esophagitis; peptic ulcer disease; pancreatitis   Patient presents to the emergency department for evaluation of left sided pain.  Pain seems to be right at the transition point of the chest and abdomen.  She does have some tenderness over the lower costal margin but also mild left upper quadrant tenderness.  Patient tachycardic at arrival.  This is likely secondary to pain and discomfort.  PE considered.  D-dimer was elevated.  She underwent CT angiography that did not show any pathology.  Lab work was entirely unremarkable.  Patient did have a CAT scan of abdomen and pelvis to further evaluate the left upper quadrant region.  No acute abnormality is noted on the scans.  Symptoms likely musculoskeletal in nature but will also start Protonix.  Workup very reassuring, patient can follow-up with primary care.        Final Clinical Impression(s) / ED Diagnoses Final diagnoses:  Left upper quadrant abdominal pain    Rx / DC Orders ED Discharge Orders          Ordered    pantoprazole (PROTONIX) 40 MG tablet  Daily        02/16/24 0114    methocarbamol (ROBAXIN) 500 MG tablet  Every 8 hours PRN        02/16/24 0114              Ballard Bongo, MD 02/16/24 [phone]

## 2024-02-15 NOTE — ED Triage Notes (Signed)
 Pt c/o LUQ and flank pain, onset 15 mins prior to arrival

## 2024-02-16 ENCOUNTER — Emergency Department (HOSPITAL_BASED_OUTPATIENT_CLINIC_OR_DEPARTMENT_OTHER)

## 2024-02-16 LAB — URINALYSIS, ROUTINE W REFLEX MICROSCOPIC
Bilirubin Urine: NEGATIVE
Glucose, UA: NEGATIVE mg/dL
Ketones, ur: NEGATIVE mg/dL
Leukocytes,Ua: NEGATIVE
Nitrite: NEGATIVE
Protein, ur: NEGATIVE mg/dL
Specific Gravity, Urine: 1.013 (ref 1.005–1.030)
pH: 6 (ref 5.0–8.0)

## 2024-02-16 MED ORDER — IOHEXOL 350 MG/ML SOLN
100.0000 mL | Freq: Once | INTRAVENOUS | Status: AC | PRN
  Administered 2024-02-16: 100 mL via INTRAVENOUS

## 2024-02-16 MED ORDER — PANTOPRAZOLE SODIUM 40 MG PO TBEC: 40.0000 mg | DELAYED_RELEASE_TABLET | Freq: Every day | ORAL | 3 refills | Status: DC

## 2024-02-16 MED ORDER — METHOCARBAMOL 500 MG PO TABS: 500.0000 mg | ORAL_TABLET | Freq: Three times a day (TID) | ORAL | 0 refills | Status: DC | PRN

## 2024-02-16 NOTE — ED Notes (Signed)
 Patient transported to CT

## 2024-02-16 NOTE — ED Notes (Signed)
 Pt back from CT

## 2024-02-16 NOTE — ED Notes (Signed)
 Reviewed D/C information with the patient, pt verbalized understanding. No additional concerns at this time.

## 2024-03-02 ENCOUNTER — Other Ambulatory Visit (HOSPITAL_BASED_OUTPATIENT_CLINIC_OR_DEPARTMENT_OTHER): Payer: Self-pay | Admitting: Family Medicine

## 2024-03-02 ENCOUNTER — Encounter (HOSPITAL_BASED_OUTPATIENT_CLINIC_OR_DEPARTMENT_OTHER): Payer: Self-pay | Admitting: Family Medicine

## 2024-03-02 NOTE — Telephone Encounter (Signed)
 Please see mychart message sent by pt's spouse and advise.

## 2024-04-07 ENCOUNTER — Ambulatory Visit (INDEPENDENT_AMBULATORY_CARE_PROVIDER_SITE_OTHER): Admitting: Family Medicine

## 2024-04-07 ENCOUNTER — Encounter (HOSPITAL_BASED_OUTPATIENT_CLINIC_OR_DEPARTMENT_OTHER): Payer: Self-pay | Admitting: Family Medicine

## 2024-04-07 ENCOUNTER — Other Ambulatory Visit (HOSPITAL_COMMUNITY)
Admission: RE | Admit: 2024-04-07 | Discharge: 2024-04-07 | Disposition: A | Discharge status: Home / Self Care | Source: Ambulatory Visit | Attending: Family Medicine | Admitting: Family Medicine

## 2024-04-07 VITALS — BP 113/85 | HR 95 | Ht 62.0 in | Wt 246.0 lb

## 2024-04-07 DIAGNOSIS — N898 Other specified noninflammatory disorders of vagina: Secondary | ICD-10-CM | POA: Diagnosis present

## 2024-04-07 DIAGNOSIS — Z13220 Encounter for screening for lipoid disorders: Secondary | ICD-10-CM | POA: Diagnosis not present

## 2024-04-07 DIAGNOSIS — E66813 Obesity, class 3: Secondary | ICD-10-CM | POA: Diagnosis not present

## 2024-04-07 DIAGNOSIS — Z6841 Body Mass Index (BMI) 40.0 and over, adult: Secondary | ICD-10-CM | POA: Diagnosis not present

## 2024-04-07 DIAGNOSIS — Z0000 Encounter for general adult medical examination without abnormal findings: Secondary | ICD-10-CM

## 2024-04-07 NOTE — Progress Notes (Deleted)
 Subjective:   Brittany Fritz 1985/10/28  04/07/2024   CC: No chief complaint on file.   HPI: Brittany Fritz is a 38 y.o. female who presents for a routine health maintenance exam.  Labs collected at time of visit.    HEALTH SCREENINGS: - Vision Screening: {Blank single:19197::pap done,not applicable,up to date,done elsewhere} - Dental Visits: {Blank single:19197::pap done,not applicable,up to date,done elsewhere} - Pap smear: {Blank single:19197::pap done,not applicable,up to date,done elsewhere} - Breast Exam: {Blank single:19197::pap done,not applicable,up to date,done elsewhere} - STD Screening: {Blank single:19197::Up to date,Ordered today,Not applicable,Declined,Done elsewhere} - Mammogram (40+): {Blank single:19197::Up to date,Ordered today,Not applicable,Refused,Done elsewhere}  - Colonoscopy (45+): {Blank single:19197::Up to date,Ordered today,Not applicable,Refused,Done elsewhere}  - Bone Density (65+ or under 65 with predisposing conditions): {Blank single:19197::Up to date,Ordered today,Not applicable,Refused,Done elsewhere}  - Lung CA screening with low-dose CT:  {Blank single:19197::Up to date,Ordered today,Not applicable,Declined,Done elsewhere} Adults age 9-80 who are current cigarette smokers or quit within the last 15 years. Must have 20 pack year history.   Depression and Anxiety Screen done today and results listed below:     04/07/2024    8:38 AM 09/23/2023    8:41 AM  Depression screen PHQ 2/9  Decreased Interest 0 0  Down, Depressed, Hopeless 0 0  PHQ - 2 Score 0 0  Altered sleeping 0 0  Tired, decreased energy 0 2  Change in appetite 0 0  Feeling bad or failure about yourself  0 0  Trouble concentrating 0 0  Moving slowly or fidgety/restless 0 0  Suicidal thoughts 0 0  PHQ-9 Score 0 2  Difficult doing work/chores Not difficult at all Not difficult at all      04/07/2024     8:38 AM 09/23/2023    8:42 AM  GAD 7 : Generalized Anxiety Score  Nervous, Anxious, on Edge 0 0  Control/stop worrying 0 0  Worry too much - different things 0 0  Trouble relaxing 0 0  Restless 0 0  Easily annoyed or irritable 0 0  Afraid - awful might happen 0 0  Total GAD 7 Score 0 0  Anxiety Difficulty Not difficult at all Not difficult at all    IMMUNIZATIONS: - Tdap: Tetanus vaccination status reviewed: {tetanus status:315746}. - HPV: {Blank single:19197::Up to date,Administered today,Not applicable,Refused,Given elsewhere} - Influenza: {Blank single:19197::Up to date,Administered today,Postponed to flu season,Refused,Given elsewhere} - Pneumovax: {Blank single:19197::Up to date,Administered today,Not applicable,Refused,Given elsewhere} - Prevnar 20: {Blank single:19197::Up to date,Administered today,Not applicable,Refused,Given elsewhere} - Shingrix (50+): {Blank single:19197::Up to date,Administered today,Not applicable,Refused,Given elsewhere}   Past medical history, surgical history, medications, allergies, family history and social history reviewed with patient today and changes made to appropriate areas of the chart.   Past Medical History:  Diagnosis Date   Allergy    Seasonal   BMI 40.0-44.9, adult (HCC)    Chest pain    Gestational diabetes    History of gestational diabetes    Insulin resistance    Medical history non-contributory    Morbid obesity due to excess calories [facility])     Past Surgical History:  Procedure Laterality Date   NO PAST SURGERIES      Current Outpatient Medications on File Prior to Visit  Medication Sig   Multiple Vitamins-Minerals (WOMENS MULTIVITAMIN PO) Take by mouth.   ergocalciferol  (VITAMIN D2) 1.25 MG (50000 UT) capsule Take 50,000 Units by mouth once a week. (Patient not taking: Reported on 04/07/2024)   ibuprofen  (ADVIL ) 800 MG tablet Take 1 tablet (800 mg total) by  mouth 3  (three) times daily. (Patient not taking: Reported on 04/07/2024)   Iron , Ferrous Sulfate , 325 (65 Fe) MG TABS Take 1 tablet by mouth every other day. (Patient not taking: Reported on 04/07/2024)   magic mouthwash (nystatin , lidocaine , diphenhydrAMINE , alum & mag hydroxide) suspension Swish and spit 5 mLs 3 (three) times daily as needed for mouth pain. (Patient not taking: Reported on 04/07/2024)   metFORMIN (GLUCOPHAGE-XR) 500 MG 24 hr tablet Take 500 mg by mouth daily. (Patient not taking: Reported on 04/07/2024)   methocarbamol  (ROBAXIN ) 500 MG tablet Take 1 tablet (500 mg total) by mouth every 8 (eight) hours as needed for muscle spasms. (Patient not taking: Reported on 04/07/2024)   pantoprazole  (PROTONIX ) 40 MG tablet Take 1 tablet (40 mg total) by mouth daily. (Patient not taking: Reported on 04/07/2024)   tranexamic acid  (LYSTEDA ) 650 MG TABS tablet Take 2 tablets (1,300 mg total) by mouth 3 (three) times daily. Take during menses for a maximum of five days (Patient not taking: Reported on 04/07/2024)   No current facility-administered medications on file prior to visit.    Allergies  Allergen Reactions   Lomaira [Phentermine Hcl]     ELEVATED BP   Semaglutide  Swelling    Gum and cheek swelling   Penicillins Itching and Rash    Has patient had a PCN reaction causing immediate rash, facial/tongue/throat swelling, SOB or lightheadedness with hypotension: Yes Has patient had a PCN reaction causing severe rash involving mucus membranes or skin necrosis: No Has patient had a PCN reaction that required hospitalization No Has patient had a PCN reaction occurring within the last 10 years: Yes If all of the above answers are NO, then may proceed with Cephalosporin use.      Social History   Socioeconomic History   Marital status: Married    Spouse name: Not on file   Number of children: Not on file   Years of education: Not on file   Highest education level: Not on file  Occupational  History   Occupation: unemployed  Tobacco Use   Smoking status: Never   Smokeless tobacco: Never  Vaping Use   Vaping status: Never Used  Substance and Sexual Activity   Alcohol use: No   Drug use: No   Sexual activity: Yes    Birth control/protection: None  Other Topics Concern   Not on file  Social History Narrative   Not on file   Social Drivers of Health   Financial Resource Strain: Low Risk  (09/23/2023)   Overall Financial Resource Strain (CARDIA)    Difficulty of Paying Living Expenses: Not hard at all  Food Insecurity: No Food Insecurity (09/23/2023)   Hunger Vital Sign    Worried About Running Out of Food in the Last Year: Never true    Ran Out of Food in the Last Year: Never true  Transportation Needs: No Transportation Needs (09/23/2023)   PRAPARE - Administrator, Civil Service (Medical): No    Lack of Transportation (Non-Medical): No  Physical Activity: Inactive (09/23/2023)   Exercise Vital Sign    Days of Exercise per Week: 0 days    Minutes of Exercise per Session: 0 min  Stress: No Stress Concern Present (09/23/2023)   [employer] of Occupational Health - Occupational Stress Questionnaire    Feeling of Stress : Not at all  Social Connections: Moderately Isolated (09/23/2023)   Social Connection and Isolation Panel    Frequency of Communication with Friends and  Family: More than three times a week    Frequency of Social Gatherings with Friends and Family: Never    Attends Religious Services: Never    Database administrator or Organizations: No    Attends Banker Meetings: Never    Marital Status: Married  Catering manager Violence: Not At Risk (09/23/2023)   Humiliation, Afraid, Rape, and Kick questionnaire    Fear of Current or Ex-Partner: No    Emotionally Abused: No    Physically Abused: No    Sexually Abused: No   Social History   Tobacco Use  Smoking Status Never  Smokeless Tobacco Never   Social History    Substance and Sexual Activity  Alcohol Use No    Family History  Problem Relation Age of Onset   Heart disease Father    Stroke Father      ROS: Denies fever, fatigue, unexplained weight loss/gain, chest pain, SHOB, and palpitations. Denies neurological deficits, gastrointestinal or genitourinary complaints, and skin changes.   Objective:   Today's Vitals   04/07/24 0834  BP: 113/85  Pulse: 95  SpO2: 99%  Weight: 246 lb (111.6 kg)  Height: 5' 2 (1.575 m)    GENERAL APPEARANCE: Well-appearing, in NAD. Well nourished.  SKIN: Pink, warm and dry. Turgor normal. No rash, lesion, ulceration, or ecchymoses. Hair evenly distributed.  HEENT: HEAD: Normocephalic.  EYES: PERRLA. EOMI. Lids intact w/o defect. Sclera white, Conjunctiva pink w/o exudate.  EARS: External ear w/o redness, swelling, masses or lesions. EAC clear. TM's intact, translucent w/o bulging, appropriate landmarks visualized. Appropriate acuity to conversational tones.  NOSE: Septum midline w/o deformity. Nares patent, mucosa pink and non-inflamed w/o drainage. No sinus tenderness.  THROAT: Uvula midline. Oropharynx clear. Tonsils non-inflamed w/o exudate. Oral mucosa pink and moist.  NECK: Supple, Trachea midline. Full ROM w/o pain or tenderness. No lymphadenopathy. Thyroid non-tender w/o enlargement or palpable masses.  BREASTS: Breasts pendulous, symmetrical, and w/o palpable masses. Nipples everted and w/o discharge. No rash or skin retraction. No axillary or supraclavicular lymphadenopathy.  RESPIRATORY: Chest wall symmetrical w/o masses. Respirations even and non-labored. Breath sounds clear to auscultation bilaterally. No wheezes, rales, rhonchi, or crackles. CARDIAC: S1, S2 present, regular rate and rhythm. No gallops, murmurs, rubs, or clicks. PMI w/o lifts, heaves, or thrills. No carotid bruits. Capillary refill <2 seconds. Peripheral pulses 2+ bilaterally. GI: Abdomen soft w/o distention. Normoactive bowel  sounds. No palpable masses or tenderness. No guarding or rebound tenderness. Liver and spleen w/o tenderness or enlargement. No CVA tenderness.  GU: External genitalia without erythema, lesions, or masses. No lymphadenopathy. Vaginal mucosa pink and moist without exudate, lesions, or ulcerations. Cervix pink without discharge. Cervical os closed. Uterus and adnexae palpable, not enlarged, and w/o tenderness. No palpable masses.  MSK: Muscle tone and strength appropriate for age, w/o atrophy or abnormal movement.  EXTREMITIES: Active ROM intact, w/o tenderness, crepitus, or contracture. No obvious joint deformities or effusions. No clubbing, edema, or cyanosis.  NEUROLOGIC: CN's II-XII intact. Motor strength symmetrical with no obvious weakness. No sensory deficits. DTR's 2+ symmetric bilaterally. Steady, even gait.  PSYCH/MENTAL STATUS: Alert, oriented x 3. Cooperative, appropriate mood and affect.   Chaperoned by Damien Many, CMA   Results for orders placed or performed during the hospital encounter of 02/15/24  CBC with Differential/Platelet   Collection Time: 02/15/24 11:17 PM  Result Value Ref Range   WBC 7.1 4.0 - 10.5 K/uL   RBC 5.03 3.87 - 5.11 MIL/uL   Hemoglobin 12.9  12.0 - 15.0 g/dL   HCT 59.1 63.9 - 53.9 %   MCV 81.1 80.0 - 100.0 fL   MCH 25.6 (L) 26.0 - 34.0 pg   MCHC 31.6 30.0 - 36.0 g/dL   RDW 86.0 88.4 - 84.4 %   Platelets 213 150 - 400 K/uL   nRBC 0.0 0.0 - 0.2 %   Neutrophils Relative % 56 %   Neutro Abs 4.0 1.7 - 7.7 K/uL   Lymphocytes Relative 32 %   Lymphs Abs 2.2 0.7 - 4.0 K/uL   Monocytes Relative 7 %   Monocytes Absolute 0.5 0.1 - 1.0 K/uL   Eosinophils Relative 5 %   Eosinophils Absolute 0.3 0.0 - 0.5 K/uL   Basophils Relative 0 %   Basophils Absolute 0.0 0.0 - 0.1 K/uL   Immature Granulocytes 0 %   Abs Immature Granulocytes 0.02 0.00 - 0.07 K/uL  Comprehensive metabolic panel with GFR   Collection Time: 02/15/24 11:17 PM  Result Value Ref Range   Sodium  138 135 - 145 mmol/L   Potassium 4.1 3.5 - 5.1 mmol/L   Chloride 101 98 - 111 mmol/L   CO2 26 22 - 32 mmol/L   Glucose, Bld 108 (H) 70 - 99 mg/dL   BUN 12 6 - 20 mg/dL   Creatinine, Ser 9.32 0.44 - 1.00 mg/dL   Calcium 9.4 8.9 - 89.6 mg/dL   Total Protein 7.1 6.5 - 8.1 g/dL   Albumin 4.0 3.5 - 5.0 g/dL   AST 16 15 - 41 U/L   ALT 15 0 - 44 U/L   Alkaline Phosphatase 105 38 - 126 U/L   Total Bilirubin 0.3 0.0 - 1.2 mg/dL   GFR, Estimated >39 >39 mL/min   Anion gap 11 5 - 15  Lipase, blood   Collection Time: 02/15/24 11:17 PM  Result Value Ref Range   Lipase 28 11 - 51 U/L  hCG, serum, qualitative   Collection Time: 02/15/24 11:17 PM  Result Value Ref Range   Preg, Serum NEGATIVE NEGATIVE  D-dimer, quantitative   Collection Time: 02/15/24 11:17 PM  Result Value Ref Range   D-Dimer, Quant 1.00 (H) 0.00 - 0.50 ug/mL-FEU  Troponin T, High Sensitivity   Collection Time: 02/15/24 11:17 PM  Result Value Ref Range   Troponin T High Sensitivity <15 <19 ng/L  Urinalysis, Routine w reflex microscopic -Urine, Clean Catch   Collection Time: 02/16/24 12:19 AM  Result Value Ref Range   Color, Urine COLORLESS (A) YELLOW   APPearance CLEAR CLEAR   Specific Gravity, Urine 1.013 1.005 - 1.030   pH 6.0 5.0 - 8.0   Glucose, UA NEGATIVE NEGATIVE mg/dL   Hgb urine dipstick TRACE (A) NEGATIVE   Bilirubin Urine NEGATIVE NEGATIVE   Ketones, ur NEGATIVE NEGATIVE mg/dL   Protein, ur NEGATIVE NEGATIVE mg/dL   Nitrite NEGATIVE NEGATIVE   Leukocytes,Ua NEGATIVE NEGATIVE   RBC / HPF 0-5 0 - 5 RBC/hpf   WBC, UA 0-5 0 - 5 WBC/hpf   Bacteria, UA RARE (A) NONE SEEN   Squamous Epithelial / HPF 0-5 0 - 5 /HPF   Mucus PRESENT     Assessment & Plan:  ***  No orders of the defined types were placed in this encounter.   PATIENT COUNSELING:  - Encouraged a healthy well-balanced diet. Patient may adjust caloric intake to maintain or achieve ideal body weight. May reduce intake of dietary saturated fat  and total fat and have adequate dietary potassium and calcium preferably  from fresh fruits, vegetables, and low-fat dairy products.   - Advised to avoid cigarette smoking. - Discussed with the patient that most people either abstain from alcohol or drink within safe limits (<=14/week and <=4 drinks/occasion for males, <=7/weeks and <= 3 drinks/occasion for females) and that the risk for alcohol disorders and other health effects rises proportionally with the number of drinks per week and how often a drinker exceeds daily limits. - Discussed cessation/primary prevention of drug use and availability of treatment for abuse.  - Discussed sexually transmitted diseases, avoidance of unintended pregnancy and contraceptive alternatives.  - Stressed the importance of regular exercise - Injury prevention: Discussed safety belts, safety helmets, smoke detector, smoking near bedding or upholstery.  - Dental health: Discussed importance of regular tooth brushing, flossing, and dental visits.   NEXT PREVENTATIVE PHYSICAL DUE IN 1 YEAR.  No follow-ups on file.  Patient to reach out to office if new, worrisome, or unresolved symptoms arise or if no improvement in patient's condition. Patient verbalized understanding and is agreeable to treatment plan. All questions answered to patient's satisfaction.    Thersia Schuyler Stark, [place]

## 2024-04-07 NOTE — Progress Notes (Addendum)
 Subjective:   Brittany Fritz 17-May-1986  04/07/2024   CC: No chief complaint on file.   HPI: Brittany Fritz is a 38 y.o. female who presents for a routine health maintenance exam.  Labs collected at time of visit.   WEIGHT MANAGEMENT: Pt states she's been lifting 30lbs weights, working on back and abdominal exercises via [employer], and states she wants to lose weight around her midsection. Pt and husband state they are interested in liposuction to remove the extra weight around her abdomen.    HEALTH SCREENINGS: - Vision Screening: up to date - Dental Visits: up to date - Pap smear: up to date - Breast Exam: up to date - STD Screening: Declined - Mammogram (40+): Not applicable  - Colonoscopy (45+): Not applicable  - Bone Density (65+ or under 65 with predisposing conditions): Not applicable  - Lung CA screening with low-dose CT:  Not applicable Adults age 60-80 who are current cigarette smokers or quit within the last 15 years. Must have 20 pack year history.   Depression and Anxiety Screen done today and results listed below:     04/07/2024    8:38 AM 09/23/2023    8:41 AM  Depression screen PHQ 2/9  Decreased Interest 0 0  Down, Depressed, Hopeless 0 0  PHQ - 2 Score 0 0  Altered sleeping 0 0  Tired, decreased energy 0 2  Change in appetite 0 0  Feeling bad or failure about yourself  0 0  Trouble concentrating 0 0  Moving slowly or fidgety/restless 0 0  Suicidal thoughts 0 0  PHQ-9 Score 0 2  Difficult doing work/chores Not difficult at all Not difficult at all      04/07/2024    8:38 AM 09/23/2023    8:42 AM  GAD 7 : Generalized Anxiety Score  Nervous, Anxious, on Edge 0 0  Control/stop worrying 0 0  Worry too much - different things 0 0  Trouble relaxing 0 0  Restless 0 0  Easily annoyed or irritable 0 0  Afraid - awful might happen 0 0  Total GAD 7 Score 0 0  Anxiety Difficulty Not difficult at all Not difficult at all    IMMUNIZATIONS: - Tdap:  Tetanus vaccination status reviewed: last tetanus booster within 10 years. - HPV: Declined  - Influenza: Postponed to flu season - Pneumovax: Not applicable - Prevnar 20: Not applicable - Shingrix (50+): Not applicable   Past medical history, surgical history, medications, allergies, family history and social history reviewed with patient today and changes made to appropriate areas of the chart.   Past Medical History:  Diagnosis Date   Allergy    Seasonal   BMI 40.0-44.9, adult (HCC)    Chest pain    Gestational diabetes    History of gestational diabetes    Insulin resistance    Medical history non-contributory    Morbid obesity due to excess calories [facility])     Past Surgical History:  Procedure Laterality Date   NO PAST SURGERIES      Current Outpatient Medications on File Prior to Visit  Medication Sig   Multiple Vitamins-Minerals (WOMENS MULTIVITAMIN PO) Take by mouth.   ergocalciferol  (VITAMIN D2) 1.25 MG (50000 UT) capsule Take 50,000 Units by mouth once a week. (Patient not taking: Reported on 04/07/2024)   ibuprofen  (ADVIL ) 800 MG tablet Take 1 tablet (800 mg total) by mouth 3 (three) times daily. (Patient not taking: Reported on 04/07/2024)   Iron , Ferrous Sulfate ,  325 (65 Fe) MG TABS Take 1 tablet by mouth every other day. (Patient not taking: Reported on 04/07/2024)   magic mouthwash (nystatin , lidocaine , diphenhydrAMINE , alum & mag hydroxide) suspension Swish and spit 5 mLs 3 (three) times daily as needed for mouth pain. (Patient not taking: Reported on 04/07/2024)   metFORMIN (GLUCOPHAGE-XR) 500 MG 24 hr tablet Take 500 mg by mouth daily. (Patient not taking: Reported on 04/07/2024)   methocarbamol  (ROBAXIN ) 500 MG tablet Take 1 tablet (500 mg total) by mouth every 8 (eight) hours as needed for muscle spasms. (Patient not taking: Reported on 04/07/2024)   pantoprazole  (PROTONIX ) 40 MG tablet Take 1 tablet (40 mg total) by mouth daily. (Patient not taking: Reported on  04/07/2024)   tranexamic acid  (LYSTEDA ) 650 MG TABS tablet Take 2 tablets (1,300 mg total) by mouth 3 (three) times daily. Take during menses for a maximum of five days (Patient not taking: Reported on 04/07/2024)   No current facility-administered medications on file prior to visit.    Allergies  Allergen Reactions   Lomaira [Phentermine Hcl]     ELEVATED BP   Semaglutide  Swelling    Gum and cheek swelling   Penicillins Itching and Rash    Has patient had a PCN reaction causing immediate rash, facial/tongue/throat swelling, SOB or lightheadedness with hypotension: Yes Has patient had a PCN reaction causing severe rash involving mucus membranes or skin necrosis: No Has patient had a PCN reaction that required hospitalization No Has patient had a PCN reaction occurring within the last 10 years: Yes If all of the above answers are NO, then may proceed with Cephalosporin use.      Social History   Socioeconomic History   Marital status: Married    Spouse name: Not on file   Number of children: Not on file   Years of education: Not on file   Highest education level: Not on file  Occupational History   Occupation: unemployed  Tobacco Use   Smoking status: Never   Smokeless tobacco: Never  Vaping Use   Vaping status: Never Used  Substance and Sexual Activity   Alcohol use: No   Drug use: No   Sexual activity: Yes    Birth control/protection: None  Other Topics Concern   Not on file  Social History Narrative   Not on file   Social Drivers of Health   Financial Resource Strain: Low Risk  (09/23/2023)   Overall Financial Resource Strain (CARDIA)    Difficulty of Paying Living Expenses: Not hard at all  Food Insecurity: No Food Insecurity (09/23/2023)   Hunger Vital Sign    Worried About Running Out of Food in the Last Year: Never true    Ran Out of Food in the Last Year: Never true  Transportation Needs: No Transportation Needs (09/23/2023)   PRAPARE - Therapist, art (Medical): No    Lack of Transportation (Non-Medical): No  Physical Activity: Inactive (09/23/2023)   Exercise Vital Sign    Days of Exercise per Week: 0 days    Minutes of Exercise per Session: 0 min  Stress: No Stress Concern Present (09/23/2023)   [employer] of Occupational Health - Occupational Stress Questionnaire    Feeling of Stress : Not at all  Social Connections: Moderately Isolated (09/23/2023)   Social Connection and Isolation Panel    Frequency of Communication with Friends and Family: More than three times a week    Frequency of Social Gatherings with Friends  and Family: Never    Attends Religious Services: Never    Active Member of Clubs or Organizations: No    Attends Banker Meetings: Never    Marital Status: Married  Catering manager Violence: Not At Risk (09/23/2023)   Humiliation, Afraid, Rape, and Kick questionnaire    Fear of Current or Ex-Partner: No    Emotionally Abused: No    Physically Abused: No    Sexually Abused: No   Social History   Tobacco Use  Smoking Status Never  Smokeless Tobacco Never   Social History   Substance and Sexual Activity  Alcohol Use No    Family History  Problem Relation Age of Onset   Heart disease Father    Stroke Father     ROS: Denies fever, fatigue, unexplained weight loss/gain, chest pain, SHOB, and palpitations. Denies neurological deficits, gastrointestinal or genitourinary complaints, and skin changes.   Objective:   Today's Vitals   04/07/24 0834  BP: 113/85  Pulse: 95  SpO2: 99%  Weight: 246 lb (111.6 kg)  Height: 5' 2 (1.575 m)    GENERAL APPEARANCE: Well-appearing, in NAD. Well nourished.  SKIN: Pink, warm and dry. Turgor normal. No rash, lesion, ulceration, or ecchymoses. Hair evenly distributed.  HEENT: HEAD: Normocephalic.  EYES: PERRLA. EOMI. Lids intact w/o defect. Sclera white, Conjunctiva pink w/o exudate.  EARS: External ear w/o redness,  swelling, masses or lesions. EAC clear. TM's intact, translucent w/o bulging, appropriate landmarks visualized. Appropriate acuity to conversational tones.  NOSE: Septum midline w/o deformity. Nares patent, mucosa pink and non-inflamed w/o drainage. No sinus tenderness.  THROAT: Uvula midline. Oropharynx clear. Tonsils non-inflamed w/o exudate. Oral mucosa pink and moist.  NECK: Supple, Trachea midline. Full ROM w/o pain or tenderness. No lymphadenopathy. Thyroid non-tender w/o enlargement or palpable masses.  RESPIRATORY: Chest wall symmetrical w/o masses. Respirations even and non-labored. Breath sounds clear to auscultation bilaterally. No wheezes, rales, rhonchi, or crackles. CARDIAC: S1, S2 present, regular rate and rhythm. No gallops, murmurs, rubs, or clicks. PMI w/o lifts, heaves, or thrills. No carotid bruits. Capillary refill <2 seconds. Peripheral pulses 2+ bilaterally. GI: Abdomen soft w/o distention. Normoactive bowel sounds. No palpable masses or tenderness. No guarding or rebound tenderness. Liver and spleen w/o tenderness or enlargement. No CVA tenderness.  MSK: Muscle tone and strength appropriate for age, w/o atrophy or abnormal movement.  EXTREMITIES: Active ROM intact, w/o tenderness, crepitus, or contracture. No obvious joint deformities or effusions. No clubbing, edema, or cyanosis.  NEUROLOGIC: CN's II-XII intact. Motor strength symmetrical with no obvious weakness. No sensory deficits. DTR's 2+ symmetric bilaterally. Steady, even gait.  PSYCH/MENTAL STATUS: Alert, oriented x 3. Cooperative, appropriate mood and affect.     Assessment & Plan:  1. Annual physical exam (Primary) Discussed preventative screenings, vaccines, and healthy lifestyle with patient and husband.  - HgB A1c  2. Screening for lipid disorders Discussed heart healthy diet and encouraged pt to continue exercise routine.  - Lipid panel  3. Class 3 severe obesity due to excess calories without serious  comorbidity with body mass index (BMI) of 40.0 to 44.9 in adult Lengthy discussion with pt and husband regarding their concerns around pt's weight around her midsection, and their requests for liposuction. Discussed the benefits of working alongside [facility], however pt's husband stated she has already seen them, and was offered medications that caused side effects she did not like. Referral to plastics provided.  - Ambulatory referral to Plastic Surgery  4. Vaginal  itching Pt self-swabbed in office for yeast and BV. She reported itching after provider had left the room.  - Cervicovaginal ancillary only   Orders Placed This Encounter  Procedures   Lipid panel   HgB A1c   Ambulatory referral to Plastic Surgery    Referral Priority:   Routine    Referral Type:   Surgical    Referral Reason:   Specialty Services Required    Requested Specialty:   Plastic Surgery    Number of Visits Requested:   1    PATIENT COUNSELING:  - Encouraged a healthy well-balanced diet. Patient may adjust caloric intake to maintain or achieve ideal body weight. May reduce intake of dietary saturated fat and total fat and have adequate dietary potassium and calcium preferably from fresh fruits, vegetables, and low-fat dairy products.   - Advised to avoid cigarette smoking. - Discussed with the patient that most people either abstain from alcohol or drink within safe limits (<=14/week and <=4 drinks/occasion for males, <=7/weeks and <= 3 drinks/occasion for females) and that the risk for alcohol disorders and other health effects rises proportionally with the number of drinks per week and how often a drinker exceeds daily limits. - Discussed cessation/primary prevention of drug use and availability of treatment for abuse.  - Discussed sexually transmitted diseases, avoidance of unintended pregnancy and contraceptive alternatives.  - Stressed the importance of regular exercise - Injury prevention:  Discussed safety belts, safety helmets, smoke detector, smoking near bedding or upholstery.  - Dental health: Discussed importance of regular tooth brushing, flossing, and dental visits.   NEXT PREVENTATIVE PHYSICAL DUE IN 1 YEAR.  Return in about 1 year (around 04/07/2025) for ANNUAL PHYSICAL.  Patient to reach out to office if new, worrisome, or unresolved symptoms arise or if no improvement in patient's condition. Patient verbalized understanding and is agreeable to treatment plan. All questions answered to patient's satisfaction.   Thersia Stark, FNP-C

## 2024-04-07 NOTE — Progress Notes (Signed)
 Subjective:   Brittany Fritz Apr 11, 1986  04/07/2024   CC: No chief complaint on file.   HPI: Brittany Fritz is a 38 y.o. female who presents for a routine health maintenance exam.  Labs collected at time of visit.   WEIGHT MANAGEMENT: Pt states she's been lifting 30lbs weights, working on back and abdominal exercises via [employer], and states she wants to lose weight around her midsection. Pt and husband state they are interested in liposuction to remove the extra weight around her abdomen.    HEALTH SCREENINGS: - Vision Screening: up to date - Dental Visits: up to date - Pap smear: up to date - Breast Exam: up to date - STD Screening: Declined - Mammogram (40+): Not applicable  - Colonoscopy (45+): Not applicable  - Bone Density (65+ or under 65 with predisposing conditions): Not applicable  - Lung CA screening with low-dose CT:  Not applicable Adults age 69-80 who are current cigarette smokers or quit within the last 15 years. Must have 20 pack year history.   Depression and Anxiety Screen done today and results listed below:     04/07/2024    8:38 AM 09/23/2023    8:41 AM  Depression screen PHQ 2/9  Decreased Interest 0 0  Down, Depressed, Hopeless 0 0  PHQ - 2 Score 0 0  Altered sleeping 0 0  Tired, decreased energy 0 2  Change in appetite 0 0  Feeling bad or failure about yourself  0 0  Trouble concentrating 0 0  Moving slowly or fidgety/restless 0 0  Suicidal thoughts 0 0  PHQ-9 Score 0 2  Difficult doing work/chores Not difficult at all Not difficult at all      04/07/2024    8:38 AM 09/23/2023    8:42 AM  GAD 7 : Generalized Anxiety Score  Nervous, Anxious, on Edge 0 0  Control/stop worrying 0 0  Worry too much - different things 0 0  Trouble relaxing 0 0  Restless 0 0  Easily annoyed or irritable 0 0  Afraid - awful might happen 0 0  Total GAD 7 Score 0 0  Anxiety Difficulty Not difficult at all Not difficult at all    IMMUNIZATIONS: - Tdap:  Tetanus vaccination status reviewed: last tetanus booster within 10 years. - HPV: Declined  - Influenza: Postponed to flu season - Pneumovax: Not applicable - Prevnar 20: Not applicable - Shingrix (50+): Not applicable   Past medical history, surgical history, medications, allergies, family history and social history reviewed with patient today and changes made to appropriate areas of the chart.   Past Medical History:  Diagnosis Date   Allergy    Seasonal   BMI 40.0-44.9, adult (HCC)    Chest pain    Gestational diabetes    History of gestational diabetes    Insulin resistance    Medical history non-contributory    Morbid obesity due to excess calories [facility])     Past Surgical History:  Procedure Laterality Date   NO PAST SURGERIES      Current Outpatient Medications on File Prior to Visit  Medication Sig   Multiple Vitamins-Minerals (WOMENS MULTIVITAMIN PO) Take by mouth.   ergocalciferol  (VITAMIN D2) 1.25 MG (50000 UT) capsule Take 50,000 Units by mouth once a week. (Patient not taking: Reported on 04/07/2024)   ibuprofen  (ADVIL ) 800 MG tablet Take 1 tablet (800 mg total) by mouth 3 (three) times daily. (Patient not taking: Reported on 04/07/2024)   Iron , Ferrous Sulfate ,  325 (65 Fe) MG TABS Take 1 tablet by mouth every other day. (Patient not taking: Reported on 04/07/2024)   magic mouthwash (nystatin , lidocaine , diphenhydrAMINE , alum & mag hydroxide) suspension Swish and spit 5 mLs 3 (three) times daily as needed for mouth pain. (Patient not taking: Reported on 04/07/2024)   metFORMIN (GLUCOPHAGE-XR) 500 MG 24 hr tablet Take 500 mg by mouth daily. (Patient not taking: Reported on 04/07/2024)   methocarbamol  (ROBAXIN ) 500 MG tablet Take 1 tablet (500 mg total) by mouth every 8 (eight) hours as needed for muscle spasms. (Patient not taking: Reported on 04/07/2024)   pantoprazole  (PROTONIX ) 40 MG tablet Take 1 tablet (40 mg total) by mouth daily. (Patient not taking: Reported on  04/07/2024)   tranexamic acid  (LYSTEDA ) 650 MG TABS tablet Take 2 tablets (1,300 mg total) by mouth 3 (three) times daily. Take during menses for a maximum of five days (Patient not taking: Reported on 04/07/2024)   No current facility-administered medications on file prior to visit.    Allergies  Allergen Reactions   Lomaira [Phentermine Hcl]     ELEVATED BP   Semaglutide  Swelling    Gum and cheek swelling   Penicillins Itching and Rash    Has patient had a PCN reaction causing immediate rash, facial/tongue/throat swelling, SOB or lightheadedness with hypotension: Yes Has patient had a PCN reaction causing severe rash involving mucus membranes or skin necrosis: No Has patient had a PCN reaction that required hospitalization No Has patient had a PCN reaction occurring within the last 10 years: Yes If all of the above answers are NO, then may proceed with Cephalosporin use.      Social History   Socioeconomic History   Marital status: Married    Spouse name: Not on file   Number of children: Not on file   Years of education: Not on file   Highest education level: Not on file  Occupational History   Occupation: unemployed  Tobacco Use   Smoking status: Never   Smokeless tobacco: Never  Vaping Use   Vaping status: Never Used  Substance and Sexual Activity   Alcohol use: No   Drug use: No   Sexual activity: Yes    Birth control/protection: None  Other Topics Concern   Not on file  Social History Narrative   Not on file   Social Drivers of Health   Financial Resource Strain: Low Risk  (09/23/2023)   Overall Financial Resource Strain (CARDIA)    Difficulty of Paying Living Expenses: Not hard at all  Food Insecurity: No Food Insecurity (09/23/2023)   Hunger Vital Sign    Worried About Running Out of Food in the Last Year: Never true    Ran Out of Food in the Last Year: Never true  Transportation Needs: No Transportation Needs (09/23/2023)   PRAPARE - Therapist, art (Medical): No    Lack of Transportation (Non-Medical): No  Physical Activity: Inactive (09/23/2023)   Exercise Vital Sign    Days of Exercise per Week: 0 days    Minutes of Exercise per Session: 0 min  Stress: No Stress Concern Present (09/23/2023)   [employer] of Occupational Health - Occupational Stress Questionnaire    Feeling of Stress : Not at all  Social Connections: Moderately Isolated (09/23/2023)   Social Connection and Isolation Panel    Frequency of Communication with Friends and Family: More than three times a week    Frequency of Social Gatherings with Friends  and Family: Never    Attends Religious Services: Never    Active Member of Clubs or Organizations: No    Attends Banker Meetings: Never    Marital Status: Married  Catering manager Violence: Not At Risk (09/23/2023)   Humiliation, Afraid, Rape, and Kick questionnaire    Fear of Current or Ex-Partner: No    Emotionally Abused: No    Physically Abused: No    Sexually Abused: No   Social History   Tobacco Use  Smoking Status Never  Smokeless Tobacco Never   Social History   Substance and Sexual Activity  Alcohol Use No    Family History  Problem Relation Age of Onset   Heart disease Father    Stroke Father     ROS: Denies fever, fatigue, unexplained weight loss/gain, chest pain, SHOB, and palpitations. Denies neurological deficits, gastrointestinal or genitourinary complaints, and skin changes.   Objective:   Today's Vitals   04/07/24 0834  BP: 113/85  Pulse: 95  SpO2: 99%  Weight: 111.6 kg  Height: 5' 2 (1.575 m)    GENERAL APPEARANCE: Well-appearing, in NAD. Well nourished.  SKIN: Pink, warm and dry. Turgor normal. No rash, lesion, ulceration, or ecchymoses. Hair evenly distributed.  HEENT: HEAD: Normocephalic.  EYES: PERRLA. EOMI. Lids intact w/o defect. Sclera white, Conjunctiva pink w/o exudate.  EARS: External ear w/o redness, swelling,  masses or lesions. EAC clear. TM's intact, translucent w/o bulging, appropriate landmarks visualized. Appropriate acuity to conversational tones.  NOSE: Septum midline w/o deformity. Nares patent, mucosa pink and non-inflamed w/o drainage. No sinus tenderness.  THROAT: Uvula midline. Oropharynx clear. Tonsils non-inflamed w/o exudate. Oral mucosa pink and moist.  NECK: Supple, Trachea midline. Full ROM w/o pain or tenderness. No lymphadenopathy. Thyroid non-tender w/o enlargement or palpable masses.  RESPIRATORY: Chest wall symmetrical w/o masses. Respirations even and non-labored. Breath sounds clear to auscultation bilaterally. No wheezes, rales, rhonchi, or crackles. CARDIAC: S1, S2 present, regular rate and rhythm. No gallops, murmurs, rubs, or clicks. PMI w/o lifts, heaves, or thrills. No carotid bruits. Capillary refill <2 seconds. Peripheral pulses 2+ bilaterally. GI: Abdomen soft w/o distention. Normoactive bowel sounds. No palpable masses or tenderness. No guarding or rebound tenderness. Liver and spleen w/o tenderness or enlargement. No CVA tenderness.  MSK: Muscle tone and strength appropriate for age, w/o atrophy or abnormal movement.  EXTREMITIES: Active ROM intact, w/o tenderness, crepitus, or contracture. No obvious joint deformities or effusions. No clubbing, edema, or cyanosis.  NEUROLOGIC: CN's II-XII intact. Motor strength symmetrical with no obvious weakness. No sensory deficits. DTR's 2+ symmetric bilaterally. Steady, even gait.  PSYCH/MENTAL STATUS: Alert, oriented x 3. Cooperative, appropriate mood and affect.   Results for orders placed or performed during the hospital encounter of 02/15/24  CBC with Differential/Platelet   Collection Time: 02/15/24 11:17 PM  Result Value Ref Range   WBC 7.1 4.0 - 10.5 K/uL   RBC 5.03 3.87 - 5.11 MIL/uL   Hemoglobin 12.9 12.0 - 15.0 g/dL   HCT 59.1 63.9 - 53.9 %   MCV 81.1 80.0 - 100.0 fL   MCH 25.6 (L) 26.0 - 34.0 pg   MCHC 31.6 30.0 -  36.0 g/dL   RDW 86.0 88.4 - 84.4 %   Platelets 213 150 - 400 K/uL   nRBC 0.0 0.0 - 0.2 %   Neutrophils Relative % 56 %   Neutro Abs 4.0 1.7 - 7.7 K/uL   Lymphocytes Relative 32 %   Lymphs Abs 2.2 0.7 -  4.0 K/uL   Monocytes Relative 7 %   Monocytes Absolute 0.5 0.1 - 1.0 K/uL   Eosinophils Relative 5 %   Eosinophils Absolute 0.3 0.0 - 0.5 K/uL   Basophils Relative 0 %   Basophils Absolute 0.0 0.0 - 0.1 K/uL   Immature Granulocytes 0 %   Abs Immature Granulocytes 0.02 0.00 - 0.07 K/uL  Comprehensive metabolic panel with GFR   Collection Time: 02/15/24 11:17 PM  Result Value Ref Range   Sodium 138 135 - 145 mmol/L   Potassium 4.1 3.5 - 5.1 mmol/L   Chloride 101 98 - 111 mmol/L   CO2 26 22 - 32 mmol/L   Glucose, Bld 108 (H) 70 - 99 mg/dL   BUN 12 6 - 20 mg/dL   Creatinine, Ser 9.32 0.44 - 1.00 mg/dL   Calcium 9.4 8.9 - 89.6 mg/dL   Total Protein 7.1 6.5 - 8.1 g/dL   Albumin 4.0 3.5 - 5.0 g/dL   AST 16 15 - 41 U/L   ALT 15 0 - 44 U/L   Alkaline Phosphatase 105 38 - 126 U/L   Total Bilirubin 0.3 0.0 - 1.2 mg/dL   GFR, Estimated >39 >39 mL/min   Anion gap 11 5 - 15  Lipase, blood   Collection Time: 02/15/24 11:17 PM  Result Value Ref Range   Lipase 28 11 - 51 U/L  hCG, serum, qualitative   Collection Time: 02/15/24 11:17 PM  Result Value Ref Range   Preg, Serum NEGATIVE NEGATIVE  D-dimer, quantitative   Collection Time: 02/15/24 11:17 PM  Result Value Ref Range   D-Dimer, Quant 1.00 (H) 0.00 - 0.50 ug/mL-FEU  Troponin T, High Sensitivity   Collection Time: 02/15/24 11:17 PM  Result Value Ref Range   Troponin T High Sensitivity <15 <19 ng/L  Urinalysis, Routine w reflex microscopic -Urine, Clean Catch   Collection Time: 02/16/24 12:19 AM  Result Value Ref Range   Color, Urine COLORLESS (A) YELLOW   APPearance CLEAR CLEAR   Specific Gravity, Urine 1.013 1.005 - 1.030   pH 6.0 5.0 - 8.0   Glucose, UA NEGATIVE NEGATIVE mg/dL   Hgb urine dipstick TRACE (A) NEGATIVE    Bilirubin Urine NEGATIVE NEGATIVE   Ketones, ur NEGATIVE NEGATIVE mg/dL   Protein, ur NEGATIVE NEGATIVE mg/dL   Nitrite NEGATIVE NEGATIVE   Leukocytes,Ua NEGATIVE NEGATIVE   RBC / HPF 0-5 0 - 5 RBC/hpf   WBC, UA 0-5 0 - 5 WBC/hpf   Bacteria, UA RARE (A) NONE SEEN   Squamous Epithelial / HPF 0-5 0 - 5 /HPF   Mucus PRESENT     Assessment & Plan:  1. Annual physical exam (Primary) Discussed preventative screenings, vaccines, and healthy lifestyle with patient and husband.  - HgB A1c  2. Screening for lipid disorders Discussed heart healthy diet and encouraged pt to continue exercise routine.  - Lipid panel  3. Class 3 severe obesity due to excess calories without serious comorbidity with body mass index (BMI) of 40.0 to 44.9 in adult Lengthy discussion with pt and husband regarding their concerns around pt's weight around her midsection, and their requests for liposuction. Discussed the benefits of working alongside [facility], however pt's husband stated she has already seen them, and was offered medications that caused side effects she did not like. Referral to plastics provided.  - Ambulatory referral to Plastic Surgery  4. Vaginal itching Pt self-swabbed in office for yeast and BV.  - Cervicovaginal ancillary only  Orders Placed This Encounter  Procedures   Lipid panel   HgB A1c   Ambulatory referral to Plastic Surgery    Referral Priority:   Routine    Referral Type:   Surgical    Referral Reason:   Specialty Services Required    Requested Specialty:   Plastic Surgery    Number of Visits Requested:   1    PATIENT COUNSELING:  - Encouraged a healthy well-balanced diet. Patient may adjust caloric intake to maintain or achieve ideal body weight. May reduce intake of dietary saturated fat and total fat and have adequate dietary potassium and calcium preferably from fresh fruits, vegetables, and low-fat dairy products.   - Advised to avoid cigarette  smoking. - Discussed with the patient that most people either abstain from alcohol or drink within safe limits (<=14/week and <=4 drinks/occasion for males, <=7/weeks and <= 3 drinks/occasion for females) and that the risk for alcohol disorders and other health effects rises proportionally with the number of drinks per week and how often a drinker exceeds daily limits. - Discussed cessation/primary prevention of drug use and availability of treatment for abuse.  - Discussed sexually transmitted diseases, avoidance of unintended pregnancy and contraceptive alternatives.  - Stressed the importance of regular exercise - Injury prevention: Discussed safety belts, safety helmets, smoke detector, smoking near bedding or upholstery.  - Dental health: Discussed importance of regular tooth brushing, flossing, and dental visits.   NEXT PREVENTATIVE PHYSICAL DUE IN 1 YEAR.  Return in about 1 year (around 04/07/2025) for ANNUAL PHYSICAL.  Patient to reach out to office if new, worrisome, or unresolved symptoms arise or if no improvement in patient's condition. Patient verbalized understanding and is agreeable to treatment plan. All questions answered to patient's satisfaction.   Treatment plan and recommendation(s) reviewed by supervising preceptor, Thersia CLEMENTEEN Stark, FNP-C, prior to clinic discharge.   Rosina Ada, BSN, RN  DNP Student

## 2024-04-08 LAB — LIPID PANEL
Chol/HDL Ratio: 4.2 ratio (ref 0.0–4.4)
Cholesterol, Total: 196 mg/dL (ref 100–199)
HDL: 47 mg/dL (ref >39)
LDL Chol Calc (NIH): 127 mg/dL — ABNORMAL HIGH (ref 0–99)
Triglycerides: 120 mg/dL (ref 0–149)
VLDL Cholesterol Cal: 22 mg/dL (ref 5–40)

## 2024-04-08 LAB — HEMOGLOBIN A1C
Est. average glucose Bld gHb Est-mCnc: 114 mg/dL
Hgb A1c MFr Bld: 5.6 % (ref 4.8–5.6)

## 2024-04-09 ENCOUNTER — Ambulatory Visit (HOSPITAL_BASED_OUTPATIENT_CLINIC_OR_DEPARTMENT_OTHER): Payer: Self-pay | Admitting: Family Medicine

## 2024-04-09 NOTE — Progress Notes (Signed)
 Hi Rudean,  Your cholesterol is improved from 6 months ago. Please keep up a heart healthy diet and regular exercise. Your A1C is stable. Please continue the metformin.

## 2024-04-10 ENCOUNTER — Other Ambulatory Visit (HOSPITAL_BASED_OUTPATIENT_CLINIC_OR_DEPARTMENT_OTHER): Payer: Self-pay | Admitting: Family Medicine

## 2024-04-10 DIAGNOSIS — B3731 Acute candidiasis of vulva and vagina: Secondary | ICD-10-CM

## 2024-04-10 LAB — CERVICOVAGINAL ANCILLARY ONLY
Bacterial Vaginitis (gardnerella): NEGATIVE
Candida Glabrata: NEGATIVE
Candida Vaginitis: POSITIVE — AB
Comment: NEGATIVE
Comment: NEGATIVE
Comment: NEGATIVE

## 2024-04-10 MED ORDER — FLUCONAZOLE 150 MG PO TABS: 150.0000 mg | ORAL_TABLET | Freq: Once | ORAL | 0 refills | Status: AC

## 2024-04-10 NOTE — Progress Notes (Signed)
 Hi Brittany Fritz,  Your vaginal swab was positive for yeast. I have sent in Diflucan  tablets to take to clear this up for you.

## 2024-05-18 ENCOUNTER — Institutional Professional Consult (permissible substitution): Admitting: Plastic Surgery

## 2024-06-13 ENCOUNTER — Ambulatory Visit (INDEPENDENT_AMBULATORY_CARE_PROVIDER_SITE_OTHER): Payer: Self-pay | Admitting: Plastic Surgery

## 2024-06-13 VITALS — BP 123/84 | HR 97 | Ht 63.0 in | Wt 250.6 lb

## 2024-06-13 DIAGNOSIS — M793 Panniculitis, unspecified: Secondary | ICD-10-CM | POA: Insufficient documentation

## 2024-06-13 DIAGNOSIS — Z6841 Body Mass Index (BMI) 40.0 and over, adult: Secondary | ICD-10-CM

## 2024-06-13 NOTE — Progress Notes (Signed)
 Patient ID: Brittany Fritz, female    DOB: [birth year], 38 y.o.   MRN: [id number]   Chief Complaint  Patient presents with   Advice Only    The patient is a 38 year old female here with her husband and her son.  She speaks good [place].  She is here for evaluation of her abdomen.  She is interested in weight reduction.  She is 5 feet 3 inches tall and weighs 250 pounds.  She does have diabetes.  She has tried several different medications for weight reduction and has had side effects.  She does not like the way her belly hangs or the excess fat around her lateral and posterior torso area.    Review of Systems  Constitutional: Negative.   HENT: Negative.    Eyes: Negative.   Respiratory: Negative.    Cardiovascular: Negative.   Gastrointestinal: Negative.   Endocrine: Negative.   Genitourinary: Negative.   Musculoskeletal: Negative.     Past Medical History:  Diagnosis Date   Allergy    Seasonal   BMI 40.0-44.9, adult (HCC)    Chest pain    Gestational diabetes    History of gestational diabetes    Insulin resistance    Medical history non-contributory    Morbid obesity due to excess calories [facility])     Past Surgical History:  Procedure Laterality Date   NO PAST SURGERIES        Current Outpatient Medications:    ergocalciferol  (VITAMIN D2) 1.25 MG (50000 UT) capsule, Take 50,000 Units by mouth once a week. (Patient not taking: Reported on 06/13/2024), Disp: , Rfl:    Iron , Ferrous Sulfate , 325 (65 Fe) MG TABS, Take 1 tablet by mouth every other day. (Patient not taking: Reported on 06/13/2024), Disp: 30 tablet, Rfl: 0   metFORMIN (GLUCOPHAGE-XR) 500 MG 24 hr tablet, Take 500 mg by mouth daily. (Patient not taking: Reported on 06/13/2024), Disp: , Rfl:    Multiple Vitamins-Minerals (WOMENS MULTIVITAMIN PO), Take by mouth. (Patient not taking: Reported on 06/13/2024), Disp: , Rfl:    Objective:   Vitals:   06/13/24 0835  BP: 123/84  Pulse: 97  SpO2: 98%    Physical  Exam Vitals reviewed.  Constitutional:      Appearance: Normal appearance.  HENT:     Head: Atraumatic.  Cardiovascular:     Rate and Rhythm: Normal rate.     Pulses: Normal pulses.  Pulmonary:     Effort: Pulmonary effort is normal.  Abdominal:     General: There is no distension.     Palpations: Abdomen is soft. There is no mass.     Tenderness: There is no abdominal tenderness.  Skin:    General: Skin is warm.     Capillary Refill: Capillary refill takes less than 2 seconds.  Neurological:     Mental Status: She is alert and oriented to person, place, and time.  Psychiatric:        Mood and Affect: Mood normal.        Behavior: Behavior normal.        Thought Content: Thought content normal.        Judgment: Judgment normal.     Assessment & Plan:  BMI 40.0-44.9, adult (HCC) - Plan: Amb Ref to Medical Weight Management  Panniculitis  The patient is willing to see the healthy weight and wellness folks to see if they can figure out what food is causing the increased weight.  I recommend at  least 50 pounds reduction prior to any surgical intervention.  This is in order for her to be safe and happy with her results.  If she has surgery without figuring out what is causing the excess weight she will get the weight back and then will be very unhappy with her result.    Estefana RAMAN Sylena Lotter, DO

## 2024-07-04 ENCOUNTER — Encounter (INDEPENDENT_AMBULATORY_CARE_PROVIDER_SITE_OTHER): Payer: Self-pay

## 2024-07-07 ENCOUNTER — Institutional Professional Consult (permissible substitution): Admitting: Plastic Surgery

## 2024-07-13 ENCOUNTER — Ambulatory Visit

## 2024-07-13 ENCOUNTER — Other Ambulatory Visit (HOSPITAL_COMMUNITY)
Admission: RE | Admit: 2024-07-13 | Discharge: 2024-07-13 | Disposition: A | Discharge status: Home / Self Care | Source: Ambulatory Visit | Attending: Obstetrics and Gynecology | Admitting: Obstetrics and Gynecology

## 2024-07-13 VITALS — BP 124/86 | HR 70

## 2024-07-13 DIAGNOSIS — N898 Other specified noninflammatory disorders of vagina: Secondary | ICD-10-CM | POA: Insufficient documentation

## 2024-07-13 NOTE — Progress Notes (Signed)
 SUBJECTIVE:  38 y.o. female complains of curd-like vaginal discharge for 1 week(s). Pt reports having frequent yeast infections.  Denies abnormal vaginal bleeding or significant pelvic pain or fever. No UTI symptoms. Denies history of known exposure to STD.  No LMP recorded.  OBJECTIVE:  She appears well, afebrile. Urine dipstick: not done.  ASSESSMENT:  Vaginal Discharge  Vaginal Itching   PLAN:  GC, chlamydia, trichomonas, BVAG, CVAG probe sent to lab. Treatment: To be determined once lab results are received ROV prn if symptoms persist or worsen.

## 2024-07-17 LAB — CERVICOVAGINAL ANCILLARY ONLY
Bacterial Vaginitis (gardnerella): NEGATIVE
Candida Glabrata: NEGATIVE
Candida Vaginitis: POSITIVE — AB
Chlamydia: NEGATIVE
Comment: NEGATIVE
Comment: NEGATIVE
Comment: NEGATIVE
Comment: NEGATIVE
Comment: NEGATIVE
Comment: NORMAL
Neisseria Gonorrhea: NEGATIVE
Trichomonas: NEGATIVE

## 2024-07-19 ENCOUNTER — Ambulatory Visit: Payer: Self-pay | Admitting: Obstetrics and Gynecology

## 2024-07-19 MED ORDER — FLUCONAZOLE 150 MG PO TABS: 150.0000 mg | ORAL_TABLET | Freq: Once | ORAL | 0 refills | Status: AC

## 2024-08-04 ENCOUNTER — Ambulatory Visit (HOSPITAL_COMMUNITY)
Admission: EM | Admit: 2024-08-04 | Discharge: 2024-08-04 | Disposition: A | Discharge status: Home / Self Care | Attending: Physician Assistant | Admitting: Physician Assistant

## 2024-08-04 ENCOUNTER — Encounter (HOSPITAL_COMMUNITY): Payer: Self-pay

## 2024-08-04 DIAGNOSIS — M5412 Radiculopathy, cervical region: Secondary | ICD-10-CM | POA: Diagnosis not present

## 2024-08-04 DIAGNOSIS — M542 Cervicalgia: Secondary | ICD-10-CM

## 2024-08-04 MED ORDER — NAPROXEN 500 MG PO TABS: 500.0000 mg | ORAL_TABLET | Freq: Two times a day (BID) | ORAL | 0 refills | Status: DC

## 2024-08-04 MED ORDER — METHOCARBAMOL 500 MG PO TABS: ORAL_TABLET | ORAL | 0 refills | Status: DC

## 2024-08-04 NOTE — ED Triage Notes (Signed)
 Pt states has a bulging disk in neck. C/o pain in rt side of neck radiating down rt arm and up to rt side of head x1wk. States having dizziness and nausea. Took tylenol  and patches with some relief.

## 2024-08-04 NOTE — ED Notes (Signed)
 Pt c/o numbness to rt arm.

## 2024-08-04 NOTE — ED Provider Notes (Signed)
 [facility]    CSN: [id number] Arrival date & time: 08/04/24  1823      History   Chief Complaint Chief Complaint  Patient presents with   Neck Pain         HPI Brittany Fritz is a 38 y.o. female.   Patient complains of pain in her neck.  Patient reports that she has pain that goes up her neck and down into her right shoulder.  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  Patient had an MRI which showed the disc in 2018.  Patient states that her pain comes and goes.  Patient reports she has been having more pain than previously.  The history is provided by the patient. No language interpreter was used.  Neck Pain Pain location:  Generalized neck Pain radiates to:  Does not radiate Pain is:  Same all the time Timing:  Constant Progression:  Worsening   Past Medical History:  Diagnosis Date   Allergy    Seasonal   BMI 40.0-44.9, adult (HCC)    Chest pain    Gestational diabetes    History of gestational diabetes    Insulin resistance    Medical history non-contributory    Morbid obesity due to excess calories [facility])     Patient Active Problem List   Diagnosis Date Noted   Panniculitis 06/13/2024   Vitamin D  deficiency 09/23/2023   Iron  deficiency anemia 09/23/2023   Vaginal bleeding in pregnancy, third trimester 07/27/2019   NSVD (normal spontaneous vaginal delivery) 07/27/2019   Hemorrhoids during pregnancy 07/16/2019   Gestational diabetes mellitus 05/22/2019   GBS bacteriuria 03/27/2019   Maternal obesity, antepartum 03/20/2019   Supervision of other normal pregnancy, antepartum 03/03/2019   History of gestational diabetes mellitus (GDM) in prior pregnancy, currently pregnant 12/17/2016   Immigrant with language difficulty 01/23/2016    Past Surgical History:  Procedure Laterality Date   NO PAST SURGERIES      OB History     Gravida  4   Para  3   Term  3   Preterm      AB  1   Living  3      SAB  1    IAB      Ectopic      Multiple  0   Live Births  3            Home Medications    Prior to Admission medications   Medication Sig Start Date End Date Taking? Authorizing Provider  ergocalciferol  (VITAMIN D2) 1.25 MG (50000 UT) capsule Take 50,000 Units by mouth once a week. Patient not taking: Reported on 06/13/2024 08/12/23   [provider]  Iron , Ferrous Sulfate , 325 (65 Fe) MG TABS Take 1 tablet by mouth every other day. Patient not taking: Reported on 06/13/2024 08/18/23   Small, Brooke L, PA  metFORMIN (GLUCOPHAGE-XR) 500 MG 24 hr tablet Take 500 mg by mouth daily. Patient not taking: Reported on 06/13/2024    [provider]  Multiple Vitamins-Minerals (WOMENS MULTIVITAMIN PO) Take by mouth. Patient not taking: Reported on 06/13/2024    [provider]    Family History Family History  Problem Relation Age of Onset   Heart disease Father    Stroke Father     Social History Social History   Tobacco Use   Smoking status: Never   Smokeless tobacco: Never  Vaping Use   Vaping status: Never Used  Substance Use Topics   Alcohol use: No   Drug use: No     Allergies   Lomaira [phentermine hcl], Semaglutide , and Penicillins   Review of Systems Review of Systems  Musculoskeletal:  Positive for neck pain.  All other systems reviewed and are negative.    Physical Exam Triage Vital Signs ED Triage Vitals [08/04/24 1911]  Encounter Vitals Group     BP 131/81     Girls Systolic BP Percentile      Girls Diastolic BP Percentile      Boys Systolic BP Percentile      Boys Diastolic BP Percentile      Pulse Rate (!) 105     Resp 18     Temp 98.3 F (36.8 C)     Temp Source Oral     SpO2 97 %     Weight      Height      Head Circumference      Peak Flow      Pain Score      Pain Loc      Pain Education      Exclude from Growth Chart    No data found.  Updated Vital Signs BP 131/81 (BP Location: Left Arm)   Pulse (!) 105    Temp 98.3 F (36.8 C) (Oral)   Resp 18   LMP 07/26/2024   SpO2 97%   Breastfeeding No   Visual Acuity Right Eye Distance:   Left Eye Distance:   Bilateral Distance:    Right Eye Near:   Left Eye Near:    Bilateral Near:     Physical Exam Vitals and nursing note reviewed.  Constitutional:      Appearance: She is well-developed.  HENT:     Head: Normocephalic.  Cardiovascular:     Rate and Rhythm: Normal rate.  Pulmonary:     Effort: Pulmonary effort is normal.  Abdominal:     General: There is no distension.  Musculoskeletal:        General: Tenderness present. Normal range of motion.     Cervical back: Normal range of motion.     Comments: Tender c5-c6 area, from nv and ns intact  Skin:    General: Skin is warm.  Neurological:     General: No focal deficit present.     Mental Status: She is alert and oriented to person, place, and time.      UC Treatments / Results  Labs (all labs ordered are listed, but only abnormal results are displayed) Labs Reviewed - No data to display  EKG   Radiology No results found.  Procedures Procedures (including critical care time)  Medications Ordered in UC Medications - No data to display  Initial Impression / Assessment and Plan / UC Course  I have reviewed the triage vital signs and the nursing notes.  Pertinent labs & imaging results that were available during my care of the patient were reviewed by me and considered in my medical decision making (see chart for details).      Final Clinical Impressions(s) / UC Diagnoses   Final diagnoses:  Neck pain  Cervical radiculopathy   Discharge Instructions   None    ED Prescriptions     Medication Sig Dispense Auth. Provider   methocarbamol  (ROBAXIN ) 500 MG tablet Take one tablet at night 20 tablet Azrielle Springsteen K, PA-C   naproxen  (NAPROSYN ) 500 MG tablet Take 1 tablet (500 mg total) by mouth 2 (two)  times daily with a meal. 30 tablet Flint Sonny POUR, PA-C       PDMP not reviewed this encounter. An After Visit Summary was printed and given to the patient.       Flint Sonny POUR, PA-C 08/04/24 2004

## 2024-08-09 ENCOUNTER — Ambulatory Visit (HOSPITAL_COMMUNITY)
Admission: EM | Admit: 2024-08-09 | Discharge: 2024-08-09 | Disposition: A | Discharge status: Home / Self Care | Attending: Emergency Medicine | Admitting: Emergency Medicine

## 2024-08-09 ENCOUNTER — Encounter (HOSPITAL_COMMUNITY): Payer: Self-pay | Admitting: Emergency Medicine

## 2024-08-09 DIAGNOSIS — N3000 Acute cystitis without hematuria: Secondary | ICD-10-CM | POA: Insufficient documentation

## 2024-08-09 LAB — POCT URINE DIPSTICK
Bilirubin, UA: NEGATIVE
Blood, UA: NEGATIVE
Glucose, UA: NEGATIVE mg/dL
Ketones, POC UA: NEGATIVE mg/dL
Nitrite, UA: NEGATIVE
POC PROTEIN,UA: NEGATIVE
Spec Grav, UA: 1.02 (ref 1.010–1.025)
Urobilinogen, UA: 0.2 U/dL
pH, UA: 7.5 (ref 5.0–8.0)

## 2024-08-09 LAB — POCT URINE PREGNANCY: Preg Test, Ur: NEGATIVE

## 2024-08-09 MED ORDER — SULFAMETHOXAZOLE-TRIMETHOPRIM 800-160 MG PO TABS: 1.0000 | ORAL_TABLET | Freq: Two times a day (BID) | ORAL | 0 refills | Status: AC

## 2024-08-09 MED ORDER — FLUCONAZOLE 150 MG PO TABS: 150.0000 mg | ORAL_TABLET | Freq: Every day | ORAL | 0 refills | Status: DC

## 2024-08-09 MED ORDER — PHENAZOPYRIDINE HCL 95 MG PO TABS: 95.0000 mg | ORAL_TABLET | Freq: Three times a day (TID) | ORAL | 0 refills | Status: DC | PRN

## 2024-08-09 NOTE — ED Provider Notes (Signed)
 [facility]    CSN: [id number] Arrival date & time: 08/09/24  1841      History   Chief Complaint Chief Complaint  Patient presents with   Abdominal Pain   Urinary Frequency   Back Pain    HPI Brittany Fritz is a 38 y.o. female.   Patient presents to clinic over concern of suprapubic pain, urgency, frequency and dysuria that started this morning.  Denies hematuria.  Without nausea or vomiting.  Feels like the lower abdominal pain now radiates into her flank.  Has not tried medications or interventions.  Normal bowel movement this morning.  Has had issues with yeast vaginitis in the past, does not currently have any vaginal itching.  Denies vaginal issues or discharge currently.  History of insulin resistance.  The history is provided by the patient and medical records.  Abdominal Pain Urinary Frequency  Back Pain   Past Medical History:  Diagnosis Date   Allergy    Seasonal   BMI 40.0-44.9, adult (HCC)    Chest pain    Gestational diabetes    History of gestational diabetes    Insulin resistance    Medical history non-contributory    Morbid obesity due to excess calories [facility])     Patient Active Problem List   Diagnosis Date Noted   Panniculitis 06/13/2024   Vitamin D  deficiency 09/23/2023   Iron  deficiency anemia 09/23/2023   Vaginal bleeding in pregnancy, third trimester 07/27/2019   NSVD (normal spontaneous vaginal delivery) 07/27/2019   Hemorrhoids during pregnancy 07/16/2019   Gestational diabetes mellitus 05/22/2019   GBS bacteriuria 03/27/2019   Maternal obesity, antepartum 03/20/2019   Supervision of other normal pregnancy, antepartum 03/03/2019   History of gestational diabetes mellitus (GDM) in prior pregnancy, currently pregnant 12/17/2016   Immigrant with language difficulty 01/23/2016    Past Surgical History:  Procedure Laterality Date   NO PAST SURGERIES      OB History     Gravida  4   Para  3   Term  3   Preterm       AB  1   Living  3      SAB  1   IAB      Ectopic      Multiple  0   Live Births  3            Home Medications    Prior to Admission medications   Medication Sig Start Date End Date Taking? Authorizing Provider  fluconazole  (DIFLUCAN ) 150 MG tablet Take 1 tablet (150 mg total) by mouth daily. 08/09/24  Yes Kainen Struckman  N, FNP  phenazopyridine (PYRIDIUM) 95 MG tablet Take 1 tablet (95 mg total) by mouth 3 (three) times daily as needed for pain. 08/09/24  Yes Jaydynn Wolford  N, FNP  sulfamethoxazole-trimethoprim (BACTRIM DS) 800-160 MG tablet Take 1 tablet by mouth 2 (two) times daily for 3 days. 08/09/24 08/12/24 Yes Genene Kilman  N, FNP  methocarbamol  (ROBAXIN ) 500 MG tablet Take one tablet at night 08/04/24   Sofia, Leslie K, PA-C  naproxen  (NAPROSYN ) 500 MG tablet Take 1 tablet (500 mg total) by mouth 2 (two) times daily with a meal. 08/04/24 08/04/25  Flint Sonny POUR, PA-C    Family History Family History  Problem Relation Age of Onset   Heart disease Father    Stroke Father     Social History Social History   Tobacco Use   Smoking status: Never   Smokeless tobacco: Never  Vaping  Use   Vaping status: Never Used  Substance Use Topics   Alcohol use: No   Drug use: No     Allergies   Lomaira [phentermine hcl], Semaglutide , and Penicillins   Review of Systems Review of Systems  Per HPI  Physical Exam Triage Vital Signs ED Triage Vitals  Encounter Vitals Group     BP 08/09/24 1958 115/81     Girls Systolic BP Percentile --      Girls Diastolic BP Percentile --      Boys Systolic BP Percentile --      Boys Diastolic BP Percentile --      Pulse Rate 08/09/24 1958 (!) 105     Resp 08/09/24 1958 18     Temp 08/09/24 1958 98.1 F (36.7 C)     Temp Source 08/09/24 1958 Oral     SpO2 08/09/24 1958 97 %     Weight --      Height --      Head Circumference --      Peak Flow --      Pain Score 08/09/24 1957 8     Pain Loc --       Pain Education --      Exclude from Growth Chart --    No data found.  Updated Vital Signs BP 115/81 (BP Location: Left Arm)   Pulse (!) 105   Temp 98.1 F (36.7 C) (Oral)   Resp 18   LMP 07/26/2024   SpO2 97%   Visual Acuity Right Eye Distance:   Left Eye Distance:   Bilateral Distance:    Right Eye Near:   Left Eye Near:    Bilateral Near:     Physical Exam Vitals and nursing note reviewed.  Constitutional:      Appearance: Normal appearance. She is well-developed.  HENT:     Head: Normocephalic and atraumatic.     Right Ear: External ear normal.     Left Ear: External ear normal.     Nose: Nose normal.     Mouth/Throat:     Mouth: Mucous membranes are moist.  Eyes:     Conjunctiva/sclera: Conjunctivae normal.  Cardiovascular:     Rate and Rhythm: Normal rate.  Pulmonary:     Effort: Pulmonary effort is normal. No respiratory distress.  Abdominal:     General: Abdomen is flat.     Palpations: Abdomen is soft.     Tenderness: There is generalized abdominal tenderness and tenderness in the suprapubic area. There is no right CVA tenderness, left CVA tenderness, guarding or rebound.  Skin:    General: Skin is warm and dry.  Neurological:     General: No focal deficit present.     Mental Status: She is alert and oriented to person, place, and time.  Psychiatric:        Mood and Affect: Mood normal.        Behavior: Behavior normal.      UC Treatments / Results  Labs (all labs ordered are listed, but only abnormal results are displayed) Labs Reviewed  POCT URINE DIPSTICK - Abnormal; Notable for the following components:      Result Value   Leukocytes, UA Trace (*)    All other components within normal limits  URINE CULTURE  POCT URINE PREGNANCY    EKG   Radiology No results found.  Procedures Procedures (including critical care time)  Medications Ordered in UC Medications - No data to display  Initial  Impression / Assessment and Plan / UC  Course  I have reviewed the triage vital signs and the nursing notes.  Pertinent labs & imaging results that were available during my care of the patient were reviewed by me and considered in my medical decision making (see chart for details).  Vitals and triage reviewed, patient is hemodynamically stable.  Urinary frequency, urgency and dysuria.  Small leukocytes on exam, will send for culture.  Generalized abdominal tenderness with active bowel sounds, without rebound or guarding.  Tenderness seems worse over suprapubic area.  Presentation consistent with acute cystitis, will treat with Bactrim twice daily for 3 days.  Diflucan  sent in to prevent yeast vaginitis.  Plan of care, follow-up care return precautions given, no questions at this time.    Final Clinical Impressions(s) / UC Diagnoses   Final diagnoses:  Acute cystitis without hematuria     Discharge Instructions      Take the Bactrim twice daily with food for the next 3 days.  At the end of your antibiotics take the Diflucan  to prevent yeast vaginitis.  Take the Azo as needed for burning with urination, this may change the color of your urine.  Ensure you are drinking at least 64 ounces of water daily.  We are sending your urine off for culture and we will contact you if we need to change her antibiotic therapy.  Return to clinic for any new concerning symptoms or if no improvement.     ED Prescriptions     Medication Sig Dispense Auth. Provider   sulfamethoxazole-trimethoprim (BACTRIM DS) 800-160 MG tablet Take 1 tablet by mouth 2 (two) times daily for 3 days. 6 tablet Dreama, Dalante Minus  N, FNP   fluconazole  (DIFLUCAN ) 150 MG tablet Take 1 tablet (150 mg total) by mouth daily. 1 tablet Dreama, Eward Rutigliano  N, FNP   phenazopyridine (PYRIDIUM) 95 MG tablet Take 1 tablet (95 mg total) by mouth 3 (three) times daily as needed for pain. 10 tablet Dreama, Avari Gelles  N, FNP      PDMP not reviewed this encounter.   Dreama  Ryden Wainer  N, FNP 08/09/24 2019

## 2024-08-09 NOTE — ED Triage Notes (Signed)
 Pt reports that this morning started having lower abd pain that radiates to bilateral lower. Reports having urinary frequency and when goes only urinates a little bit. Denies blood in urine.

## 2024-08-09 NOTE — Discharge Instructions (Signed)
 Take the Bactrim twice daily with food for the next 3 days.  At the end of your antibiotics take the Diflucan  to prevent yeast vaginitis.  Take the Azo as needed for burning with urination, this may change the color of your urine.  Ensure you are drinking at least 64 ounces of water daily.  We are sending your urine off for culture and we will contact you if we need to change her antibiotic therapy.  Return to clinic for any new concerning symptoms or if no improvement.

## 2024-08-10 LAB — URINE CULTURE

## 2024-08-11 ENCOUNTER — Ambulatory Visit (HOSPITAL_COMMUNITY): Payer: Self-pay

## 2024-08-13 ENCOUNTER — Encounter (HOSPITAL_BASED_OUTPATIENT_CLINIC_OR_DEPARTMENT_OTHER): Payer: Self-pay | Admitting: Family Medicine

## 2024-08-14 NOTE — Telephone Encounter (Signed)
 Please see messages sent by pt's spouse and advise.

## 2024-08-22 ENCOUNTER — Ambulatory Visit (HOSPITAL_BASED_OUTPATIENT_CLINIC_OR_DEPARTMENT_OTHER): Admitting: Family Medicine

## 2024-09-01 ENCOUNTER — Emergency Department (HOSPITAL_BASED_OUTPATIENT_CLINIC_OR_DEPARTMENT_OTHER): Admitting: Radiology

## 2024-09-01 ENCOUNTER — Emergency Department (HOSPITAL_BASED_OUTPATIENT_CLINIC_OR_DEPARTMENT_OTHER)
Admission: EM | Admit: 2024-09-01 | Discharge: 2024-09-01 | Disposition: A | Discharge status: Home / Self Care | Attending: Emergency Medicine | Admitting: Emergency Medicine

## 2024-09-01 ENCOUNTER — Encounter (HOSPITAL_BASED_OUTPATIENT_CLINIC_OR_DEPARTMENT_OTHER): Payer: Self-pay | Admitting: *Deleted

## 2024-09-01 ENCOUNTER — Other Ambulatory Visit: Payer: Self-pay

## 2024-09-01 ENCOUNTER — Ambulatory Visit (HOSPITAL_BASED_OUTPATIENT_CLINIC_OR_DEPARTMENT_OTHER): Admitting: Family Medicine

## 2024-09-01 DIAGNOSIS — U071 COVID-19: Secondary | ICD-10-CM | POA: Insufficient documentation

## 2024-09-01 DIAGNOSIS — J0100 Acute maxillary sinusitis, unspecified: Secondary | ICD-10-CM | POA: Insufficient documentation

## 2024-09-01 LAB — RESP PANEL BY RT-PCR (RSV, FLU A&B, COVID)  RVPGX2
Influenza A by PCR: NEGATIVE
Influenza B by PCR: NEGATIVE
Resp Syncytial Virus by PCR: NEGATIVE
SARS Coronavirus 2 by RT PCR: POSITIVE — AB

## 2024-09-01 MED ORDER — DOXYCYCLINE HYCLATE 100 MG PO CAPS: 100.0000 mg | ORAL_CAPSULE | Freq: Two times a day (BID) | ORAL | 0 refills | Status: AC

## 2024-09-01 MED ORDER — PROMETHAZINE-DM 6.25-15 MG/5ML PO SYRP: 5.0000 mL | ORAL_SOLUTION | Freq: Four times a day (QID) | ORAL | 0 refills | Status: AC | PRN

## 2024-09-01 NOTE — ED Notes (Signed)
 To x-ray

## 2024-09-01 NOTE — ED Provider Notes (Signed)
 [place] EMERGENCY DEPARTMENT AT [facility]  Provider Note  CSN: [id number] Arrival date & time: 09/01/24 0245  History Chief Complaint  Patient presents with   Cough    Brittany Fritz is a 38 y.o. female with no significant PMH reports about a week of cough, nasal congestion, sinus pressure and fever. Cough has been productive of green sputum, nasal drainage has also been green. Son recently diagnosed with Covid. She has not been tested.    Home Medications Prior to Admission medications   Medication Sig Start Date End Date Taking? Authorizing Provider  doxycycline  (VIBRAMYCIN ) 100 MG capsule Take 1 capsule (100 mg total) by mouth 2 (two) times daily. 09/01/24  Yes Roselyn Carlin NOVAK, MD  promethazine -dextromethorphan (PROMETHAZINE -DM) 6.25-15 MG/5ML syrup Take 5 mLs by mouth 4 (four) times daily as needed for cough. 09/01/24  Yes Roselyn Carlin NOVAK, MD     Allergies    Lomaira [phentermine hcl], Semaglutide , and Penicillins   Review of Systems   Review of Systems Please see HPI for pertinent positives and negatives  Physical Exam BP (!) 143/99   Pulse 96   Temp 98.1 F (36.7 C) (Oral)   Resp 20   LMP 07/03/2024 (Approximate)   SpO2 100%   Physical Exam Vitals and nursing note reviewed.  Constitutional:      Appearance: Normal appearance.  HENT:     Head: Normocephalic and atraumatic.     Nose: Congestion and rhinorrhea present.     Mouth/Throat:     Mouth: Mucous membranes are moist.  Eyes:     Extraocular Movements: Extraocular movements intact.     Conjunctiva/sclera: Conjunctivae normal.  Cardiovascular:     Rate and Rhythm: Normal rate.  Pulmonary:     Effort: Pulmonary effort is normal.     Breath sounds: Normal breath sounds. No wheezing or rales.  Abdominal:     General: Abdomen is flat.     Palpations: Abdomen is soft.     Tenderness: There is no abdominal tenderness.  Musculoskeletal:        General: No swelling. Normal range of  motion.     Cervical back: Neck supple.  Skin:    General: Skin is warm and dry.  Neurological:     General: No focal deficit present.     Mental Status: She is alert.  Psychiatric:        Mood and Affect: Mood normal.     ED Results / Procedures / Treatments   EKG None  Procedures Procedures  Medications Ordered in the ED Medications - No data to display  Initial Impression and Plan  Patient here with URI symptoms ongoing for a week, now with symptoms suggestive of secondary bacterial sinusitis. Covid/Flu/RSV swab is positive for Covid as expected based on exposure history. I personally viewed the images from radiology studies and agree with radiologist interpretation: CXR is clear. Will treat sinusitis with doxycycline , PhenerganDM for cough, otherwise continue with supportive care at home.   ED Course       MDM Rules/Calculators/A&P Medical Decision Making Problems Addressed: Acute non-recurrent maxillary sinusitis: acute illness or injury COVID-19: acute illness or injury  Amount and/or Complexity of Data Reviewed Labs: ordered. Decision-making details documented in ED Course. Radiology: ordered and independent interpretation performed. Decision-making details documented in ED Course.  Risk Prescription drug management.     Final Clinical Impression(s) / ED Diagnoses Final diagnoses:  COVID-19  Acute non-recurrent maxillary sinusitis    Rx / DC Orders  ED Discharge Orders          Ordered    promethazine -dextromethorphan (PROMETHAZINE -DM) 6.25-15 MG/5ML syrup  4 times daily PRN        09/01/24 0414    doxycycline  (VIBRAMYCIN ) 100 MG capsule  2 times daily        09/01/24 0414             Roselyn Carlin NOVAK, MD 09/01/24 [phone]

## 2024-09-01 NOTE — ED Triage Notes (Addendum)
 Pt states her son was diagnosed with covid one week ago. States that she has had fever on and off for over a week. States prod cough, green in color. States she is not able to sleep. Had some diarrhea initially. States it's hard to get a breath in. Concerned about a lung infection Took tylenol  a few hours pta.

## 2024-09-01 NOTE — ED Notes (Signed)
Patient verbalizes understanding of discharge instructions. Opportunity for questioning and answers were provided. Armband removed by staff, pt discharged from ED. Ambulated out to lobby  

## 2024-09-07 ENCOUNTER — Other Ambulatory Visit (HOSPITAL_COMMUNITY)
Admission: RE | Admit: 2024-09-07 | Discharge: 2024-09-07 | Disposition: A | Discharge status: Home / Self Care | Source: Ambulatory Visit | Attending: Family Medicine | Admitting: Family Medicine

## 2024-09-07 ENCOUNTER — Ambulatory Visit (INDEPENDENT_AMBULATORY_CARE_PROVIDER_SITE_OTHER): Admitting: Family Medicine

## 2024-09-07 ENCOUNTER — Encounter (HOSPITAL_BASED_OUTPATIENT_CLINIC_OR_DEPARTMENT_OTHER): Payer: Self-pay | Admitting: Family Medicine

## 2024-09-07 DIAGNOSIS — E66813 Obesity, class 3: Secondary | ICD-10-CM

## 2024-09-07 DIAGNOSIS — N898 Other specified noninflammatory disorders of vagina: Secondary | ICD-10-CM

## 2024-09-07 DIAGNOSIS — R300 Dysuria: Secondary | ICD-10-CM

## 2024-09-07 DIAGNOSIS — Z6841 Body Mass Index (BMI) 40.0 and over, adult: Secondary | ICD-10-CM

## 2024-09-07 DIAGNOSIS — Z23 Encounter for immunization: Secondary | ICD-10-CM

## 2024-09-07 LAB — POCT URINALYSIS DIP (CLINITEK)
Bilirubin, UA: NEGATIVE
Blood, UA: NEGATIVE
Glucose, UA: NEGATIVE mg/dL
Ketones, POC UA: NEGATIVE mg/dL
Nitrite, UA: NEGATIVE
POC PROTEIN,UA: NEGATIVE
Spec Grav, UA: >=1.03 — AB (ref 1.010–1.025)
Urobilinogen, UA: 0.2 U/dL
pH, UA: 5.5 (ref 5.0–8.0)

## 2024-09-07 MED ORDER — NYSTATIN-TRIAMCINOLONE 100000-0.1 UNIT/GM-% EX OINT: 1.0000 | TOPICAL_OINTMENT | Freq: Two times a day (BID) | CUTANEOUS | 3 refills | Status: AC | PRN

## 2024-09-07 MED ORDER — FLUCONAZOLE 150 MG PO TABS: 150.0000 mg | ORAL_TABLET | Freq: Once | ORAL | 0 refills | Status: AC

## 2024-09-07 NOTE — Progress Notes (Signed)
 Subjective:   Brittany Fritz [birth year] 09/07/2024  Chief Complaint  Patient presents with   Urinary Tract Infection    Pt has been having pain with urination as well as some vaginal itching. Has been to the ED a couple of times recently.    Discussed the use of AI scribe software for clinical note transcription with the patient, who gave verbal consent to proceed.  History of Present Illness Brittany Fritz is a 38 year old female who presents with persistent urinary symptoms and itching.  She has been experiencing dysuria and pruritus for approximately three weeks. The dysuria is severe, located in the back, and associated with a burning sensation. There is a frequent urge to urinate with minimal output. She was previously treated with Bactrim  for an acute urinary tract infection on November 12th, but symptoms have persisted. Her urine was sent for culture, and she recalls being told there were multiple bacteria present.  She reports symptoms suggestive of a yeast infection, including burning and itching. Previous treatments with a single tablet were insufficient, as symptoms would subside temporarily but then recur. She experiences significant discomfort, particularly in warm weather, and has been using a cream as needed for relief.  She is concerned about potential transmission of infections from her husband, who has been treated for an infection affecting his testicles. She inquires about the possibility of her husband being a carrier of infections such as bacterial vaginosis.  WEIGHT:  She discusses concerns about weight management and previous advice to see a dietitian. She is reluctant to use Mounjaro due to potential side effects she read about online. She is seeking alternatives for weight loss and body contouring, particularly for excess fat around her abdomen.She did consult with plastic surgery who recommended weight loss with GLP 1.     The following portions of the  patient's history were reviewed and updated as appropriate: past medical history, past surgical history, family history, social history, allergies, medications, and problem list.   Patient Active Problem List   Diagnosis Date Noted   Panniculitis 06/13/2024   Vitamin D  deficiency 09/23/2023   Iron  deficiency anemia 09/23/2023   Vaginal bleeding in pregnancy, third trimester 07/27/2019   NSVD (normal spontaneous vaginal delivery) 07/27/2019   Hemorrhoids during pregnancy 07/16/2019   Gestational diabetes mellitus 05/22/2019   GBS bacteriuria 03/27/2019   Maternal obesity, antepartum 03/20/2019   Supervision of other normal pregnancy, antepartum 03/03/2019   History of gestational diabetes mellitus (GDM) in prior pregnancy, currently pregnant 12/17/2016   Immigrant with language difficulty 01/23/2016   Past Medical History:  Diagnosis Date   Allergy    Seasonal   BMI 40.0-44.9, adult (HCC)    Chest pain    Gestational diabetes    History of gestational diabetes    Insulin resistance    Medical history non-contributory    Morbid obesity due to excess calories (HCC)    Past Surgical History:  Procedure Laterality Date   NO PAST SURGERIES     Family History  Problem Relation Age of Onset   Heart disease Father    Stroke Father    Outpatient Medications Prior to Visit  Medication Sig Dispense Refill   doxycycline  (VIBRAMYCIN ) 100 MG capsule Take 1 capsule (100 mg total) by mouth 2 (two) times daily. 20 capsule 0   promethazine -dextromethorphan (PROMETHAZINE -DM) 6.25-15 MG/5ML syrup Take 5 mLs by mouth 4 (four) times daily as needed for cough. 118 mL 0   No facility-administered medications prior to  visit.   Allergies[1]   ROS: A complete ROS was performed with pertinent positives/negatives noted in the HPI. The remainder of the ROS are negative.    Objective:   Today's Vitals   09/07/24 0931  BP: 113/82  Pulse: 93  Temp: 97.6 F (36.4 C)  TempSrc: Oral  SpO2: 96%   Weight: 249 lb (112.9 kg)  Height: 5' 3 (1.6 m)    Physical Exam   GENERAL: Well-appearing, in NAD. Well nourished.  SKIN: Pink, warm and dry.  Head: Normocephalic. NECK: Trachea midline. Full ROM w/o pain or tenderness.  RESPIRATORY: Chest wall symmetrical. Respirations even and non-labored. Breath sounds clear to auscultation bilaterally.  CARDIAC: S1, S2 present, regular rate and rhythm without murmur or gallops. Peripheral pulses 2+ bilaterally.  GU: External genitalia without erythema, lesions, or masses. No lymphadenopathy. Vaginal mucosa pink and moist with moderate white discharge. Cervix pink without discharge. Cervical os closed. Uterus and adnexae palpable, not enlarged, and w/o tenderness. No palpable masses. Chaperoned by Damien Many, CMA MSK: Muscle tone and strength appropriate for age. NEUROLOGIC: No motor or sensory deficits. Steady, even gait. C2-C12 intact.  PSYCH/MENTAL STATUS: Alert, oriented x 3. Cooperative, appropriate mood and affect.     Results for orders placed or performed in visit on 09/07/24  POCT URINALYSIS DIP (CLINITEK)  Result Value Ref Range   Color, UA yellow yellow   Clarity, UA clear clear   Glucose, UA negative negative mg/dL   Bilirubin, UA negative negative   Ketones, POC UA negative negative mg/dL   Spec Grav, UA >=8.969 (A) 1.010 - 1.025   Blood, UA negative negative   pH, UA 5.5 5.0 - 8.0   POC PROTEIN,UA negative negative, trace   Urobilinogen, UA 0.2 0.2 or 1.0 E.U./dL   Nitrite, UA Negative Negative   Leukocytes, UA Trace (A) Negative    The ASCVD Risk score (Arnett DK, et al., 2019) failed to calculate for the following reasons:   The 2019 ASCVD risk score is only valid for ages 36 to 41     Assessment & Plan:  1. Class 3 severe obesity due to excess calories without serious comorbidity with body mass index (BMI) of 40.0 to 44.9 in adult [facility]) (Primary) Discussed other options for fat removal and body contouring with  patient and recommend evaluation by plastic surgeon at [facility] plastic surgery.  Referral not needed and contact information provided to patient.  2. Vaginal itching Likely vaginal yeast and possible BV contributing to symptoms.  Will treat with Diflucan  and await cervicovaginal swab swab.  Nystatin  triamcinolone cream provided as well for vaginitis present on exam. - Cervicovaginal ancillary only  3. Dysuria No signs of acute UTI on urinalysis.  Will send for culture and treat appropriately.  Symptoms likely related to yeast and possible BV. - POCT URINALYSIS DIP (CLINITEK) - Urine Culture  4. Encounter for immunization - Flu vaccine trivalent PF, 6mos and older(Flulaval,Afluria,Fluarix,Fluzone)   Meds ordered this encounter  Medications   fluconazole  (DIFLUCAN ) 150 MG tablet    Sig: Take 1 tablet (150 mg total) by mouth once for 1 dose. May repeat after 3 days if needed.    Dispense:  2 tablet    Refill:  0    Supervising Provider:   DE CUBA, RAYMOND J [[id number]]   nystatin -triamcinolone ointment (MYCOLOG)    Sig: Apply 1 Application topically 2 (two) times daily as needed (vaginitis).    Dispense:  30 g    Refill:  3  Supervising Provider:   DE CUBA, RAYMOND J [[id number]]   Lab Orders         Urine Culture         POCT URINALYSIS DIP (CLINITEK)     No images are attached to the encounter or orders placed in the encounter.  Return if symptoms worsen or fail to improve.    Patient to reach out to office if new, worrisome, or unresolved symptoms arise or if no improvement in patient's condition. Patient verbalized understanding and is agreeable to treatment plan. All questions answered to patient's satisfaction.    Thersia Schuyler Stark, FNP     [1]  Allergies Allergen Reactions   Conda [Phentermine Hcl]     ELEVATED BP   Semaglutide  Swelling    Gum and cheek swelling   Penicillins Itching and Rash    Has patient had a PCN reaction causing immediate rash,  facial/tongue/throat swelling, SOB or lightheadedness with hypotension: Yes Has patient had a PCN reaction causing severe rash involving mucus membranes or skin necrosis: No Has patient had a PCN reaction that required hospitalization No Has patient had a PCN reaction occurring within the last 10 years: Yes If all of the above answers are NO, then may proceed with Cephalosporin use.

## 2024-09-07 NOTE — Patient Instructions (Addendum)
 [facility]astic Surgery  Address: [address] [place], [place] [address] Phone: [phone] - Discuss radiofrequency lipolysis for fat treatment

## 2024-09-08 LAB — CERVICOVAGINAL ANCILLARY ONLY
Bacterial Vaginitis (gardnerella): NEGATIVE
Candida Glabrata: NEGATIVE
Candida Vaginitis: POSITIVE — AB
Comment: NEGATIVE
Comment: NEGATIVE
Comment: NEGATIVE

## 2024-09-09 ENCOUNTER — Other Ambulatory Visit: Payer: Self-pay

## 2024-09-09 ENCOUNTER — Emergency Department (HOSPITAL_BASED_OUTPATIENT_CLINIC_OR_DEPARTMENT_OTHER)
Admission: EM | Admit: 2024-09-09 | Discharge: 2024-09-09 | Disposition: A | Discharge status: Home / Self Care | Attending: Emergency Medicine | Admitting: Emergency Medicine

## 2024-09-09 ENCOUNTER — Emergency Department (HOSPITAL_BASED_OUTPATIENT_CLINIC_OR_DEPARTMENT_OTHER)

## 2024-09-09 DIAGNOSIS — R059 Cough, unspecified: Secondary | ICD-10-CM | POA: Insufficient documentation

## 2024-09-09 DIAGNOSIS — U071 COVID-19: Secondary | ICD-10-CM

## 2024-09-09 DIAGNOSIS — R0600 Dyspnea, unspecified: Secondary | ICD-10-CM

## 2024-09-09 DIAGNOSIS — Z8616 Personal history of COVID-19: Secondary | ICD-10-CM | POA: Insufficient documentation

## 2024-09-09 DIAGNOSIS — R0602 Shortness of breath: Secondary | ICD-10-CM | POA: Insufficient documentation

## 2024-09-09 LAB — CBC
HCT: 40.7 % (ref 36.0–46.0)
Hemoglobin: 13.5 g/dL (ref 12.0–15.0)
MCH: 26.2 pg (ref 26.0–34.0)
MCHC: 33.2 g/dL (ref 30.0–36.0)
MCV: 78.9 fL — ABNORMAL LOW (ref 80.0–100.0)
Platelets: 214 K/uL (ref 150–400)
RBC: 5.16 MIL/uL — ABNORMAL HIGH (ref 3.87–5.11)
RDW: 14.8 % (ref 11.5–15.5)
WBC: 7.3 K/uL (ref 4.0–10.5)
nRBC: 0 % (ref 0.0–0.2)

## 2024-09-09 LAB — BASIC METABOLIC PANEL WITH GFR
Anion gap: 11 (ref 5–15)
BUN: 12 mg/dL (ref 6–20)
CO2: 25 mmol/L (ref 22–32)
Calcium: 9.7 mg/dL (ref 8.9–10.3)
Chloride: 101 mmol/L (ref 98–111)
Creatinine, Ser: 0.52 mg/dL (ref 0.44–1.00)
GFR, Estimated: >60 mL/min (ref >60)
Glucose, Bld: 119 mg/dL — ABNORMAL HIGH (ref 70–99)
Potassium: 3.7 mmol/L (ref 3.5–5.1)
Sodium: 138 mmol/L (ref 135–145)

## 2024-09-09 LAB — PREGNANCY, URINE: Preg Test, Ur: NEGATIVE

## 2024-09-09 MED ORDER — ALBUTEROL SULFATE HFA 108 (90 BASE) MCG/ACT IN AERS
2.0000 | INHALATION_SPRAY | RESPIRATORY_TRACT | Status: DC | PRN
  Administered 2024-09-09: 2 via RESPIRATORY_TRACT
  Filled 2024-09-09: qty 6.7

## 2024-09-09 MED ORDER — GUAIFENESIN-CODEINE 100-10 MG/5ML PO SOLN: 10.0000 mL | Freq: Four times a day (QID) | ORAL | 0 refills | Status: AC | PRN

## 2024-09-09 MED ORDER — GUAIFENESIN-CODEINE 100-10 MG/5ML PO SOLN: 10.0000 mL | Freq: Four times a day (QID) | ORAL | 0 refills | Status: DC | PRN

## 2024-09-09 MED ORDER — IOHEXOL 350 MG/ML SOLN
100.0000 mL | Freq: Once | INTRAVENOUS | Status: AC | PRN
  Administered 2024-09-09: 75 mL via INTRAVENOUS

## 2024-09-09 MED ORDER — SODIUM CHLORIDE 0.9 % IV BOLUS
1000.0000 mL | Freq: Once | INTRAVENOUS | Status: AC
  Administered 2024-09-09: 1000 mL via INTRAVENOUS

## 2024-09-09 NOTE — Discharge Instructions (Signed)
 Begin taking Robitussin with codeine  as prescribed as needed for cough.  Use the albuterol  inhaler, 2 puffs every 4 hours as needed for wheezing/difficulty breathing.  Follow-up with primary doctor if symptoms are not improving in the next week.

## 2024-09-09 NOTE — ED Triage Notes (Addendum)
 Pt POV reporting SOB and cough since 12/5, dx covid and bacterial sinusitis, prescribed abx, stopped taking because she feels it is making her worse.

## 2024-09-09 NOTE — ED Provider Notes (Signed)
 [place] EMERGENCY DEPARTMENT AT [facility] Provider Note   CSN: [id number] Arrival date & time: 09/09/24  0200     Patient presents with: Cough and Shortness of Breath   Brittany Fritz is a 38 y.o. female.   Patient is a 38 year old female with history of gestational diabetes.  Patient presenting today with complaints of shortness of breath and cough.  Symptoms have been ongoing for the past 10 days.  She was diagnosed on December 5 with COVID-19.  She was seen here in the ER shortly afterward and was prescribed doxycycline .  Patient returns this evening stating that she feels short of breath and her cough continues.  She also describes feeling warm throughout her body and feeling generally unwell.  She reports receiving a flu shot by her primary doctor on December 11 and believes that she is having some sort of reaction to the flu shot.       Prior to Admission medications  Medication Sig Start Date End Date Taking? Authorizing Provider  doxycycline  (VIBRAMYCIN ) 100 MG capsule Take 1 capsule (100 mg total) by mouth 2 (two) times daily. 09/01/24   Roselyn Carlin NOVAK, MD  nystatin -triamcinolone  ointment (MYCOLOG) Apply 1 Application topically 2 (two) times daily as needed (vaginitis). 09/07/24   Caudle, Thersia Bitters, FNP  promethazine -dextromethorphan (PROMETHAZINE -DM) 6.25-15 MG/5ML syrup Take 5 mLs by mouth 4 (four) times daily as needed for cough. 09/01/24   Roselyn Carlin NOVAK, MD    Allergies: Lomaira [phentermine hcl], Semaglutide , and Penicillins    Review of Systems  All other systems reviewed and are negative.   Updated Vital Signs BP (!) 157/101 (BP Location: Right Arm)   Pulse (!) 117   Temp 97.9 F (36.6 C) (Oral)   Resp 15   LMP 08/17/2024 (Approximate)   SpO2 100%   Physical Exam Vitals and nursing note reviewed.  Constitutional:      General: She is not in acute distress.    Appearance: She is well-developed. She is not diaphoretic.  HENT:      Head: Normocephalic and atraumatic.  Cardiovascular:     Rate and Rhythm: Regular rhythm. Tachycardia present.     Heart sounds: No murmur heard.    No friction rub. No gallop.  Pulmonary:     Effort: Pulmonary effort is normal. No respiratory distress.     Breath sounds: Normal breath sounds. No wheezing.  Abdominal:     General: Bowel sounds are normal. There is no distension.     Palpations: Abdomen is soft.     Tenderness: There is no abdominal tenderness.  Musculoskeletal:        General: Normal range of motion.     Cervical back: Normal range of motion and neck supple.  Skin:    General: Skin is warm and dry.  Neurological:     General: No focal deficit present.     Mental Status: She is alert and oriented to person, place, and time.     (all labs ordered are listed, but only abnormal results are displayed) Labs Reviewed  CBC - Abnormal; Notable for the following components:      Result Value   RBC 5.16 (*)    MCV 78.9 (*)    All other components within normal limits  PREGNANCY, URINE  BASIC METABOLIC PANEL WITH GFR    EKG: None  Radiology: No results found.   Procedures   Medications Ordered in the ED  sodium chloride  0.9 % bolus 1,000 mL (has no  administration in time range)                                    Medical Decision Making Amount and/or Complexity of Data Reviewed Labs: ordered. Radiology: ordered.  Risk Prescription drug management.   Patient is a 38 year old female presenting with ongoing cough and shortness of breath for the past week.  She had a positive COVID test on December 5, then was prescribed antibiotics for superimposed infection, but is not getting better.  Patient arrives here with stable vital signs and is afebrile.  She is tachycardic with a heart rate in the 110-120 range, but no hypoxia.  Physical examination is unremarkable.  Laboratory studies obtained including CBC and basic metabolic panel, both of which are  unremarkable.  Given recent COVID and tachycardia this evening, I did obtain a CTA of the chest showing no evidence for pulmonary embolism or other cardiopulmonary abnormality.  Patient is concerned that receiving a flu shot 2 days ago has caused her complications.  She does have tachycardia, the etiology of which I am uncertain, but does not appear uncomfortable.  There is no evidence for PE and no fever to suggest sepsis/SIRS.  At this point, I feel as though she can safely be discharged.  I will prescribe an albuterol  inhaler at her request.  She is also asking for more cough medicine and will prescribe this as well.  Patient to follow-up with her primary doctor if symptoms persist.     Final diagnoses:  None    ED Discharge Orders     None          Geroldine Berg, MD 09/09/24 [phone]

## 2024-09-10 ENCOUNTER — Ambulatory Visit (HOSPITAL_BASED_OUTPATIENT_CLINIC_OR_DEPARTMENT_OTHER): Payer: Self-pay | Admitting: Family Medicine

## 2024-09-10 LAB — URINE CULTURE

## 2024-09-10 NOTE — Progress Notes (Signed)
 Vaginal swab positive for yeast. Diflucan  and nystatin  cream previously sent in. Urine culture unremarkable for UTI.

## 2024-09-14 ENCOUNTER — Encounter (HOSPITAL_BASED_OUTPATIENT_CLINIC_OR_DEPARTMENT_OTHER): Payer: Self-pay | Admitting: Family Medicine

## 2024-10-31 ENCOUNTER — Encounter (HOSPITAL_BASED_OUTPATIENT_CLINIC_OR_DEPARTMENT_OTHER): Payer: Self-pay | Admitting: Family Medicine

## 2024-10-31 NOTE — Telephone Encounter (Signed)
 Please see mychart message sent by pt and advise.

## 2025-04-10 ENCOUNTER — Encounter (HOSPITAL_BASED_OUTPATIENT_CLINIC_OR_DEPARTMENT_OTHER): Admitting: Family Medicine
